# Patient Record
Sex: Male | Born: 1965 | Race: Black or African American | Hispanic: No | Marital: Single | Smoking: Current every day smoker
Health system: Southern US, Community
[De-identification: ages and names within clinical notes are randomized; demographics above are authoritative.]

## PROBLEM LIST (undated history)

## (undated) DIAGNOSIS — N189 Chronic kidney disease, unspecified: Secondary | ICD-10-CM

## (undated) DIAGNOSIS — F329 Major depressive disorder, single episode, unspecified: Secondary | ICD-10-CM

## (undated) DIAGNOSIS — IMO0001 Reserved for inherently not codable concepts without codable children: Secondary | ICD-10-CM

## (undated) DIAGNOSIS — D496 Neoplasm of unspecified behavior of brain: Secondary | ICD-10-CM

## (undated) DIAGNOSIS — G4730 Sleep apnea, unspecified: Secondary | ICD-10-CM

## (undated) DIAGNOSIS — J45909 Unspecified asthma, uncomplicated: Secondary | ICD-10-CM

## (undated) DIAGNOSIS — E785 Hyperlipidemia, unspecified: Secondary | ICD-10-CM

## (undated) DIAGNOSIS — K279 Peptic ulcer, site unspecified, unspecified as acute or chronic, without hemorrhage or perforation: Secondary | ICD-10-CM

## (undated) DIAGNOSIS — F32A Depression, unspecified: Secondary | ICD-10-CM

## (undated) DIAGNOSIS — S0990XA Unspecified injury of head, initial encounter: Secondary | ICD-10-CM

## (undated) DIAGNOSIS — C801 Malignant (primary) neoplasm, unspecified: Secondary | ICD-10-CM

## (undated) DIAGNOSIS — K219 Gastro-esophageal reflux disease without esophagitis: Secondary | ICD-10-CM

## (undated) DIAGNOSIS — K5900 Constipation, unspecified: Secondary | ICD-10-CM

## (undated) DIAGNOSIS — F1910 Other psychoactive substance abuse, uncomplicated: Secondary | ICD-10-CM

## (undated) DIAGNOSIS — M1990 Unspecified osteoarthritis, unspecified site: Secondary | ICD-10-CM

## (undated) DIAGNOSIS — I639 Cerebral infarction, unspecified: Secondary | ICD-10-CM

## (undated) DIAGNOSIS — R569 Unspecified convulsions: Secondary | ICD-10-CM

## (undated) DIAGNOSIS — D573 Sickle-cell trait: Secondary | ICD-10-CM

## (undated) DIAGNOSIS — F419 Anxiety disorder, unspecified: Secondary | ICD-10-CM

## (undated) DIAGNOSIS — R519 Headache, unspecified: Secondary | ICD-10-CM

## (undated) HISTORY — DX: Headache, unspecified: R51.9

## (undated) HISTORY — DX: Unspecified convulsions: R56.9

## (undated) HISTORY — DX: Cerebral infarction, unspecified: I63.9

## (undated) HISTORY — DX: Sleep apnea, unspecified: G47.30

## (undated) HISTORY — DX: Hyperlipidemia, unspecified: E78.5

## (undated) HISTORY — PX: DIAGNOSTIC LAPAROSCOPY: SUR761

## (undated) HISTORY — PX: ESOPHAGOGASTRODUODENOSCOPY ENDOSCOPY: SHX5814

## (undated) HISTORY — DX: Chronic kidney disease, unspecified: N18.9

## (undated) HISTORY — DX: Sickle-cell trait: D57.3

## (undated) HISTORY — DX: Gastro-esophageal reflux disease without esophagitis: K21.9

## (undated) HISTORY — DX: Other psychoactive substance abuse, uncomplicated: F19.10

## (undated) HISTORY — DX: Unspecified osteoarthritis, unspecified site: M19.90

## (undated) HISTORY — DX: Malignant (primary) neoplasm, unspecified: C80.1

---

## 2003-06-16 ENCOUNTER — Inpatient Hospital Stay (HOSPITAL_COMMUNITY): Admission: EM | Admit: 2003-06-16 | Discharge: 2003-06-21 | Payer: Self-pay | Admitting: Psychiatry

## 2004-12-03 ENCOUNTER — Ambulatory Visit: Payer: Self-pay | Admitting: *Deleted

## 2004-12-03 ENCOUNTER — Ambulatory Visit: Payer: Self-pay | Admitting: Nurse Practitioner

## 2004-12-17 ENCOUNTER — Ambulatory Visit: Payer: Self-pay | Admitting: Nurse Practitioner

## 2004-12-31 ENCOUNTER — Ambulatory Visit (HOSPITAL_COMMUNITY): Admission: RE | Admit: 2004-12-31 | Discharge: 2004-12-31 | Payer: Self-pay | Admitting: Internal Medicine

## 2004-12-31 ENCOUNTER — Ambulatory Visit: Payer: Self-pay | Admitting: Nurse Practitioner

## 2005-01-14 ENCOUNTER — Ambulatory Visit: Payer: Self-pay | Admitting: Nurse Practitioner

## 2007-11-25 ENCOUNTER — Emergency Department (HOSPITAL_COMMUNITY): Admission: EM | Admit: 2007-11-25 | Discharge: 2007-11-25 | Payer: Self-pay | Admitting: Emergency Medicine

## 2009-10-22 ENCOUNTER — Emergency Department (HOSPITAL_COMMUNITY): Admission: EM | Admit: 2009-10-22 | Discharge: 2009-10-22 | Payer: Self-pay | Admitting: Emergency Medicine
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
## 2011-03-19 NOTE — H&P (Signed)
NAME:  Jeremy Soto, Jeremy Soto                         ACCOUNT NO.:  [id number]   MEDICAL RECORD NO.:  [id number]                   PATIENT TYPE:  IPS   LOCATION:  0303                                 FACILITY:  [facility]   PHYSICIAN:  Geoffery Lyons, M.D.                   DATE OF BIRTH:  1966/07/10   DATE OF ADMISSION:  06/16/2003  DATE OF DISCHARGE:                         PSYCHIATRIC ADMISSION ASSESSMENT   IDENTIFYING INFORMATION:  This is a 45 year old African-American male  voluntarily admitted on June 16, 2003.   HISTORY OF PRESENT ILLNESS:  The patient presents with a history of  substance abuse, having homicidal thoughts towards someone that was  disrespecting his home.  He states he got very angry.  He states he does not  like any of this bullshit.  He works nights.  He states he drinks the  moment he gets home, smoking joints and doing cocaine.  He feels very angry,  having occasional positive auditory hallucinations that sometimes settle him  down.  He states that they talk about the Bible.  He states he has not been  sleeping.  He has lost over 20 pounds.  He denies any current psychosis and  denies any suicidal ideation.   PAST PSYCHIATRIC HISTORY:  First hospitalization to [facility].  No other psychiatric admissions.  No outpatient treatment.  No  history of alcohol detox in the past.  No prior suicide attempt.   SOCIAL HISTORY:  This is a 45 year old divorced African-American male.  He  has been divorced for eight months.  This was his first marriage.  He has  two girls, ages 50 and 43.  His 45 year old is currently with the biological  mother.  He lives alone.  He works at [employer].  No legal problems.   FAMILY HISTORY:  [name], he states, used to talk to himself.   ALCOHOL/DRUG HISTORY:  The patient smokes.  He drinks beer and liquor.  His  last drink was on Sunday.  No blackouts.  He did smoke marijuana and used  cocaine in the past.   PRIMARY  CARE PHYSICIAN:  None.   MEDICAL PROBLEMS:  None.   MEDICATIONS:  None.   ALLERGIES:  No known allergies.   PHYSICAL EXAMINATION:  On the chart.  The patient is a well-developed, well-  nourished male without any complaints.  He is in general good health.  Vital  signs are stable.   LABORATORY DATA:  CBC within normal limits.  CMET within normal limits.   MENTAL STATUS EXAM:  He is an alert, well-built male.  Cooperative with good  eye contact.  Speech is clear.  Mood is angry and agitated. Affect is  constricted.  Thought processes are positive auditory hallucinations,  although none currently.  Does not appear to be responding to internal  stimuli.  Positive homicidal ideation.  No suicidal ideation.  No delusions  or  flight of ideas.  Some questionable religious preoccupation.  Cognitive  function intact.  Memory is fair.  Judgment and insight are poor.  Poor  impulse control.   DIAGNOSES:   AXIS I:  1. Depression not otherwise specified.  2. Polysubstance abuse.  3. Rule out bipolar disorder.  4. Rule out substance-induced mood disorder.   AXIS II:  Deferred.   AXIS III:  None.   AXIS IV:  Psychosocial problems related to drug use.   AXIS V:  Current 25; past year 49.   PLAN:  Voluntary admission for polysubstance abuse.  Contract for safety.  Check every 15 minutes.  Will stabilize mood and thinking so patient and  others can be safe.  Will initiate Depakote as a mood stabilizer.  Seroquel  for anxiety control, augmenting Depakote for mood stabilization and  psychotic symptoms.  Will have phenobarbital to detox safely from alcohol.  The patient is to remain alcohol and drug-free, to follow up with mental  health and to be medication-compliant.   TENTATIVE LENGTH OF STAY:  Three to five days or more depending on patient's  response to medication.     Landry Corporal, N.P.                       Geoffery Lyons, M.D.    JO/MEDQ  D:  06/21/2003  T:  06/22/2003  Job:   [id number]

## 2012-06-27 ENCOUNTER — Encounter (HOSPITAL_COMMUNITY): Payer: Self-pay | Admitting: Physical Medicine and Rehabilitation

## 2012-06-27 ENCOUNTER — Emergency Department (HOSPITAL_COMMUNITY)
Admission: EM | Admit: 2012-06-27 | Discharge: 2012-06-27 | Disposition: A | Discharge status: Home / Self Care | Payer: BC Managed Care – PPO | Attending: Emergency Medicine | Admitting: Emergency Medicine

## 2012-06-27 DIAGNOSIS — S0100XA Unspecified open wound of scalp, initial encounter: Secondary | ICD-10-CM | POA: Insufficient documentation

## 2012-06-27 DIAGNOSIS — Z23 Encounter for immunization: Secondary | ICD-10-CM | POA: Insufficient documentation

## 2012-06-27 DIAGNOSIS — W298XXA Contact with other powered powered hand tools and household machinery, initial encounter: Secondary | ICD-10-CM | POA: Insufficient documentation

## 2012-06-27 DIAGNOSIS — S0101XA Laceration without foreign body of scalp, initial encounter: Secondary | ICD-10-CM

## 2012-06-27 MED ORDER — OXYCODONE-ACETAMINOPHEN 5-325 MG PO TABS
1.0000 | ORAL_TABLET | Freq: Once | ORAL | Status: AC
  Administered 2012-06-27: 1 via ORAL
  Filled 2012-06-27: qty 1

## 2012-06-27 MED ORDER — HYDROCODONE-ACETAMINOPHEN 5-325 MG PO TABS: 1.0000 | ORAL_TABLET | Freq: Four times a day (QID) | ORAL | Status: DC | PRN

## 2012-06-27 MED ORDER — TETANUS-DIPHTH-ACELL PERTUSSIS 5-2.5-18.5 LF-MCG/0.5 IM SUSP
0.5000 mL | Freq: Once | INTRAMUSCULAR | Status: AC
  Administered 2012-06-27: 0.5 mL via INTRAMUSCULAR
  Filled 2012-06-27: qty 0.5

## 2012-06-27 NOTE — ED Provider Notes (Signed)
I saw and evaluated the patient, reviewed the resident's note and I agree with the findings and plan.   Naketa Daddario, MD 06/27/12 1631 
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
## 2012-06-27 NOTE — ED Notes (Signed)
Resident at the bedside to place staples. Pt tolerated without difficulty.

## 2012-07-05 ENCOUNTER — Encounter (HOSPITAL_COMMUNITY): Payer: Self-pay | Admitting: *Deleted

## 2012-07-05 ENCOUNTER — Emergency Department (HOSPITAL_COMMUNITY)
Admission: EM | Admit: 2012-07-05 | Discharge: 2012-07-05 | Disposition: A | Discharge status: Home / Self Care | Payer: BC Managed Care – PPO | Attending: Emergency Medicine | Admitting: Emergency Medicine

## 2012-07-05 DIAGNOSIS — Z4802 Encounter for removal of sutures: Secondary | ICD-10-CM | POA: Insufficient documentation

## 2012-07-05 NOTE — ED Provider Notes (Signed)
History     CSN: [id number]  Arrival date & time 07/05/12  1851   First MD Initiated Contact with Patient 07/05/12 2057      Chief Complaint  Patient presents with  . Suture / Staple Removal    (Consider location/radiation/quality/duration/timing/severity/associated sxs/prior treatment) Patient is a 46 y.o. male presenting with suture removal. The history is provided by the patient.  Suture / Staple Removal  The sutures were placed 7 to 10 days ago. There has been no treatment since the wound repair. Fever duration: afebrile. There has been no drainage from the wound. There is no redness present. There is no swelling present. The pain has no pain.    History reviewed. No pertinent past medical history.  History reviewed. No pertinent past surgical history.  History reviewed. No pertinent family history.  History  Substance Use Topics  . Smoking status: Never Smoker   . Smokeless tobacco: Not on file  . Alcohol Use: Yes     occassional      Review of Systems  Constitutional: Negative for fever, diaphoresis and activity change.  HENT: Negative for congestion and neck pain.   Respiratory: Negative for cough.   Genitourinary: Negative for dysuria.  Musculoskeletal: Negative for myalgias.  Skin: Negative for color change and wound.  Neurological: Negative for headaches.  All other systems reviewed and are negative.    Allergies  Review of patient's allergies indicates no known allergies.  Home Medications   Current Outpatient Rx  Name Route Sig Dispense Refill  . VITAMIN C PO Oral Take 1 tablet by mouth daily.    . OMEGA-3 FATTY ACIDS 1000 MG PO CAPS Oral Take 1 g by mouth daily.    Marland Kitchen HYDROCODONE-ACETAMINOPHEN 5-325 MG PO TABS Oral Take 1 tablet by mouth every 6 (six) hours as needed. For pain    . VITAMIN E PO Oral Take 1 capsule by mouth daily.      BP 131/80  Pulse 73  Temp 98.5 F (36.9 C) (Oral)  Resp 16  SpO2 100%  Physical Exam  Constitutional: He  appears well-developed and well-nourished. No distress.  HENT:  Head: Normocephalic.  Eyes: Conjunctivae and EOM are normal. Pupils are equal, round, and reactive to light.  Neck: Normal range of motion. Neck supple.  Cardiovascular: Normal rate.        Intact distal pulses  Pulmonary/Chest: Effort normal.  Skin: Skin is dry. He is not diaphoretic.       Wound healing appropriately with good approximation. No surrounding warmth or erythema. No purulent drainage. Mild ttp along scar line.    ED Course  SUTURE REMOVAL Date/Time: 07/05/2012 9:08 PM Performed by: Jaci Carrel Authorized by: Jaci Carrel Consent: Verbal consent obtained. Written consent obtained. Risks and benefits: risks, benefits and alternatives were discussed Consent given by: patient Patient understanding: patient states understanding of the procedure being performed Patient consent: the patient's understanding of the procedure matches consent given Patient identity confirmed: verbally with patient Body area: head/neck Location details: scalp Wound Appearance: clean Staples Removed: 7 Post-removal: antibiotic ointment applied Facility: sutures placed in this facility Patient tolerance: Patient tolerated the procedure well with no immediate complications.   (including critical care time)  Labs Reviewed - No data to display No results found.   No diagnosis found.    MDM  Staple removal   Pt to ER for staple/suture removal and wound check as above. Procedure tolerated well. Vitals normal, no signs of infection. Scar minimization & return precautions  given at dc.         Jaci Carrel, [place] 07/05/12 2109
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
## 2012-07-06 NOTE — ED Provider Notes (Signed)
Medical screening examination/treatment/procedure(s) were performed by non-physician practitioner and as supervising physician I was immediately available for consultation/collaboration.  Jones Skene, M.D.     Jones Skene, MD 07/06/12 1236

## 2012-09-11 ENCOUNTER — Ambulatory Visit (INDEPENDENT_AMBULATORY_CARE_PROVIDER_SITE_OTHER): Payer: BC Managed Care – PPO | Admitting: Emergency Medicine

## 2012-09-11 VITALS — BP 152/78 | HR 79 | Temp 98.5°F | Resp 18 | Ht 74.0 in | Wt 223.0 lb

## 2012-09-11 DIAGNOSIS — S335XXA Sprain of ligaments of lumbar spine, initial encounter: Secondary | ICD-10-CM

## 2012-09-11 MED ORDER — CYCLOBENZAPRINE HCL 10 MG PO TABS: 10.0000 mg | ORAL_TABLET | Freq: Three times a day (TID) | ORAL | Status: DC | PRN

## 2012-09-11 MED ORDER — NAPROXEN SODIUM 550 MG PO TABS: 550.0000 mg | ORAL_TABLET | Freq: Two times a day (BID) | ORAL | Status: DC

## 2012-09-11 MED ORDER — TRAMADOL HCL 50 MG PO TABS: 50.0000 mg | ORAL_TABLET | Freq: Four times a day (QID) | ORAL | Status: DC | PRN
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
## 2012-09-12 ENCOUNTER — Telehealth: Payer: Self-pay

## 2012-09-12 NOTE — Telephone Encounter (Signed)
Pt lost his rtw notice and needs to have another rtw note written but would like to see if dr can write note to out till Thursday 09-14-12 he wasn't able to make it to the er and its hard finding a ride, he states he needs his job and is at the bus station now waiting to get on bus. Pt would like a call when note is ready for pick-up. [phone]

## 2012-09-14 NOTE — Progress Notes (Signed)
Reviewed and agree.

## 2013-04-03 ENCOUNTER — Encounter: Payer: Self-pay | Admitting: Gastroenterology

## 2013-04-12 ENCOUNTER — Ambulatory Visit: Payer: BC Managed Care – PPO | Admitting: Gastroenterology

## 2013-04-12 ENCOUNTER — Ambulatory Visit: Payer: BC Managed Care – PPO

## 2013-04-13 ENCOUNTER — Ambulatory Visit: Payer: BC Managed Care – PPO | Attending: Family Medicine | Admitting: Family Medicine

## 2013-04-13 VITALS — BP 119/76 | HR 62 | Temp 98.8°F | Resp 17 | Wt 212.0 lb

## 2013-04-13 DIAGNOSIS — K219 Gastro-esophageal reflux disease without esophagitis: Secondary | ICD-10-CM | POA: Diagnosis not present

## 2013-04-13 DIAGNOSIS — R109 Unspecified abdominal pain: Secondary | ICD-10-CM | POA: Insufficient documentation

## 2013-04-13 DIAGNOSIS — R1013 Epigastric pain: Secondary | ICD-10-CM | POA: Diagnosis not present

## 2013-04-13 DIAGNOSIS — K279 Peptic ulcer, site unspecified, unspecified as acute or chronic, without hemorrhage or perforation: Secondary | ICD-10-CM | POA: Diagnosis not present

## 2013-04-13 LAB — CBC WITH DIFFERENTIAL/PLATELET
Basophils Absolute: 0 10*3/uL (ref 0.0–0.1)
HCT: 37.1 % — ABNORMAL LOW (ref 39.0–52.0)
Lymphs Abs: 1.7 10*3/uL (ref 0.7–4.0)
MCH: 30.4 pg (ref 26.0–34.0)
MCHC: 34.8 g/dL (ref 30.0–36.0)
Neutrophils Relative %: 49 % (ref 43–77)
RBC: 4.24 MIL/uL (ref 4.22–5.81)

## 2013-04-13 LAB — COMPREHENSIVE METABOLIC PANEL
Alkaline Phosphatase: 71 U/L (ref 39–117)
BUN: 12 mg/dL (ref 6–23)
CO2: 28 mEq/L (ref 19–32)
Calcium: 9.4 mg/dL (ref 8.4–10.5)
Chloride: 106 mEq/L (ref 96–112)
Creat: 1.5 mg/dL — ABNORMAL HIGH (ref 0.50–1.35)
Potassium: 4 mEq/L (ref 3.5–5.3)
Sodium: 141 mEq/L (ref 135–145)
Total Bilirubin: 0.8 mg/dL (ref 0.3–1.2)

## 2013-04-13 NOTE — Progress Notes (Signed)
Patient complains of having stomach pain Was seen June 5th  At [facility] Was given a prescription for amoxicillin and biaxin which he has not yet gotten Filled.  Has a GI appt scheduled for June 19th

## 2013-04-13 NOTE — Patient Instructions (Signed)
Ulcer Disease You have an ulcer. This may be in your stomach (gastric ulcer) or in the first part of your small bowel, the duodenum (duodenal ulcer). An ulcer is a break in the lining of the stomach or duodenum. The ulcer causes erosion into the deeper tissue. CAUSES  The stomach has a lining to protect itself from the acid that digests food. The lining can be damaged in two main ways:  The Helico Pylori bacteria (H. Pyolori) can infect the lining of the stomach and cause ulcers.  Nonsteroidal, anti-inflammatory medications (NSAIDS) can cause gastric ulcerations.  Smoking tobacco can increase the acid in the stomach. This can lead to ulcers, and will impair healing of ulcers. Other factors, such as alcohol use and stress may contribute to ulcer formation. Rarely, a tumor or cancer can cause an ulcer.  SYMPTOMS  The problems (symptoms) of ulcer disease are usually a burning or gnawing of the mid-upper belly (abdomen). This is often worse on an empty stomach and may get better with food. This may be associated with feeling sick to your stomach (nausea), bloating, and vomiting. If the ulcer results in bleeding, it can cause:  Black, tarry stools.  Vomiting of bright red blood.  Vomiting coffee-ground-looking materials. With severe bleeding, there may be loss of consciousness and shock.  DIAGNOSIS  Learning what is wrong (diagnosis) is usually made based upon your history and an exam. Medications are so effective that further tests may not be necessary. If needed, other tests may include:  Blood tests, x-rays, or heart tests. These are done to be sure that no other conditions are causing your symptoms.  X-rays (Barium studies) such as an upper GI series.  Most commonly, an upper GI endoscopy can confirm your diagnosis. This is when a flexible tube is passed through the mouth (using sedative medication). The tube is used to look at the inside of esophagus, stomach, and small bowel. Abnormal  pieces of tissue may be removed to examine under the microscope (biopsy). TREATMENT  Bleeding from ulcers can usually be treated via endoscopy. Rarely, surgery is needed for ulcers when bleeding cannot be stopped. Surgery is needed if the ulcer goes through the wall of the stomach or duodenum.  After any bleeding is stopped, medications are the main treatment:  If your ulcer was caused by the bacteria H. Pylori, then you will need antibiotics to kill the infection. Usually, a program involving more than one antibiotic, and a medicine for stomach acid, is prescribed.  Medicine to decrease acid production is used for almost all ulcers. Your caregiver will make a recommendation. They will also tell you how long you must use the medication.  Stop using any medications or substances that may have contributed to your ulcer (alcohol, tobacco, caffeine, for example).  Medications are available that protect the lining of the bowel. HOME CARE INSTRUCTIONS   Continue regular work and usual activities unless advised otherwise by your caregiver.  Avoid tobacco, alcohol, and caffeine. Tobacco use will decrease and slow the rates of healing.  Avoid foods that seem to aggravate or cause discomfort.  There are many over-the-counter products available to control stomach acid and other symptoms. Discuss these with your caregiver before using them. DO NOT substitute over-the-counter medications for prescription medications without discussing with your caregiver.  Special diets are not usually needed.  Keep any follow-up appointments and blood tests, as directed. SEEK MEDICAL CARE IF:   Your pain or other ulcer symptoms do not improve within a  few days of starting treatment.  You develop diarrhea. This can be a complication of certain treatments.  You have ongoing indigestion or heartburn, even if your main ulcer symptoms are improved. SEEK IMMEDIATE MEDICAL CARE IF:   You develop bright red, rectal  bleeding; dark black, tarry stools; or vomit blood.  You become light-headed, weak, have fainting episodes, or become sweaty, cold and clammy.  You experience severe abdominal pain not controlled by medications. DO NOT take pain medications unless ordered by your caregiver. Document Released: 07/28/2005 Document Revised: 01/10/2012 Document Reviewed: 06/15/2007 [facility] Patient Information 2013 [place], [place]. Diet for Gastroesophageal Reflux Disease, Adult Reflux is when stomach acid flows up into the esophagus. The esophagus becomes irritated and sore (inflammation). When reflux happens often and is severe, it is called gastroesophageal reflux disease (GERD). What you eat can help ease any discomfort caused by GERD. FOODS OR DRINKS TO AVOID OR LIMIT  Coffee and black tea, with or without caffeine.  Bubbly (carbonated) drinks with caffeine or energy drinks.  Strong spices, such as pepper, cayenne pepper, curry, or chili powder.  Peppermint or spearmint.  Chocolate.  High-fat foods, such as meats, fried food, oils, butter, or nuts.  Fruits and vegetables that cause discomfort. This includes citrus fruits and tomatoes.  Alcohol. If a certain food or drink irritates your GERD, avoid eating or drinking it. THINGS THAT MAY HELP GERD INCLUDE:  Eat meals slowly.  Eat 5 to 6 small meals a day, not 3 large meals.  Do not eat food for a certain amount of time if it causes discomfort.  Wait 3 hours after eating before lying down.  Keep the head of your bed raised 6 to 9 inches (15 23 centimeters). Put a foam wedge or blocks under the legs of the bed.  Stay active. Weight loss, if needed, may help ease your discomfort.  Wear loose-fitting clothing.  Do not smoke or chew tobacco. Document Released: 04/18/2012 Document Reviewed: 04/18/2012 [facility] Patient Information 2014 [place], [place].

## 2013-04-13 NOTE — Progress Notes (Signed)
Patient ID: Jeremy Soto, male   DOB: 28-Jun-1966, 47 y.o.   MRN: [id number]  CC: establish  HPI: Pt reports 1 month of worsening abd pain, nausea and diarrhea, sour taste in mouth He saw urgent care who started him on omerazole.  He is scheduled to see GI next week.  Pt says he has lost weight and not eating well because of pain with eating.   Abd bloating has been present. No Cp, no SOB  No Known Allergies Past Medical History  Diagnosis Date  . GERD (gastroesophageal reflux disease)    Current Outpatient Prescriptions on File Prior to Visit  Medication Sig Dispense Refill  . Ascorbic Acid (VITAMIN C PO) Take 1 tablet by mouth daily.      . cyclobenzaprine (FLEXERIL) 10 MG tablet Take 1 tablet (10 mg total) by mouth 3 (three) times daily as needed for muscle spasms.  30 tablet  0  . fish oil-omega-3 fatty acids 1000 MG capsule Take 1 g by mouth daily.      Marland Kitchen HYDROcodone-acetaminophen (NORCO/VICODIN) 5-325 MG per tablet Take 1 tablet by mouth every 6 (six) hours as needed. For pain      . Loperamide-Simethicone (IMODIUM ADVANCED) 2-125 MG TABS Take by mouth.      . naproxen sodium (ANAPROX DS) 550 MG tablet Take 1 tablet (550 mg total) by mouth 2 (two) times daily with a meal.  40 tablet  0  . omeprazole (PRILOSEC) 20 MG capsule Take 20 mg by mouth 2 (two) times daily.      . ondansetron (ZOFRAN) 8 MG tablet Take by mouth every 8 (eight) hours as needed for nausea.      . traMADol (ULTRAM) 50 MG tablet Take 1-2 tablets (50-100 mg total) by mouth every 6 (six) hours as needed for pain.  50 tablet  0  . VITAMIN E PO Take 1 capsule by mouth daily.       No current facility-administered medications on file prior to visit.   Family History  Problem Relation Age of Onset  . Diabetes Maternal Grandmother   . Diabetes Paternal Grandfather   . Hypertension Paternal Grandfather   . Hypertension Father   . Hypertension Mother    History   Social History  . Marital Status: Single     Spouse Name: N/A    Number of Children: N/A  . Years of Education: N/A   Occupational History  . Not on file.   Social History Main Topics  . Smoking status: Current Every Day Smoker  . Smokeless tobacco: Never Used     Comment: 1/2 pack daily  . Alcohol Use: 0.0 oz/week     Comment: 1-2 drinks per day   . Drug Use: No  . Sexually Active: Not on file   Other Topics Concern  . Not on file   Social History Narrative  . No narrative on file    Review of Systems  Constitutional: Negative for fever, chills, diaphoresis, activity change, appetite change and fatigue.  HENT: Negative for ear pain, nosebleeds, congestion, facial swelling, rhinorrhea, neck pain, neck stiffness and ear discharge.   Eyes: Negative for pain, discharge, redness, itching and visual disturbance.  Respiratory: Negative for cough, choking, chest tightness, shortness of breath, wheezing and stridor.   Cardiovascular: Negative for chest pain, palpitations and leg swelling.  Gastrointestinal: Negative for abdominal distention.  Genitourinary: Negative for dysuria, urgency, frequency, hematuria, flank pain, decreased urine volume, difficulty urinating and dyspareunia.  Musculoskeletal: Negative for  back pain, joint swelling, arthralgias and gait problem.  Neurological: Negative for dizziness, tremors, seizures, syncope, facial asymmetry, speech difficulty, weakness, light-headedness, numbness and headaches.  Hematological: Negative for adenopathy. Does not bruise/bleed easily.  Psychiatric/Behavioral: Negative for hallucinations, behavioral problems, confusion, dysphoric mood, decreased concentration and agitation.    Objective:   Filed Vitals:   04/13/13 1201  BP: 119/76  Pulse: 62  Temp: 98.8 F (37.1 C)  Resp: 17    Physical Exam  Constitutional: Appears well-developed and well-nourished. No distress.  HENT: Normocephalic. External right and left ear normal. Oropharynx is clear and moist.  Eyes:  Conjunctivae and EOM are normal. PERRLA, no scleral icterus.  Neck: Normal ROM. Neck supple. No JVD. No tracheal deviation. No thyromegaly.  CVS: RRR, S1/S2 +, no murmurs, no gallops, no carotid bruit.  Pulmonary: Effort and breath sounds normal, no stridor, rhonchi, wheezes, rales.  Abdominal: Soft. BS +,  no distension, tenderness, rebound or guarding.  Musculoskeletal: Normal range of motion. No edema and no tenderness.  Lymphadenopathy: No lymphadenopathy noted, cervical, inguinal. Neuro: Alert. Normal reflexes, muscle tone coordination. No cranial nerve deficit. Skin: Skin is warm and dry. No rash noted. Not diaphoretic. No erythema. No pallor.  Psychiatric: Normal mood and affect. Behavior, judgment, thought content normal.   No results found for this basename: WBC, HGB, HCT, MCV, PLT   No results found for this basename: CREATININE, BUN, NA, K, CL, CO2    No results found for this basename: HGBA1C   Lipid Panel  No results found for this basename: chol, trig, hdl, cholhdl, vldl, ldlcalc      Assessment and plan:   Patient Active Problem List   Diagnosis Date Noted  . Peptic ulcer disease 04/13/2013  . GERD (gastroesophageal reflux disease) 04/13/2013  . Abdominal pain, epigastric 04/13/2013   Xray of abdomen ordered GI referral for June 19 already scheduled GERD diet recommended  The patient was counseled on the dangers of tobacco use, and was advised to quit.  Reviewed strategies to maximize success, including removing cigarettes and smoking materials from environment and stress management  STOP ALL NSAIDS recommended  Follow up in 3 weeks  The patient was given clear instructions to go to ER or return to medical center if symptoms don't improve, worsen or new problems develop.  The patient verbalized understanding.  The patient was told to call to get lab results if they haven't heard anything in the next week.    Rodney Langton, MD, CDE, FAAFP [facility] [place], [place]  .

## 2013-04-18 ENCOUNTER — Telehealth: Payer: Self-pay | Admitting: *Deleted

## 2013-04-18 NOTE — Telephone Encounter (Signed)
04/18/13 Spoke with patient regarding lad results mild anemia already seeing GI doctor Has slightly high creatinine . Patient stated he not aware of any kidney problems will Discuss with doctor on next f/u appointment. P.Bo Teicher,RN BSN [facility]

## 2013-04-19 ENCOUNTER — Encounter: Payer: Self-pay | Admitting: Gastroenterology

## 2013-04-19 ENCOUNTER — Ambulatory Visit (INDEPENDENT_AMBULATORY_CARE_PROVIDER_SITE_OTHER): Payer: BC Managed Care – PPO | Admitting: Gastroenterology

## 2013-04-19 VITALS — BP 106/76 | HR 80 | Ht 73.0 in | Wt 210.4 lb

## 2013-04-19 DIAGNOSIS — A048 Other specified bacterial intestinal infections: Secondary | ICD-10-CM

## 2013-04-19 DIAGNOSIS — R112 Nausea with vomiting, unspecified: Secondary | ICD-10-CM

## 2013-04-19 DIAGNOSIS — R1013 Epigastric pain: Secondary | ICD-10-CM

## 2013-04-19 MED ORDER — AMOXICILL-CLARITHRO-LANSOPRAZ PO MISC: ORAL | Status: DC

## 2013-04-19 MED ORDER — ONDANSETRON HCL 8 MG PO TABS: 8.0000 mg | ORAL_TABLET | Freq: Two times a day (BID) | ORAL | Status: DC | PRN

## 2013-04-19 NOTE — Progress Notes (Signed)
History of Present Illness:  This is a previously healthy 47 year old African American male who now describes several weeks of epigastric discomfort, nausea vomiting, and some loose stooling without melena or hematochezia.  He uses beer rather heavily but does not have hx. of previous episodes of hepatitis or pancreatitis.  His epigastric pain is made worse with eating and his nausea and emesis persist despite use of Zofran and PPI medications.  Lab data is reviewed and is all normal, but he does have a positive H. pylori serology.  He drinks beer heavily and daily but denies alcoholism.  He currently is on hydrocodone, Flexeril, and imodium, Anaprox, Prilosec, Zofran and Ultram.  I cannot see on reviewing his records where he has hadprevious endoscopy or ultrasound exams.  He denies abuse of other NSAIDs.  There is no history of fever, chills, skin rashes, joint pains, or specific hepatobiliary complaints.  He complains of dizziness and lightheadedness with periods of nausea and vomiting.  He has not had previous abdominal surgery.  I have reviewed this patient's present history, medical and surgical past history, allergies and medications.     ROS:   All systems were reviewed and are negative unless otherwise stated in the HPI.    Physical Exam: Blood pressure 106/76, pulse 80 and regular and weight 210 with a BMI of 27.76.  He is a healthy-appearing male in no distress who does not appear ill. General well developed well nourished patient in no acute distress, appearing their stated age Eyes PERRLA, no icterus, fundoscopic exam per opthamologist Skin no lesions noted Neck supple, no adenopathy, no thyroid enlargement, no tenderness Chest clear to percussion and auscultation Heart no significant murmurs, gallops or rubs noted Abdomen no hepatosplenomegaly masses or tenderness, BS normal.  Mild tenderness in epigastric area to deep palpation.  There is no rebound tenderness and bowel sounds are  normal. Rectal inspection normal no fissures, or fistulae noted.  No masses or tenderness on digital exam. Stool guaiac negative. Extremities no acute joint lesions, edema, phlebitis or evidence of cellulitis. Neurologic patient oriented x 3, cranial nerves intact, no focal neurologic deficits noted. Psychological mental status normal and normal affect.  Assessment and plan: Probable acute H. pylori infection and peptic ulcer disease.  I placed him on a Prevpac for 10 days, we'll continue when necessary Zofran, have scheduled him for endoscopy as soon as possible.  He is been placed on a gastroparesis diet as tolerated.  His previous primary care physicians had ordered numerous stool exams which were not completed.  I've asked him to discontinue alcohol if at all possible while he is sick.  Lab review shows a normal amylase, lipase, and liver profile.  Patient relates that he was allegedly told he had abnormal liver function tests, but review of his labs do not show any abnormal liver enzymes.  His workup is otherwise negative we will proceed with upper abdominal ultrasound exam of his gallbladder and liver.  Encounter Diagnoses  Name Primary?  . Nausea alone Yes  . H. pylori infection

## 2013-04-19 NOTE — Patient Instructions (Addendum)
  You have been given a separate informational sheet regarding your tobacco use, the importance of quitting and local resources to help you quit.   You have been scheduled for an endoscopy with propofol. Please follow written instructions given to you at your visit today. If you use inhalers (even only as needed), please bring them with you on the day of your procedure. Your physician has requested that you go to www.startemmi.com and enter the access code given to you at your visit today. This web site gives a general overview about your procedure. However, you should still follow specific instructions given to you by our office regarding your preparation for the procedure.  We have sent the following medications to your pharmacy for you to pick up at your convenience: Prevpac, please take as directed on package Zofran 8 mg, please take every 12 hours as needed for nausea  Information on Gastroparesis was given today  _________________________________________________________________                                               We are excited to introduce MyChart, a new best-in-class service that provides you online access to important information in your electronic medical record. We want to make it easier for you to view your health information - all in one secure location - when and where you need it. We expect MyChart will enhance the quality of care and service we provide.  When you register for MyChart, you can:    View your test results.    Request appointments and receive appointment reminders via email.    Request medication renewals.    View your medical history, allergies, medications and immunizations.    Communicate with your physician's office through a password-protected site.    Conveniently print information such as your medication lists.  To find out if MyChart is right for you, please talk to a member of our clinical staff today. We will gladly answer your  questions about this free health and wellness tool.  If you are age 47 or older and want a member of your family to have access to your record, you must provide written consent by completing a proxy form available at our office. Please speak to our clinical staff about guidelines regarding accounts for patients younger than age 44.  As you activate your MyChart account and need any technical assistance, please call the MyChart technical support line at [phone]-[phone]) or email your question to [email or web address]. If you email your question(s), please include your name, a return phone number and the best time to reach you.  If you have non-urgent health-related questions, you can send a message to our office through MyChart at [email or web address]. If you have a medical emergency, call 911.  Thank you for using MyChart as your new health and wellness resource!   MyChart licensed from [employer],  [id number]. Patents Pending.

## 2013-04-20 ENCOUNTER — Encounter: Payer: Self-pay | Admitting: Gastroenterology

## 2013-04-23 ENCOUNTER — Telehealth: Payer: Self-pay | Admitting: *Deleted

## 2013-04-23 ENCOUNTER — Encounter: Payer: Self-pay | Admitting: Gastroenterology

## 2013-04-23 ENCOUNTER — Ambulatory Visit (AMBULATORY_SURGERY_CENTER): Payer: BC Managed Care – PPO | Admitting: Gastroenterology

## 2013-04-23 VITALS — BP 119/92 | HR 63 | Temp 97.1°F | Resp 23 | Ht 73.0 in | Wt 210.0 lb

## 2013-04-23 DIAGNOSIS — D1330 Benign neoplasm of unspecified part of small intestine: Secondary | ICD-10-CM

## 2013-04-23 DIAGNOSIS — K219 Gastro-esophageal reflux disease without esophagitis: Secondary | ICD-10-CM

## 2013-04-23 DIAGNOSIS — R197 Diarrhea, unspecified: Secondary | ICD-10-CM

## 2013-04-23 DIAGNOSIS — K2980 Duodenitis without bleeding: Secondary | ICD-10-CM

## 2013-04-23 DIAGNOSIS — R112 Nausea with vomiting, unspecified: Secondary | ICD-10-CM

## 2013-04-23 DIAGNOSIS — R1013 Epigastric pain: Secondary | ICD-10-CM

## 2013-04-23 MED ORDER — SODIUM CHLORIDE 0.9 % IV SOLN: 500.0000 mL | INTRAVENOUS | Status: DC

## 2013-04-23 NOTE — Patient Instructions (Addendum)

## 2013-04-23 NOTE — Progress Notes (Signed)
Patient did not have preoperative order for IV antibiotic SSI prophylaxis. (G8918)  Patient did not experience any of the following events: a burn prior to discharge; a fall within the facility; wrong site/side/patient/procedure/implant event; or a hospital transfer or hospital admission upon discharge from the facility. (G8907)  

## 2013-04-23 NOTE — Progress Notes (Signed)
Procedure ends, to recovery, report given and VSS. 

## 2013-04-23 NOTE — Op Note (Signed)
[facility] [address] [place], [address]   ENDOSCOPY PROCEDURE REPORT  PATIENT: Jeremy Soto, Jeremy Soto  MR#: [id number] BIRTHDATE: 26-Sep-1966 , 46  yrs. old GENDER: Male ENDOSCOPIST:Demetrica Zipp Hale Bogus, MD, [facility] REFERRED BY: PROCEDURE DATE:  04/23/2013 PROCEDURE:   EGD w/ biopsy for H.pylori and EGD w/ biopsy ASA CLASS:    Class II INDICATIONS: MEDICATION: propofol (Diprivan) 250mg  IV TOPICAL ANESTHETIC:   Cetacaine Spray  DESCRIPTION OF PROCEDURE:   After the risks and benefits of the procedure were explained, informed consent was obtained.  The LB [id number]  endoscope was introduced through the mouth  and advanced to the second portion of the duodenum .  The instrument was slowly withdrawn as the mucosa was fully examined.    Esophagus: The esophagus is entirely normal throughout its length without any inflammation, ulcerations or strictures.  DUODENUM: Mild duodenal inflammation was found in the duodenal bulb...no discrete ulceration or bleeding lesions noted. additional biopsies done  STOMACH: The mucosa of the stomach appeared normal.  Multiple biopsies were performed.,no ulcer noted .Biopsies done for H.pylori/    Retroflexed views revealed no abnormalities.    The scope was then withdrawn from the patient and the procedure completed.  COMPLICATIONS: There were no complications.   ENDOSCOPIC IMPRESSION: 1.   Duodenal inflammation was found in the duodenal bulb .Marland Kitchen?? healing acute H.pylori infection....rule out cholelithiasis versus functional disorder. 2.   The mucosa of the stomach appeared normal; multiple biopsies done for HP.  RECOMMENDATIONS: 1.  Await biopsy results 2.  Continue current meds 3.  Upper GI Series to be scheduled    _______________________________ eSigned:  Mardella Layman, MD, [facility] 04/23/2013 11:33 AM      PATIENT NAME:  Jeremy Soto, Jeremy Soto MR#: [id number]

## 2013-04-23 NOTE — Telephone Encounter (Signed)
Per Dr Jarold Motto, schedule pt for abd u/s. Scheduled pt for 04/27/13 at 0800am, arrive at 07:45am at [facility] and NPO after midnight. Rosalita Chessman in [facility] will inform the pt.

## 2013-04-24 ENCOUNTER — Telehealth: Payer: Self-pay

## 2013-04-24 ENCOUNTER — Encounter: Payer: Self-pay | Admitting: Gastroenterology

## 2013-04-24 NOTE — Telephone Encounter (Signed)
#  [phone] recording said voice mail box was not set up yet.  I could not leave a message. Maw

## 2013-04-27 ENCOUNTER — Ambulatory Visit (HOSPITAL_COMMUNITY): Payer: BC Managed Care – PPO

## 2013-04-27 ENCOUNTER — Encounter: Payer: Self-pay | Admitting: Gastroenterology

## 2013-05-02 ENCOUNTER — Encounter: Payer: Self-pay | Admitting: Internal Medicine

## 2013-05-02 ENCOUNTER — Ambulatory Visit: Payer: BC Managed Care – PPO | Attending: Family Medicine | Admitting: Internal Medicine

## 2013-05-02 VITALS — BP 106/68 | HR 66 | Temp 98.3°F | Resp 16 | Ht 74.0 in | Wt 211.4 lb

## 2013-05-02 DIAGNOSIS — R109 Unspecified abdominal pain: Secondary | ICD-10-CM

## 2013-05-02 DIAGNOSIS — R1013 Epigastric pain: Secondary | ICD-10-CM

## 2013-05-02 DIAGNOSIS — K219 Gastro-esophageal reflux disease without esophagitis: Secondary | ICD-10-CM | POA: Insufficient documentation

## 2013-05-02 DIAGNOSIS — K279 Peptic ulcer, site unspecified, unspecified as acute or chronic, without hemorrhage or perforation: Secondary | ICD-10-CM | POA: Insufficient documentation

## 2013-05-02 MED ORDER — OMEPRAZOLE 20 MG PO CPDR: 20.0000 mg | DELAYED_RELEASE_CAPSULE | Freq: Two times a day (BID) | ORAL | Status: DC

## 2013-05-02 MED ORDER — PANTOPRAZOLE SODIUM 40 MG PO TBEC: 40.0000 mg | DELAYED_RELEASE_TABLET | Freq: Every day | ORAL | Status: DC

## 2013-05-02 MED ORDER — SACCHAROMYCES BOULARDII 250 MG PO CAPS: 250.0000 mg | ORAL_CAPSULE | Freq: Two times a day (BID) | ORAL | Status: DC

## 2013-05-02 NOTE — Progress Notes (Signed)
CC: Followup  HPI: Patient is 47 year old male who presents to clinic for followup. He reports his symptoms of abdominal pain associated with nausea and occasional vomiting is not much better. He has been taking naproxen as prescribed and explains it is not doing much for him. He denies fevers and chills, no blood and vomiting, no changes in appetite or weight. He denies recent sicknesses or hospitalizations. He denies chest pain or shortness of breath.  No Known Allergies Past Medical History  Diagnosis Date  . GERD (gastroesophageal reflux disease)    Current Outpatient Prescriptions on File Prior to Visit  Medication Sig Dispense Refill  . amoxicillin-clarithromycin-lansoprazole (PREVPAC) combo pack Take by mouth as directed. Follow package directions.  1 kit  0  . Ascorbic Acid (VITAMIN C PO) Take 1 tablet by mouth daily.      . cyclobenzaprine (FLEXERIL) 10 MG tablet Take 1 tablet (10 mg total) by mouth 3 (three) times daily as needed for muscle spasms.  30 tablet  0  . fish oil-omega-3 fatty acids 1000 MG capsule Take 1 g by mouth daily.      Marland Kitchen HYDROcodone-acetaminophen (NORCO/VICODIN) 5-325 MG per tablet Take 1 tablet by mouth every 6 (six) hours as needed. For pain      . Loperamide-Simethicone (IMODIUM ADVANCED) 2-125 MG TABS Take by mouth.      . ondansetron (ZOFRAN) 8 MG tablet Take 1 tablet (8 mg total) by mouth every 12 (twelve) hours as needed for nausea.  30 tablet  1  . traMADol (ULTRAM) 50 MG tablet Take 1-2 tablets (50-100 mg total) by mouth every 6 (six) hours as needed for pain.  50 tablet  0  . VITAMIN E PO Take 1 capsule by mouth daily.       No current facility-administered medications on file prior to visit.   Family History  Problem Relation Age of Onset  . Diabetes Maternal Grandmother   . Diabetes Paternal Grandfather   . Hypertension Paternal Grandfather   . Hypertension Father   . Hypertension Mother    History   Social History  . Marital Status:  Single    Spouse Name: N/A    Number of Children: 2  . Years of Education: N/A   Occupational History  .     Social History Main Topics  . Smoking status: Current Every Day Smoker -- 1.50 packs/day for 10 years    Types: Cigarettes  . Smokeless tobacco: Never Used  . Alcohol Use: 0.0 oz/week     Comment: 1-2 drinks per day   . Drug Use: No  . Sexually Active: Not on file   Other Topics Concern  . Not on file   Social History Narrative  . No narrative on file    Review of Systems  Constitutional: Negative for fever, chills, diaphoresis, activity change, appetite change and fatigue.  HENT: Negative for ear pain, nosebleeds, congestion, facial swelling, rhinorrhea, neck pain, neck stiffness and ear discharge.   Eyes: Negative for pain, discharge, redness, itching and visual disturbance.  Respiratory: Negative for cough, choking, chest tightness, shortness of breath, wheezing and stridor.   Cardiovascular: Negative for chest pain, palpitations and leg swelling.  Gastrointestinal: Negative for abdominal distention.  Genitourinary: Negative for dysuria, urgency, frequency, hematuria, flank pain, decreased urine volume, difficulty urinating and dyspareunia.  Musculoskeletal: Negative for back pain, joint swelling, arthralgias and gait problem.  Neurological: Negative for dizziness, tremors, seizures, syncope, facial asymmetry, speech difficulty, weakness, light-headedness, numbness and headaches.  Hematological:  Negative for adenopathy. Does not bruise/bleed easily.  Psychiatric/Behavioral: Negative for hallucinations, behavioral problems, confusion, dysphoric mood, decreased concentration and agitation.    Objective:   Filed Vitals:   05/02/13 1223  BP: 106/68  Pulse: 66  Temp: 98.3 F (36.8 C)  Resp: 16    Physical Exam  Constitutional: Appears well-developed and well-nourished. No distress.  HENT: Normocephalic. External right and left ear normal. Oropharynx is clear  and moist.  Eyes: Conjunctivae and EOM are normal. PERRLA, no scleral icterus.  Neck: Normal ROM. Neck supple. No JVD. No tracheal deviation. No thyromegaly.  CVS: RRR, S1/S2 +, no murmurs, no gallops, no carotid bruit.  Pulmonary: Effort and breath sounds normal, no stridor, rhonchi, wheezes, rales.  Abdominal: Soft. BS +,  no distension, tenderness, rebound or guarding.  Musculoskeletal: Normal range of motion. No edema and no tenderness.  Lymphadenopathy: No lymphadenopathy noted, cervical, inguinal. Neuro: Alert. Normal reflexes, muscle tone coordination. No cranial nerve deficit. Skin: Skin is warm and dry. No rash noted. Not diaphoretic. No erythema. No pallor.  Psychiatric: Normal mood and affect. Behavior, judgment, thought content normal.   Lab Results  Component Value Date   WBC 4.0 04/13/2013   HGB 12.9* 04/13/2013   HCT 37.1* 04/13/2013   MCV 87.5 04/13/2013   PLT 224 04/13/2013   Lab Results  Component Value Date   CREATININE 1.50* 04/13/2013   BUN 12 04/13/2013   NA 141 04/13/2013   K 4.0 04/13/2013   CL 106 04/13/2013   CO2 28 04/13/2013    No results found for this basename: HGBA1C   Lipid Panel  No results found for this basename: chol, trig, hdl, cholhdl, vldl, ldlcalc       Assessment and plan:   Patient Active Problem List   Diagnosis Date Noted  . Peptic ulcer disease - discuss discontinuation of naproxen as it may make it difficult her disease a lot worse. Will place patient on protonic to take 40 mg tablet once daily along with probiotic Saccharomyces Bullard B. Patient also advised to follow recommended diet of void fluid with lots of acid content in it.  04/13/2013  . GERD (gastroesophageal reflux disease) - continue protonic says mentioned above, please note the pathology results were negative for H. pylori or gluten disease  04/13/2013

## 2013-05-02 NOTE — Patient Instructions (Signed)
Peptic Ulcer A peptic ulcer is a sore in the lining of in your esophagus (esophageal ulcer), stomach (gastric ulcer), or in the first part of your small intestine (duodenal ulcer). The ulcer causes erosion into the deeper tissue. CAUSES  Normally, the lining of the stomach and the small intestine protects itself from the acid that digests food. The protective lining can be damaged by:  An infection caused by a bacterium called Helicobacter pylori (H. pylori).  Regular use of nonsteroidal anti-inflammatory drugs (NSAIDs), such as ibuprofen or aspirin.  Smoking tobacco. Other risk factors include being older than 50, drinking alcohol excessively, and having a family history of ulcer disease.  SYMPTOMS   Burning pain or gnawing in the area between the chest and the belly button.  Heartburn.  Nausea and vomiting.  Bloating. The pain can be worse on an empty stomach and at night. If the ulcer results in bleeding, it can cause:  Black, tarry stools.  Vomiting of bright red blood.  Vomiting of coffee ground looking materials. DIAGNOSIS  A diagnosis is usually made based upon your history and an exam. Other tests and procedures may be performed to find the cause of the ulcer. Finding a cause will help determine the best treatment. Tests and procedures may include:  Blood tests, stool tests, or breath tests to check for the bacterium H. pylori.  An upper gastrointestinal (GI) series of the esophagus, stomach, and small intestine.  An endoscopy to examine the esophagus, stomach, and small intestine.  A biopsy. TREATMENT  Treatment may include:  Eliminating the cause of the ulcer, such as smoking, NSAIDs, or alcohol.  Medicines to reduce the amount of acid in your digestive tract.  Antibiotic medicines if the ulcer is caused by the H. pylori bacterium.  An upper endoscopy to treat a bleeding ulcer.  Surgery if the bleeding is severe or if the ulcer created a hole somewhere in the  digestive system. HOME CARE INSTRUCTIONS   Avoid tobacco, alcohol, and caffeine. Smoking can increase the acid in the stomach, and continued smoking will impair the healing of ulcers.  Avoid foods and drinks that seem to cause discomfort or aggravate your ulcer.  Only take medicines as directed by your caregiver. Do not substitute over-the-counter medicines for prescription medicines without talking to your caregiver.  Keep any follow-up appointments and tests as directed. SEEK MEDICAL CARE IF:   Your do not improve within 7 days of starting treatment.  You have ongoing indigestion or heartburn. SEEK IMMEDIATE MEDICAL CARE IF:   You have sudden, sharp, or persistent abdominal pain.  You have bloody or dark black, tarry stools.  You vomit blood or vomit that looks like coffee grounds.  You become light headed, weak, or feel faint.  You become sweaty or clammy. MAKE SURE YOU:   Understand these instructions.  Will watch your condition.  Will get help right away if you are not doing well or get worse. Document Released: 10/15/2000 Document Revised: 07/12/2012 Document Reviewed: 05/17/2012 [employer] Patient Information 2014 [employer].  

## 2013-05-02 NOTE — Progress Notes (Signed)
Please excuse patient Mr Jeremy Soto for following dates: 07/01 - 05/05/2013 due to medical reasons. Please call with questions.  Debbora Presto, MD  [facility] Pager [phone] Office [phone]

## 2013-05-02 NOTE — Progress Notes (Signed)
Pt here for f/u post abdominal pain x 3 weeks ago. S/p endoscopy @ [facility] 6/23 diag peptic ulcer. Pt to f/u upper gi series and await biopsy results. C/o intermit abdominal burning pain more in am. Denies vomiting but has nausea. States has not been able to eat nothing but chips due to pain. Pt was prescribed Prilosec but unable to afford at this time.

## 2013-07-31 ENCOUNTER — Telehealth: Payer: Self-pay | Admitting: *Deleted

## 2013-07-31 NOTE — Telephone Encounter (Signed)
Message copied by Florene Glen on Tue Jul 31, 2013  8:52 AM ------      Message from: Florene Glen      Created: Mon Apr 30, 2013  2:47 PM       Breath test in 3 months after ab ------

## 2013-08-01 NOTE — Telephone Encounter (Signed)
Mailed pt a letter requesting he call to schedule the breath Test; also mailed our old prep instructions.

## 2013-09-04 ENCOUNTER — Ambulatory Visit: Payer: BC Managed Care – PPO | Admitting: Family Medicine

## 2014-04-30 ENCOUNTER — Encounter (HOSPITAL_COMMUNITY): Payer: Self-pay | Admitting: Emergency Medicine

## 2014-04-30 ENCOUNTER — Emergency Department (HOSPITAL_COMMUNITY)
Admission: EM | Admit: 2014-04-30 | Discharge: 2014-05-01 | Disposition: A | Discharge status: Home / Self Care | Payer: Self-pay | Attending: Emergency Medicine | Admitting: Emergency Medicine

## 2014-04-30 DIAGNOSIS — S1093XA Contusion of unspecified part of neck, initial encounter: Secondary | ICD-10-CM

## 2014-04-30 DIAGNOSIS — Z87828 Personal history of other (healed) physical injury and trauma: Secondary | ICD-10-CM | POA: Insufficient documentation

## 2014-04-30 DIAGNOSIS — S2231XA Fracture of one rib, right side, initial encounter for closed fracture: Secondary | ICD-10-CM

## 2014-04-30 DIAGNOSIS — Z8719 Personal history of other diseases of the digestive system: Secondary | ICD-10-CM | POA: Insufficient documentation

## 2014-04-30 DIAGNOSIS — S2239XA Fracture of one rib, unspecified side, initial encounter for closed fracture: Secondary | ICD-10-CM | POA: Insufficient documentation

## 2014-04-30 DIAGNOSIS — Z792 Long term (current) use of antibiotics: Secondary | ICD-10-CM | POA: Insufficient documentation

## 2014-04-30 DIAGNOSIS — Z23 Encounter for immunization: Secondary | ICD-10-CM | POA: Insufficient documentation

## 2014-04-30 DIAGNOSIS — F17200 Nicotine dependence, unspecified, uncomplicated: Secondary | ICD-10-CM | POA: Insufficient documentation

## 2014-04-30 DIAGNOSIS — IMO0002 Reserved for concepts with insufficient information to code with codable children: Secondary | ICD-10-CM | POA: Insufficient documentation

## 2014-04-30 DIAGNOSIS — S0003XA Contusion of scalp, initial encounter: Secondary | ICD-10-CM | POA: Insufficient documentation

## 2014-04-30 DIAGNOSIS — S0083XA Contusion of other part of head, initial encounter: Secondary | ICD-10-CM | POA: Insufficient documentation

## 2014-04-30 DIAGNOSIS — S022XXA Fracture of nasal bones, initial encounter for closed fracture: Secondary | ICD-10-CM | POA: Insufficient documentation

## 2014-04-30 MED ORDER — MORPHINE SULFATE 4 MG/ML IJ SOLN
4.0000 mg | Freq: Once | INTRAMUSCULAR | Status: AC
  Administered 2014-04-30: 4 mg via INTRAVENOUS
  Filled 2014-04-30: qty 1

## 2014-04-30 MED ORDER — ONDANSETRON HCL 4 MG/2ML IJ SOLN
4.0000 mg | Freq: Once | INTRAMUSCULAR | Status: AC
  Administered 2014-04-30: 4 mg via INTRAVENOUS
  Filled 2014-04-30: qty 2
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
## 2014-04-30 NOTE — ED Notes (Signed)
The tech has cleaned the abrasion on the nose, and cleaned the wounds to posterior of head and applied a dressing. The tech has reported to the RN in charge.

## 2014-05-01 ENCOUNTER — Emergency Department (HOSPITAL_COMMUNITY): Payer: Self-pay

## 2014-05-01 ENCOUNTER — Emergency Department (HOSPITAL_COMMUNITY): Payer: BC Managed Care – PPO

## 2014-05-01 LAB — CBC WITH DIFFERENTIAL/PLATELET
Basophils Absolute: 0 10*3/uL (ref 0.0–0.1)
Basophils Relative: 0 % (ref 0–1)
EOS PCT: 0 % (ref 0–5)
Eosinophils Absolute: 0 10*3/uL (ref 0.0–0.7)
HCT: 31.8 % — ABNORMAL LOW (ref 39.0–52.0)
Hemoglobin: 11.1 g/dL — ABNORMAL LOW (ref 13.0–17.0)
LYMPHS ABS: 1.7 10*3/uL (ref 0.7–4.0)
LYMPHS PCT: 25 % (ref 12–46)
MCH: 30.9 pg (ref 26.0–34.0)
MCHC: 34.9 g/dL (ref 30.0–36.0)
MCV: 88.6 fL (ref 78.0–100.0)
MONOS PCT: 6 % (ref 3–12)
Monocytes Absolute: 0.4 10*3/uL (ref 0.1–1.0)
Neutro Abs: 4.7 10*3/uL (ref 1.7–7.7)
Neutrophils Relative %: 69 % (ref 43–77)
PLATELETS: 202 10*3/uL (ref 150–400)
RBC: 3.59 MIL/uL — AB (ref 4.22–5.81)
RDW: 12.4 % (ref 11.5–15.5)
WBC: 6.9 10*3/uL (ref 4.0–10.5)

## 2014-05-01 LAB — URINALYSIS, ROUTINE W REFLEX MICROSCOPIC
BILIRUBIN URINE: NEGATIVE
GLUCOSE, UA: NEGATIVE mg/dL
Hgb urine dipstick: NEGATIVE
Ketones, ur: NEGATIVE mg/dL
Leukocytes, UA: NEGATIVE
Nitrite: NEGATIVE
Protein, ur: NEGATIVE mg/dL
Specific Gravity, Urine: 1.014 (ref 1.005–1.030)
UROBILINOGEN UA: 0.2 mg/dL (ref 0.0–1.0)
pH: 5 (ref 5.0–8.0)

## 2014-05-01 LAB — BASIC METABOLIC PANEL
BUN: 13 mg/dL (ref 6–23)
CO2: 20 meq/L (ref 19–32)
Calcium: 9.3 mg/dL (ref 8.4–10.5)
Chloride: 101 mEq/L (ref 96–112)
Creatinine, Ser: 1.4 mg/dL — ABNORMAL HIGH (ref 0.50–1.35)
GFR calc Af Amer: 68 mL/min — ABNORMAL LOW (ref >90)
GFR calc non Af Amer: 58 mL/min — ABNORMAL LOW (ref >90)
GLUCOSE: 103 mg/dL — AB (ref 70–99)
POTASSIUM: 3.3 meq/L — AB (ref 3.7–5.3)
SODIUM: 140 meq/L (ref 137–147)

## 2014-05-01 MED ORDER — IBUPROFEN 600 MG PO TABS: 600.0000 mg | ORAL_TABLET | Freq: Four times a day (QID) | ORAL | Status: DC | PRN

## 2014-05-01 MED ORDER — HYDROMORPHONE HCL PF 1 MG/ML IJ SOLN
1.0000 mg | Freq: Once | INTRAMUSCULAR | Status: AC
  Administered 2014-05-01: 1 mg via INTRAVENOUS
  Filled 2014-05-01: qty 1

## 2014-05-01 MED ORDER — IOHEXOL 300 MG/ML  SOLN
100.0000 mL | Freq: Once | INTRAMUSCULAR | Status: AC | PRN
  Administered 2014-05-01: 100 mL via INTRAVENOUS

## 2014-05-01 MED ORDER — OXYCODONE-ACETAMINOPHEN 5-325 MG PO TABS: 1.0000 | ORAL_TABLET | Freq: Four times a day (QID) | ORAL | Status: DC | PRN

## 2014-05-01 MED ORDER — TETANUS-DIPHTH-ACELL PERTUSSIS 5-2.5-18.5 LF-MCG/0.5 IM SUSP
0.5000 mL | Freq: Once | INTRAMUSCULAR | Status: AC
  Administered 2014-05-01: 0.5 mL via INTRAMUSCULAR
  Filled 2014-05-01: qty 0.5

## 2014-05-01 MED ORDER — OXYCODONE-ACETAMINOPHEN 5-325 MG PO TABS
1.0000 | ORAL_TABLET | Freq: Once | ORAL | Status: AC
  Administered 2014-05-01: 1 via ORAL
  Filled 2014-05-01: qty 1

## 2014-05-01 MED ORDER — CEPHALEXIN 500 MG PO CAPS
500.0000 mg | ORAL_CAPSULE | Freq: Four times a day (QID) | ORAL | Status: DC
Start: 2014-05-01 — End: 2014-05-30

## 2014-05-01 MED ORDER — KETOROLAC TROMETHAMINE 30 MG/ML IJ SOLN
30.0000 mg | Freq: Once | INTRAMUSCULAR | Status: AC
  Administered 2014-05-01: 30 mg via INTRAVENOUS
  Filled 2014-05-01: qty 1

## 2014-05-01 NOTE — ED Notes (Signed)
Patient transported to CT 

## 2014-05-01 NOTE — Discharge Instructions (Signed)
Rib Fracture °A rib fracture is a break or crack in one of the bones of the ribs. The ribs are a group of long, curved bones that wrap around your chest and attach to your spine. They protect your lungs and other organs in the chest cavity. A broken or cracked rib is often painful, but most do not cause other problems. Most rib fractures heal on their own over time. However, rib fractures can be more serious if multiple ribs are broken or if broken ribs move out of place and push against other structures. °CAUSES  °· A direct blow to the chest. For example, this could happen during contact sports, a car accident, or a fall against a hard object. °· Repetitive movements with high force, such as pitching a baseball or having severe coughing spells. °SYMPTOMS  °· Pain when you breathe in or cough. °· Pain when someone presses on the injured area. °DIAGNOSIS  °Your caregiver will perform a physical exam. Various imaging tests may be ordered to confirm the diagnosis and to look for related injuries. These tests may include a chest X-ray, computed tomography (CT), magnetic resonance imaging (MRI), or a bone scan. °TREATMENT  °Rib fractures usually heal on their own in 1-3 months. The longer healing period is often associated with a continued cough or other aggravating activities. During the healing period, pain control is very important. Medication is usually given to control pain. Hospitalization or surgery may be needed for more severe injuries, such as those in which multiple ribs are broken or the ribs have moved out of place.  °HOME CARE INSTRUCTIONS  °· Avoid strenuous activity and any activities or movements that cause pain. Be careful during activities and avoid bumping the injured rib. °· Gradually increase activity as directed by your caregiver. °· Only take over-the-counter or prescription medications as directed by your caregiver. Do not take other medications without asking your caregiver first. °· Apply ice  to the injured area for the first 1-2 days after you have been treated or as directed by your caregiver. Applying ice helps to reduce inflammation and pain. °¨ Put ice in a plastic bag. °¨ Place a towel between your skin and the bag.   °¨ Leave the ice on for 15-20 minutes at a time, every 2 hours while you are awake. °· Perform deep breathing as directed by your caregiver. This will help prevent pneumonia, which is a common complication of a broken rib. Your caregiver may instruct you to: °¨ Take deep breaths several times a day. °¨ Try to cough several times a day, holding a pillow against the injured area. °¨ Use a device called an incentive spirometer to practice deep breathing several times a day. °· Drink enough fluids to keep your urine clear or pale yellow. This will help you avoid constipation.   °· Do not wear a rib belt or binder. These restrict breathing, which can lead to pneumonia.   °SEEK IMMEDIATE MEDICAL CARE IF:  °· You have a fever.   °· You have difficulty breathing or shortness of breath.   °· You develop a continual cough, or you cough up thick or bloody sputum. °· You feel sick to your stomach (nausea), throw up (vomit), or have abdominal pain.   °· You have worsening pain not controlled with medications.   °MAKE SURE YOU: °· Understand these instructions. °· Will watch your condition. °· Will get help right away if you are not doing well or get worse. °Document Released: 10/18/2005 Document Revised: 06/20/2013 Document Reviewed:   12/20/2012 [employer] Patient Information 2015 [place], [place]. This information is not intended to replace advice given to you by your health care provider. Make sure you discuss any questions you have with your health care provider. Nasal Fracture A nasal fracture is a break or crack in the bones of the nose. A minor break usually heals in a month. You often will receive black eyes from a nasal fracture. This is not a cause for concern. The black eyes will go away  over 1 to 2 weeks.  DIAGNOSIS  Your caregiver may want to examine you if you are concerned about a fracture of the nose. X-rays of the nose may not show a nasal fracture even when one is present. Sometimes your caregiver must wait 1 to 5 days after the injury to re-check the nose for alignment and to take additional X-rays. Sometimes the caregiver must wait until the swelling has gone down. TREATMENT Minor fractures that have caused no deformity often do not require treatment. More serious fractures where bones are displaced may require surgery. This will take place after the swelling is gone. Surgery will stabilize and align the fracture. HOME CARE INSTRUCTIONS   Put ice on the injured area.  Put ice in a plastic bag.  Place a towel between your skin and the bag.  Leave the ice on for 15-20 minutes, 03-04 times a day.  Take medications as directed by your caregiver.  Only take over-the-counter or prescription medicines for pain, discomfort, or fever as directed by your caregiver.  If your nose starts bleeding, squeeze the soft parts of the nose against the center wall while you are sitting in an upright position for 10 minutes.  Contact sports should be avoided for at least 3 to 4 weeks or as directed by your caregiver. SEEK MEDICAL CARE IF:  Your pain increases or becomes severe.  You continue to have nosebleeds.  The shape of your nose does not return to normal within 5 days.  You have pus draining from the nose. SEEK IMMEDIATE MEDICAL CARE IF:   You have bleeding from your nose that does not stop after 20 minutes of pinching the nostrils closed and keeping ice on the nose.  You have clear fluid draining from your nose.  You notice a grape-like swelling on the dividing wall between the nostrils (septum). This is a collection of blood (hematoma) that must be drained to help prevent infection.  You have difficulty moving your eyes.  You have recurrent vomiting. Document  Released: 10/15/2000 Document Revised: 01/10/2012 Document Reviewed: 02/01/2011 [facility] Patient Information 2015 [place], [place]. This information is not intended to replace advice given to you by your health care provider. Make sure you discuss any questions you have with your health care provider.

## 2014-05-01 NOTE — ED Notes (Signed)
Pt ambulated independently with no distress. Pt tolerated PO intake.

## 2014-05-16 ENCOUNTER — Ambulatory Visit (INDEPENDENT_AMBULATORY_CARE_PROVIDER_SITE_OTHER): Payer: BC Managed Care – PPO | Admitting: Otolaryngology

## 2014-05-23 ENCOUNTER — Ambulatory Visit (INDEPENDENT_AMBULATORY_CARE_PROVIDER_SITE_OTHER): Payer: Self-pay | Admitting: Otolaryngology

## 2014-05-23 DIAGNOSIS — S022XXA Fracture of nasal bones, initial encounter for closed fracture: Secondary | ICD-10-CM

## 2014-05-29 ENCOUNTER — Other Ambulatory Visit: Payer: Self-pay | Admitting: Otolaryngology

## 2014-05-30 ENCOUNTER — Encounter (HOSPITAL_BASED_OUTPATIENT_CLINIC_OR_DEPARTMENT_OTHER): Payer: Self-pay | Admitting: *Deleted

## 2014-05-30 NOTE — Progress Notes (Signed)
No labs needed

## 2014-06-04 ENCOUNTER — Encounter (HOSPITAL_BASED_OUTPATIENT_CLINIC_OR_DEPARTMENT_OTHER)
Admission: RE | Disposition: A | Discharge status: Home / Self Care | Payer: Self-pay | Source: Ambulatory Visit | Attending: Otolaryngology

## 2014-06-04 ENCOUNTER — Ambulatory Visit (HOSPITAL_BASED_OUTPATIENT_CLINIC_OR_DEPARTMENT_OTHER): Payer: Self-pay | Admitting: Anesthesiology

## 2014-06-04 ENCOUNTER — Encounter (HOSPITAL_BASED_OUTPATIENT_CLINIC_OR_DEPARTMENT_OTHER): Payer: Self-pay | Admitting: Anesthesiology

## 2014-06-04 ENCOUNTER — Ambulatory Visit (HOSPITAL_BASED_OUTPATIENT_CLINIC_OR_DEPARTMENT_OTHER)
Admission: RE | Admit: 2014-06-04 | Discharge: 2014-06-04 | Disposition: A | Discharge status: Home / Self Care | Payer: Self-pay | Source: Ambulatory Visit | Attending: Otolaryngology | Admitting: Otolaryngology

## 2014-06-04 ENCOUNTER — Encounter (HOSPITAL_BASED_OUTPATIENT_CLINIC_OR_DEPARTMENT_OTHER): Payer: Self-pay | Admitting: *Deleted

## 2014-06-04 DIAGNOSIS — S022XXB Fracture of nasal bones, initial encounter for open fracture: Secondary | ICD-10-CM

## 2014-06-04 DIAGNOSIS — F17200 Nicotine dependence, unspecified, uncomplicated: Secondary | ICD-10-CM | POA: Insufficient documentation

## 2014-06-04 DIAGNOSIS — S022XXA Fracture of nasal bones, initial encounter for closed fracture: Secondary | ICD-10-CM | POA: Insufficient documentation

## 2014-06-04 DIAGNOSIS — K219 Gastro-esophageal reflux disease without esophagitis: Secondary | ICD-10-CM | POA: Insufficient documentation

## 2014-06-04 HISTORY — PX: CLOSED REDUCTION NASAL FRACTURE: SHX5365

## 2014-06-04 LAB — POCT HEMOGLOBIN-HEMACUE: HEMOGLOBIN: 13.6 g/dL (ref 13.0–17.0)

## 2014-06-04 SURGERY — CLOSED REDUCTION, FRACTURE, NASAL BONE
Anesthesia: General | Site: Nose

## 2014-06-04 MED ORDER — LACTATED RINGERS IV SOLN
INTRAVENOUS | Status: DC
  Administered 2014-06-04 (×2): via INTRAVENOUS

## 2014-06-04 MED ORDER — FENTANYL CITRATE 0.05 MG/ML IJ SOLN: 50.0000 ug | INTRAMUSCULAR | Status: DC | PRN

## 2014-06-04 MED ORDER — FENTANYL CITRATE 0.05 MG/ML IJ SOLN
INTRAMUSCULAR | Status: AC
  Filled 2014-06-04: qty 6

## 2014-06-04 MED ORDER — OXYCODONE HCL 5 MG PO TABS
5.0000 mg | ORAL_TABLET | Freq: Once | ORAL | Status: AC | PRN
  Administered 2014-06-04: 5 mg via ORAL

## 2014-06-04 MED ORDER — DEXAMETHASONE SODIUM PHOSPHATE 4 MG/ML IJ SOLN
INTRAMUSCULAR | Status: DC | PRN
  Administered 2014-06-04: 10 mg via INTRAVENOUS

## 2014-06-04 MED ORDER — MIDAZOLAM HCL 2 MG/2ML IJ SOLN
INTRAMUSCULAR | Status: AC
  Filled 2014-06-04: qty 2

## 2014-06-04 MED ORDER — MIDAZOLAM HCL 5 MG/5ML IJ SOLN
INTRAMUSCULAR | Status: DC | PRN
  Administered 2014-06-04: 2 mg via INTRAVENOUS

## 2014-06-04 MED ORDER — OXYCODONE HCL 5 MG/5ML PO SOLN: 5.0000 mg | Freq: Once | ORAL | Status: AC | PRN

## 2014-06-04 MED ORDER — HYDROMORPHONE HCL PF 1 MG/ML IJ SOLN
INTRAMUSCULAR | Status: AC
  Filled 2014-06-04: qty 1

## 2014-06-04 MED ORDER — FENTANYL CITRATE 0.05 MG/ML IJ SOLN
INTRAMUSCULAR | Status: DC | PRN
  Administered 2014-06-04: 100 ug via INTRAVENOUS

## 2014-06-04 MED ORDER — OXYCODONE HCL 5 MG PO TABS
ORAL_TABLET | ORAL | Status: AC
  Filled 2014-06-04: qty 1

## 2014-06-04 MED ORDER — LIDOCAINE HCL (CARDIAC) 20 MG/ML IV SOLN
INTRAVENOUS | Status: DC | PRN
  Administered 2014-06-04: 50 mg via INTRAVENOUS

## 2014-06-04 MED ORDER — OXYMETAZOLINE HCL 0.05 % NA SOLN
NASAL | Status: DC | PRN
  Administered 2014-06-04: 1 via NASAL

## 2014-06-04 MED ORDER — BACITRACIN ZINC 500 UNIT/GM EX OINT
TOPICAL_OINTMENT | CUTANEOUS | Status: AC
  Filled 2014-06-04: qty 28.35

## 2014-06-04 MED ORDER — MIDAZOLAM HCL 2 MG/2ML IJ SOLN: 1.0000 mg | INTRAMUSCULAR | Status: DC | PRN

## 2014-06-04 MED ORDER — HYDROMORPHONE HCL PF 1 MG/ML IJ SOLN
0.2500 mg | INTRAMUSCULAR | Status: DC | PRN
  Administered 2014-06-04 (×2): 0.5 mg via INTRAVENOUS

## 2014-06-04 MED ORDER — LIDOCAINE-EPINEPHRINE 1 %-1:100000 IJ SOLN
INTRAMUSCULAR | Status: AC
  Filled 2014-06-04: qty 1

## 2014-06-04 MED ORDER — PROPOFOL 10 MG/ML IV BOLUS
INTRAVENOUS | Status: DC | PRN
  Administered 2014-06-04: 200 mg via INTRAVENOUS

## 2014-06-04 MED ORDER — HYDROCODONE-ACETAMINOPHEN 5-325 MG PO TABS: 1.0000 | ORAL_TABLET | Freq: Four times a day (QID) | ORAL | Status: DC | PRN

## 2014-06-04 MED ORDER — ONDANSETRON HCL 4 MG/2ML IJ SOLN
INTRAMUSCULAR | Status: DC | PRN
  Administered 2014-06-04: 4 mg via INTRAVENOUS

## 2014-06-04 SURGICAL SUPPLY — 36 items
BENZOIN TINCTURE PRP APPL 2/3 (GAUZE/BANDAGES/DRESSINGS) IMPLANT
CANISTER SUCT 1200ML W/VALVE (MISCELLANEOUS) ×3 IMPLANT
CONT SPEC 4OZ CLIKSEAL STRL BL (MISCELLANEOUS) IMPLANT
COVER MAYO STAND STRL (DRAPES) ×3 IMPLANT
DECANTER SPIKE VIAL GLASS SM (MISCELLANEOUS) IMPLANT
DEPRESSOR TONGUE BLADE STERILE (MISCELLANEOUS) IMPLANT
DRESSING ADAPTIC 1/2  N-ADH (PACKING) IMPLANT
DRESSING NASAL KENNEDY 3.5X.9 (MISCELLANEOUS) IMPLANT
DRSG NASAL KENNEDY 3.5X.9 (MISCELLANEOUS)
DRSG TELFA 3X8 NADH (GAUZE/BANDAGES/DRESSINGS) IMPLANT
GLOVE BIO SURGEON STRL SZ7.5 (GLOVE) IMPLANT
GLOVE BIOGEL PI IND STRL 7.0 (GLOVE) ×1 IMPLANT
GLOVE BIOGEL PI INDICATOR 7.0 (GLOVE) ×2
GLOVE ECLIPSE 6.5 STRL STRAW (GLOVE) ×3 IMPLANT
KIT SPLINT NASAL DENVER LRG BE (GAUZE/BANDAGES/DRESSINGS) IMPLANT
KIT SPLINT NASAL DENVER MIN BE (GAUZE/BANDAGES/DRESSINGS) IMPLANT
KIT SPLINT NASAL DENVER PET BE (GAUZE/BANDAGES/DRESSINGS) IMPLANT
KIT SPLINT NASAL DENVER SM BEI (GAUZE/BANDAGES/DRESSINGS) ×3 IMPLANT
MARKER SKIN DUAL TIP RULER LAB (MISCELLANEOUS) IMPLANT
NEEDLE 27GAX1X1/2 (NEEDLE) ×3 IMPLANT
NS IRRIG 1000ML POUR BTL (IV SOLUTION) IMPLANT
PACK BASIN DAY SURGERY FS (CUSTOM PROCEDURE TRAY) ×3 IMPLANT
SHEET MEDIUM DRAPE 40X70 STRL (DRAPES) ×3 IMPLANT
SPONGE GAUZE 2X2 8PLY STER LF (GAUZE/BANDAGES/DRESSINGS) ×1
SPONGE GAUZE 2X2 8PLY STRL LF (GAUZE/BANDAGES/DRESSINGS) ×2 IMPLANT
SPONGE GAUZE 4X4 12PLY STER LF (GAUZE/BANDAGES/DRESSINGS) IMPLANT
SPONGE NEURO XRAY DETECT 1X3 (DISPOSABLE) ×3 IMPLANT
STRIP SUTURE WOUND CLOSURE 1/2 (SUTURE) IMPLANT
SUT ETHILON 3 0 PS 1 (SUTURE) IMPLANT
SYR CONTROL 10ML LL (SYRINGE) ×3 IMPLANT
TAPE PAPER 1/2X10 TAN MEDIPORE (MISCELLANEOUS) ×3 IMPLANT
TOWEL OR 17X24 6PK STRL BLUE (TOWEL DISPOSABLE) ×3 IMPLANT
TUBE CONNECTING 20'X1/4 (TUBING) ×1
TUBE CONNECTING 20X1/4 (TUBING) ×2 IMPLANT
TUBE SALEM SUMP 12R W/ARV (TUBING) IMPLANT
YANKAUER SUCT BULB TIP NO VENT (SUCTIONS) IMPLANT

## 2014-06-04 NOTE — Anesthesia Preprocedure Evaluation (Addendum)
Anesthesia Evaluation  Patient identified by MRN, date of birth, ID band Patient awake    Reviewed: Allergy & Precautions, H&P , NPO status , Patient's Chart, lab work & pertinent test results  Airway Mallampati: II TM Distance: >3 FB Neck ROM: Full    Dental no notable dental hx. (+) Teeth Intact, Dental Advisory Given   Pulmonary Current Smoker,  breath sounds clear to auscultation  Pulmonary exam normal       Cardiovascular negative cardio ROS  Rhythm:Regular Rate:Normal     Neuro/Psych negative neurological ROS  negative psych ROS   GI/Hepatic Neg liver ROS, GERD-  Controlled,  Endo/Other  negative endocrine ROS  Renal/GU negative Renal ROS  negative genitourinary   Musculoskeletal   Abdominal   Peds  Hematology negative hematology ROS (+)   Anesthesia Other Findings   Reproductive/Obstetrics negative OB ROS                          Anesthesia Physical Anesthesia Plan  ASA: II  Anesthesia Plan: General   Post-op Pain Management:    Induction: Intravenous  Airway Management Planned: LMA  Additional Equipment:   Intra-op Plan:   Post-operative Plan: Extubation in OR  Informed Consent: I have reviewed the patients History and Physical, chart, labs and discussed the procedure including the risks, benefits and alternatives for the proposed anesthesia with the patient or authorized representative who has indicated his/her understanding and acceptance.   Dental advisory given  Plan Discussed with: CRNA  Anesthesia Plan Comments:        Anesthesia Quick Evaluation

## 2014-06-04 NOTE — Discharge Instructions (Addendum)
See the printed discharge instructions.    Post Anesthesia Home Care Instructions  Activity: Get plenty of rest for the remainder of the day. A responsible adult should stay with you for 24 hours following the procedure.  For the next 24 hours, DO NOT: -Drive a car -Paediatric nurse -Drink alcoholic beverages -Take any medication unless instructed by your physician -Make any legal decisions or sign important papers.  Meals: Start with liquid foods such as gelatin or soup. Progress to regular foods as tolerated. Avoid greasy, spicy, heavy foods. If nausea and/or vomiting occur, drink only clear liquids until the nausea and/or vomiting subsides. Call your physician if vomiting continues.  Special Instructions/Symptoms: Your throat may feel dry or sore from the anesthesia or the breathing tube placed in your throat during surgery. If this causes discomfort, gargle with warm salt water. The discomfort should disappear within 24 hours.

## 2014-06-04 NOTE — Brief Op Note (Signed)
06/04/2014  12:58 PM  PATIENT:  Jeremy Soto  48 y.o. male  PRE-OPERATIVE DIAGNOSIS:  NASAL FRACTURE   POST-OPERATIVE DIAGNOSIS:  nasal fracture  PROCEDURE:  Procedure(s): CLOSED REDUCTION NASAL FRACTURE WITH STABILIZATION  SURGEON:  Surgeon(s) and Role:    * Sui W Dhanvin Szeto, MD - Primary  PHYSICIAN ASSISTANT:   ASSISTANTS: none   ANESTHESIA:   general  EBL:  Total I/O In: 1000 [I.V.:1000] Out: -   BLOOD ADMINISTERED:none  DRAINS: none   LOCAL MEDICATIONS USED:  NONE  SPECIMEN:  No Specimen  DISPOSITION OF SPECIMEN:  N/A  COUNTS:  YES  TOURNIQUET:  * No tourniquets in log *  DICTATION: .Other Dictation: Dictation Number    PLAN OF CARE: Discharge to home after PACU  PATIENT DISPOSITION:  PACU - hemodynamically stable.   Delay start of Pharmacological VTE agent (>24hrs) due to surgical blood loss or risk of bleeding: not applicable

## 2014-06-04 NOTE — Transfer of Care (Signed)
Immediate Anesthesia Transfer of Care Note  Patient: Jeremy Soto  Procedure(s) Performed: Procedure(s): CLOSED REDUCTION NASAL FRACTURE (N/A)  Patient Location: PACU  Anesthesia Type:General  Level of Consciousness: awake and alert   Airway & Oxygen Therapy: Patient Spontanous Breathing and Patient connected to face mask oxygen  Post-op Assessment: Report given to PACU RN and Post -op Vital signs reviewed and stable  Post vital signs: Reviewed and stable  Complications: No apparent anesthesia complications

## 2014-06-04 NOTE — Anesthesia Postprocedure Evaluation (Signed)
  Anesthesia Post-op Note  Patient: Jeremy Soto  Procedure(s) Performed: Procedure(s): CLOSED REDUCTION NASAL FRACTURE (N/A)  Patient Location: PACU  Anesthesia Type:General  Level of Consciousness: awake and alert   Airway and Oxygen Therapy: Patient Spontanous Breathing  Post-op Pain: mild  Post-op Assessment: Post-op Vital signs reviewed, Patient's Cardiovascular Status Stable and Respiratory Function Stable  Post-op Vital Signs: Reviewed  Filed Vitals:   06/04/14 1345  BP: 154/94  Pulse: 62  Temp:   Resp: 14    Complications: No apparent anesthesia complications

## 2014-06-04 NOTE — Anesthesia Procedure Notes (Signed)
Procedure Name: LMA Insertion Date/Time: 06/04/2014 12:45 PM Performed by: Lieutenant Diego Pre-anesthesia Checklist: Patient identified, Emergency Drugs available, Suction available and Patient being monitored Patient Re-evaluated:Patient Re-evaluated prior to inductionOxygen Delivery Method: Circle System Utilized Preoxygenation: Pre-oxygenation with 100% oxygen Intubation Type: IV induction Ventilation: Mask ventilation without difficulty LMA: LMA flexible inserted LMA Size: 5.0 Number of attempts: 1 Airway Equipment and Method: bite block Placement Confirmation: positive ETCO2 and breath sounds checked- equal and bilateral Tube secured with: Tape Dental Injury: Teeth and Oropharynx as per pre-operative assessment

## 2014-06-04 NOTE — H&P (Signed)
H&P Update  Pt's original H&P dated 05/23/14 reviewed and placed in chart (to be scanned).  I personally examined the patient today.  No change in health. Proceed with closed reduction of nasal/septal fracture with stabilization.

## 2014-06-05 ENCOUNTER — Encounter (HOSPITAL_BASED_OUTPATIENT_CLINIC_OR_DEPARTMENT_OTHER): Payer: Self-pay | Admitting: Otolaryngology
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
## 2015-01-16 ENCOUNTER — Ambulatory Visit: Payer: Self-pay | Attending: Internal Medicine

## 2015-03-10 ENCOUNTER — Encounter (HOSPITAL_COMMUNITY): Payer: Self-pay | Admitting: Emergency Medicine

## 2015-03-10 ENCOUNTER — Emergency Department (HOSPITAL_COMMUNITY)
Admission: EM | Admit: 2015-03-10 | Discharge: 2015-03-11 | Disposition: A | Discharge status: Home / Self Care | Payer: Self-pay | Attending: Emergency Medicine | Admitting: Emergency Medicine

## 2015-03-10 DIAGNOSIS — Z8719 Personal history of other diseases of the digestive system: Secondary | ICD-10-CM | POA: Insufficient documentation

## 2015-03-10 DIAGNOSIS — F1010 Alcohol abuse, uncomplicated: Secondary | ICD-10-CM | POA: Insufficient documentation

## 2015-03-10 DIAGNOSIS — Z79899 Other long term (current) drug therapy: Secondary | ICD-10-CM | POA: Insufficient documentation

## 2015-03-10 DIAGNOSIS — Z720 Tobacco use: Secondary | ICD-10-CM | POA: Insufficient documentation

## 2015-03-10 DIAGNOSIS — F1410 Cocaine abuse, uncomplicated: Secondary | ICD-10-CM | POA: Insufficient documentation

## 2015-03-10 DIAGNOSIS — F1910 Other psychoactive substance abuse, uncomplicated: Secondary | ICD-10-CM

## 2015-03-10 LAB — RAPID URINE DRUG SCREEN, HOSP PERFORMED
AMPHETAMINES: NOT DETECTED
Barbiturates: NOT DETECTED
Benzodiazepines: NOT DETECTED
Cocaine: POSITIVE — AB
Opiates: NOT DETECTED
Tetrahydrocannabinol: NOT DETECTED

## 2015-03-10 LAB — CBC
HEMATOCRIT: 34.9 % — AB (ref 39.0–52.0)
Hemoglobin: 11.9 g/dL — ABNORMAL LOW (ref 13.0–17.0)
MCH: 31 pg (ref 26.0–34.0)
MCHC: 34.1 g/dL (ref 30.0–36.0)
MCV: 90.9 fL (ref 78.0–100.0)
Platelets: 178 10*3/uL (ref 150–400)
RBC: 3.84 MIL/uL — AB (ref 4.22–5.81)
RDW: 12.9 % (ref 11.5–15.5)
WBC: 4.5 10*3/uL (ref 4.0–10.5)

## 2015-03-10 LAB — COMPREHENSIVE METABOLIC PANEL
ALK PHOS: 64 U/L (ref 38–126)
ALT: 12 U/L — ABNORMAL LOW (ref 17–63)
ANION GAP: 4 — AB (ref 5–15)
AST: 16 U/L (ref 15–41)
Albumin: 4 g/dL (ref 3.5–5.0)
BILIRUBIN TOTAL: 0.6 mg/dL (ref 0.3–1.2)
BUN: 18 mg/dL (ref 6–20)
CHLORIDE: 111 mmol/L (ref 101–111)
CO2: 26 mmol/L (ref 22–32)
Calcium: 9.4 mg/dL (ref 8.9–10.3)
Creatinine, Ser: 1.54 mg/dL — ABNORMAL HIGH (ref 0.61–1.24)
GFR calc Af Amer: 60 mL/min — ABNORMAL LOW (ref >60)
GFR, EST NON AFRICAN AMERICAN: 52 mL/min — AB (ref >60)
Glucose, Bld: 101 mg/dL — ABNORMAL HIGH (ref 70–99)
Potassium: 4.1 mmol/L (ref 3.5–5.1)
Sodium: 141 mmol/L (ref 135–145)
Total Protein: 7.4 g/dL (ref 6.5–8.1)

## 2015-03-10 LAB — SALICYLATE LEVEL: Salicylate Lvl: <4 mg/dL (ref 2.8–30.0)

## 2015-03-10 LAB — ETHANOL: Alcohol, Ethyl (B): <5 mg/dL (ref <5)

## 2015-03-10 LAB — ACETAMINOPHEN LEVEL

## 2015-03-10 NOTE — ED Notes (Signed)
Patient sent here by [facility] for detox from ETOH and cocaine. Pt states last ETOH was five 40-oz beers yesterday and last cocaine use was on Friday. Pt states he sometimes has SI and HI. Pt states that he may harm himself right now if he had the chance. Pt c/o occasional auditory hallucinations which tell him to hurt other people. Patient states he is supposed to be sent [facility] after med-clearance.

## 2015-03-10 NOTE — Discharge Instructions (Signed)
Substance Abuse Treatment Programs ° °Intensive Outpatient Programs °[facility]     °[address]      °[place], [place]                   °[phone]      ° °[facility] °[address] °[place], [place] °[phone] ° °[facility]     °(Inpatient and outpatient)     °[address]           °[phone]   ° °[facility] °[phone] (Suboxone and Methadone) ° °[address]      °[place], [place] [address]      °[phone]      ° °[address] °[place], [place] °[phone] ° °[facility] (Outpatient/Inpatient, Chemical)    °(insurance only) [phone]      °       °[facility] (Groups & Residential) °[place], [place] °[phone] ° °   °[facility]     °[address]     °[place], [place]      °[phone]      ° °[facility] (for caregivers and family) °[address] °[place], [place] °[phone] ° ° ° ° ° °Residential Treatment Programs °[facility]      °[address], [place], [place] [address]  °[phone]      ° °[facility] °[address] [place], [place] [address] °[phone] ° °[facility]        °[phone]      ° °[facility] °[phone] ° °[facility]             °[address]                                         °[place], [place]                                                °[phone] or [phone]                              ° °[facility] [place] °[address] °[place], [address] °[phone] ° °[facility]    °[address]      °, [place]     °[phone]      ° °[facility] °[address] °, [place] °[phone] ° °[facility]   °[address]     °[place], [place] [address]     °[phone]      °Admissions: 8am-3pm M-F ° °[facility] °[address] °[place],  [place] °[phone] ° °[facility]: Residential Program (90 Days)   °[place], [place]      °[phone] or [phone]    ° °[facility] °[place], [place] °(Walk in Hours over the weekend or by referral) ° °[facility] °[address], [place],  [address] °([phone] ° °Crisis Mobile: [facility]:  1-877-626-1772 (for crisis response 24 hours a day) °[facility] Hotline:      1-800-256-2452 °Outpatient Psychiatry and Counseling ° °[facility]: Mobile Crisis   Management 24 hours:  1-579-867-5796  [facility] of the [employer] sliding scale fee and walk in schedule: M-F 8am-12pm/1pm-3pm [address]  [place], [place] [address] [place], [place] [address] [phone]  [facility] ([facility])- new patient walk-in appointments available Monday - Friday 8am -3pm.          [address] [place], [place] [address] [phone] or crisis line- [facility]/ Intensive Outpatient Therapy Program [place], [place] [address] [place]      [phone] [place] [place], [place] [address]                 [place]   [facility] [phone] [place], [place] [address]   [facility]          [address] Johnette Abraham  [place], [place] [address]       [phone]  [facility] [address] [place], [place] [address] [phone]  [facility]    [address] [place], [place] [address]     [place], [place] Joycelyn Man     [place] [address]     [phone]       [facility] [place] [address]  [facility]     [address], [place], [place], [place] [address] [place]     [address]     [place], [place] [address]     [phone]       [facility] [address] [place], [place] [address] [phone] Resources for Temporary Residential Assistance/Crisis [facility]) M-F 8am-3pm   [address] [place], [place] [address]   [phone] Services include: laundry, barbering, support groups, case management, phone  & computer access, showers, AA/NA mtgs, mental health/substance abuse nurse, job skills class, disability information, VA assistance, spiritual classes, etc.   [facility]   [address], [place]     [place]              [employer] (women and children)       [place]. [place], [place] [address] [phone] [email or web address] for application and process Application Required  [facility]   [address]    [place] [address]     [phone]                    [place],  [address] [phone](OILNZVJK application appt.) Application Required  [facility] (women only)    [address]     [place],  [address]     [phone]  Intake starts 6pm daily Need valid ID, SSC, & Police report [facility] [employer] [address] [place], [place] [phone] Application Required  [employer] (men only)     [place].      [place], [place]       [place] (Pregnant women only) [address] , [place]  [facility]      [place] [address].      [place], [place] [address]     [facility] [address] [place], [place] 90 day commitment/SA/Application process  [facility](men only)     [address]     [place], [place]       Check-in at [facility] of [facility] [address] [place],  [address] [phone] Men/Women/Women and Children must be there by 7 pm  [place], [place]                Polysubstance Abuse When people abuse more than one drug or type of drug it is called polysubstance or polydrug abuse. For example, many smokers also drink alcohol. This is one form of polydrug abuse. Polydrug abuse also refers to the use of a drug to counteract an unpleasant effect produced by another drug. It may also be used to help with withdrawal from another drug. People who take stimulants may become agitated. Sometimes this agitation is countered with a tranquilizer. This helps protect against the unpleasant side effects. Polydrug abuse also refers to the use of different drugs at the same time.  Anytime drug use is interfering with normal living activities, it has become abuse. This includes problems with family and friends. Psychological dependence has developed when your mind tells you that the drug is needed. This is usually followed by physical dependence which has developed when continuing increases of drug are required to get the same feeling or "high". This is known as addiction or chemical dependency. A person's risk is much higher if there is a history of chemical dependency in the family. SIGNS OF CHEMICAL DEPENDENCY  You have been told by friends or family that drugs have become a problem.  You fight when using drugs.  You are having blackouts (not remembering what you do while using).  You feel sick from using drugs but continue  using.  You lie about use or amounts of drugs (chemicals) used.  You need chemicals to get you going.  You are suffering in work performance or in school because of drug use.  You get sick from use of drugs but continue to use anyway.  You need drugs to relate to people or feel comfortable in social situations.  You use drugs to forget problems. "Yes" answered to any of the above signs of chemical dependency indicates there are problems. The longer the use of drugs continues, the greater the problems will become. If there is a family history of drug or alcohol use, it is best not to experiment with these drugs. Continual use leads to tolerance. After tolerance develops more of the drug is needed to get the same feeling. This is followed by addiction. With addiction, drugs become the most important part of life. It becomes  more important to take drugs than participate in the other usual activities of life. This includes relating to friends and family. Addiction is followed by dependency. Dependency is a condition where drugs are now needed not just to get high, but to feel normal. Addiction cannot be cured but it can be stopped. This often requires outside help and the care of professionals. Treatment centers are listed in the yellow pages under: Cocaine, Narcotics, and Alcoholics Anonymous. Most hospitals and clinics can refer you to a specialized care center. Talk to your caregiver if you need help. Document Released: 06/09/2005 Document Revised: 01/10/2012 Document Reviewed: 10/18/2005 [facility] Patient Information 2015 [place], [place]. This information is not intended to replace advice given to you by your health care provider. Make sure you discuss any questions you have with your health care provider.
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
## 2015-04-01 ENCOUNTER — Emergency Department (HOSPITAL_BASED_OUTPATIENT_CLINIC_OR_DEPARTMENT_OTHER): Payer: Self-pay

## 2015-04-01 ENCOUNTER — Encounter (HOSPITAL_BASED_OUTPATIENT_CLINIC_OR_DEPARTMENT_OTHER): Payer: Self-pay | Admitting: Emergency Medicine

## 2015-04-01 ENCOUNTER — Emergency Department (HOSPITAL_BASED_OUTPATIENT_CLINIC_OR_DEPARTMENT_OTHER)
Admission: EM | Admit: 2015-04-01 | Discharge: 2015-04-01 | Disposition: A | Discharge status: Home / Self Care | Payer: Self-pay | Attending: Emergency Medicine | Admitting: Emergency Medicine

## 2015-04-01 DIAGNOSIS — S99922A Unspecified injury of left foot, initial encounter: Secondary | ICD-10-CM | POA: Insufficient documentation

## 2015-04-01 DIAGNOSIS — X58XXXA Exposure to other specified factors, initial encounter: Secondary | ICD-10-CM | POA: Insufficient documentation

## 2015-04-01 DIAGNOSIS — M79672 Pain in left foot: Secondary | ICD-10-CM

## 2015-04-01 DIAGNOSIS — Y9302 Activity, running: Secondary | ICD-10-CM | POA: Insufficient documentation

## 2015-04-01 DIAGNOSIS — Y92838 Other recreation area as the place of occurrence of the external cause: Secondary | ICD-10-CM | POA: Insufficient documentation

## 2015-04-01 DIAGNOSIS — Z720 Tobacco use: Secondary | ICD-10-CM | POA: Insufficient documentation

## 2015-04-01 DIAGNOSIS — Z8719 Personal history of other diseases of the digestive system: Secondary | ICD-10-CM | POA: Insufficient documentation

## 2015-04-01 DIAGNOSIS — Y998 Other external cause status: Secondary | ICD-10-CM | POA: Insufficient documentation

## 2015-04-01 MED ORDER — IBUPROFEN 600 MG PO TABS: 600.0000 mg | ORAL_TABLET | Freq: Three times a day (TID) | ORAL | Status: DC | PRN

## 2015-04-01 NOTE — ED Provider Notes (Signed)
CSN: [id number]     Arrival date & time 04/01/15  1058 History   First MD Initiated Contact with Patient 04/01/15 1119     Chief Complaint  Patient presents with  . Leg Injury     HPI Patient presents emergency department complaining of left foot pain after playing kickball yesterday.  He states he was running in her pop in his left foot.  He reports pain to the plantar surface of his left foot.  No numbness or tingling.  No other significant complaints.  His pain is mild in severity.  Nothing worsens or improves his pain.   Past Medical History  Diagnosis Date  . GERD (gastroesophageal reflux disease)    Past Surgical History  Procedure Laterality Date  . Head injury      hit in head with a chainsaw had staples in head  . Diagnostic laparoscopy      ulcer  . Closed reduction nasal fracture N/A 06/04/2014    Procedure: CLOSED REDUCTION NASAL FRACTURE;  Surgeon: Ascencion Dike, MD;  Location: [place];  Service: ENT;  Laterality: N/A;   Family History  Problem Relation Age of Onset  . Diabetes Maternal Grandmother   . Diabetes Paternal Grandfather   . Hypertension Paternal Grandfather   . Hypertension Father   . Hypertension Mother    History  Substance Use Topics  . Smoking status: Current Every Day Smoker -- 0.50 packs/day for 10 years    Types: Cigarettes  . Smokeless tobacco: Never Used  . Alcohol Use: 0.0 oz/week     Comment: 1-2 drinks per day     Review of Systems  All other systems reviewed and are negative.     Allergies  Review of patient's allergies indicates no known allergies.  Home Medications   Prior to Admission medications   Medication Sig Start Date End Date Taking? Authorizing Provider  ibuprofen (ADVIL,MOTRIN) 600 MG tablet Take 1 tablet (600 mg total) by mouth every 8 (eight) hours as needed. 04/01/15   Jola Schmidt, MD  Multiple Vitamins-Minerals (MULTIVITAMIN ADULT PO) Take 2 tablets by mouth daily.    Historical Provider, MD    BP 127/80 mmHg  Pulse 68  Temp(Src) 98.2 F (36.8 C) (Oral)  Resp 18  Ht 6\' 2"  (1.88 m)  Wt 225 lb (102.059 kg)  BMI 28.88 kg/m2  SpO2 98% Physical Exam  Constitutional: He is oriented to person, place, and time. He appears well-developed and well-nourished.  HENT:  Head: Normocephalic.  Eyes: EOM are normal.  Neck: Normal range of motion.  Pulmonary/Chest: Effort normal.  Abdominal: He exhibits no distension.  Musculoskeletal: Normal range of motion.  Mild generalized tenderness to the plantar surface of his left foot.  Normal pulses in his left foot.  Achilles tendon appears intact as compression of his calf muscle results in plantar flexion of his left foot.  Compartments are soft.  Full range of motion of left ankle left knee.  Neurological: He is alert and oriented to person, place, and time.  Psychiatric: He has a normal mood and affect.  Nursing note and vitals reviewed.   ED Course  Procedures (including critical care time) Labs Review Labs Reviewed - No data to display  Imaging Review Dg Foot Complete Left  04/01/2015   CLINICAL DATA:  Left foot popped when running playing baseball. Pain in arch of foot, swelling.  EXAM: LEFT FOOT - COMPLETE 3+ VIEW  COMPARISON:  None.  FINDINGS: No acute bony abnormality.  Specifically, no fracture, subluxation, or dislocation. Soft tissues are intact. Early joint space narrowing and spurring in the first MTP joint. Small plantar calcaneal spur.  IMPRESSION: No acute bony abnormality.   Electronically Signed   By: Rolm Baptise M.D.   On: 04/01/2015 11:37     EKG Interpretation None      MDM   Final diagnoses:  Left foot pain    X-ray negative.  Suspect pulmonary ligament.  Sports medicine follow-up.  Plain films negative.  Compartments soft.  Charge, good condition.    Jola Schmidt, MD 04/01/15 1153

## 2015-04-01 NOTE — ED Notes (Addendum)
Pt in c/o L foot/leg pain after playing kickball. States he was running to a base and heard a "pop". Ambulatory to triage.

## 2015-07-04 ENCOUNTER — Emergency Department
Admission: EM | Admit: 2015-07-04 | Discharge: 2015-07-04 | Disposition: A | Discharge status: Home / Self Care | Payer: Self-pay | Attending: Emergency Medicine | Admitting: Emergency Medicine

## 2015-07-04 ENCOUNTER — Emergency Department: Payer: Self-pay

## 2015-07-04 DIAGNOSIS — Y9289 Other specified places as the place of occurrence of the external cause: Secondary | ICD-10-CM | POA: Insufficient documentation

## 2015-07-04 DIAGNOSIS — Z720 Tobacco use: Secondary | ICD-10-CM | POA: Insufficient documentation

## 2015-07-04 DIAGNOSIS — Z79899 Other long term (current) drug therapy: Secondary | ICD-10-CM | POA: Insufficient documentation

## 2015-07-04 DIAGNOSIS — X58XXXA Exposure to other specified factors, initial encounter: Secondary | ICD-10-CM | POA: Insufficient documentation

## 2015-07-04 DIAGNOSIS — Y998 Other external cause status: Secondary | ICD-10-CM | POA: Insufficient documentation

## 2015-07-04 DIAGNOSIS — Y936A Activity, physical games generally associated with school recess, summer camp and children: Secondary | ICD-10-CM | POA: Insufficient documentation

## 2015-07-04 DIAGNOSIS — M7732 Calcaneal spur, left foot: Secondary | ICD-10-CM | POA: Insufficient documentation

## 2015-07-04 DIAGNOSIS — M722 Plantar fascial fibromatosis: Secondary | ICD-10-CM | POA: Insufficient documentation

## 2015-07-04 MED ORDER — MELOXICAM 15 MG PO TABS: 15.0000 mg | ORAL_TABLET | Freq: Every day | ORAL | Status: DC

## 2015-07-04 NOTE — ED Notes (Signed)
Left heel pain after playing kickball about 3 mos ago.. conts to have pain in heel  No swelling noted and ambulates  well to treatment room

## 2015-07-04 NOTE — ED Provider Notes (Signed)
[facility] Emergency Department Provider Note ____________________________________________  Time seen: Approximately 12:42 PM  I have reviewed the triage vital signs and the nursing notes.   HISTORY  Chief Complaint Foot Pain   HPI Jeremy Soto is a 49 y.o. male who presents to the emergency department for evaluation of the left heel pain. He states 3 months ago he was running around bases while playing kickball and when he turned to go to second base he felt a pop in his heel. He went to [facility] at that time and had an x-ray. He states that the x-ray was negative. He continues to have pain in the heel. He is taking ibuprofen with some relief.   Past Medical History  Diagnosis Date  . GERD (gastroesophageal reflux disease)     Patient Active Problem List   Diagnosis Date Noted  . Peptic ulcer disease 04/13/2013  . GERD (gastroesophageal reflux disease) 04/13/2013  . Abdominal pain, epigastric 04/13/2013    Past Surgical History  Procedure Laterality Date  . Head injury      hit in head with a chainsaw had staples in head  . Diagnostic laparoscopy      ulcer  . Closed reduction nasal fracture N/A 06/04/2014    Procedure: CLOSED REDUCTION NASAL FRACTURE;  Surgeon: Ascencion Dike, MD;  Location: [place];  Service: ENT;  Laterality: N/A;    Current Outpatient Rx  Name  Route  Sig  Dispense  Refill  . ibuprofen (ADVIL,MOTRIN) 600 MG tablet   Oral   Take 1 tablet (600 mg total) by mouth every 8 (eight) hours as needed.   15 tablet   0   . meloxicam (MOBIC) 15 MG tablet   Oral   Take 1 tablet (15 mg total) by mouth daily.   30 tablet   0   . Multiple Vitamins-Minerals (MULTIVITAMIN ADULT PO)   Oral   Take 2 tablets by mouth daily.           Allergies Review of patient's allergies indicates no known allergies.  Family History  Problem Relation Age of Onset  . Diabetes Maternal Grandmother   . Diabetes  Paternal Grandfather   . Hypertension Paternal Grandfather   . Hypertension Father   . Hypertension Mother     Social History Social History  Substance Use Topics  . Smoking status: Current Every Day Smoker -- 0.50 packs/day for 10 years    Types: Cigarettes  . Smokeless tobacco: Never Used  . Alcohol Use: 0.0 oz/week     Comment: 1-2 drinks per day     Review of Systems Constitutional: No recent illness. Eyes: No visual changes. ENT: No sore throat. Cardiovascular: Denies chest pain or palpitations. Respiratory: Denies shortness of breath. Gastrointestinal: No abdominal pain.  Genitourinary: Negative for dysuria. Musculoskeletal: Pain in left heel. Skin: Negative for rash. Neurological: Negative for headaches, focal weakness or numbness. 10-point ROS otherwise negative.  ____________________________________________   PHYSICAL EXAM:  VITAL SIGNS: ED Triage Vitals  Enc Vitals Group     BP 07/04/15 1105 144/77 mmHg     Pulse Rate 07/04/15 1105 78     Resp 07/04/15 1105 18     Temp 07/04/15 1105 98.4 F (36.9 C)     Temp Source 07/04/15 1105 Oral     SpO2 07/04/15 1105 97 %     Weight 07/04/15 1105 235 lb (106.595 kg)     Height 07/04/15 1105 6\' 3"  (1.905 m)  Head Cir --      Peak Flow --      Pain Score 07/04/15 1106 9     Pain Loc --      Pain Edu? --      Excl. in [place]? --     Constitutional: Alert and oriented. Well appearing and in no acute distress. Eyes: Conjunctivae are normal. EOMI. Head: Atraumatic. Nose: No congestion/rhinnorhea. Neck: No stridor.  Respiratory: Normal respiratory effort.   Musculoskeletal: tenderness to the plantar aspect of left heel. Neurologic:  Normal speech and language. No gross focal neurologic deficits are appreciated. Speech is normal. No gait instability. Skin:  Skin is warm, dry and intact. Atraumatic. Psychiatric: Mood and affect are normal. Speech and behavior are  normal.  ____________________________________________   LABS (all labs ordered are listed, but only abnormal results are displayed)  Labs Reviewed - No data to display ____________________________________________  RADIOLOGY  Heel spur, otherwise negative. ____________________________________________   PROCEDURES  Procedure(s) performed: None   ____________________________________________   INITIAL IMPRESSION / ASSESSMENT AND PLAN / ED COURSE  Pertinent labs & imaging results that were available during my care of the patient were reviewed by me and considered in my medical decision making (see chart for details).  Follow up with the podiatrist. Return to the ER for symptoms that change or worsen if unable to schedule an appointment. ____________________________________________   FINAL CLINICAL IMPRESSION(S) / ED DIAGNOSES  Final diagnoses:  Heel spur, left  Plantar fasciitis, left       Victorino Dike, FNP 07/04/15 1249  Delman Kitten, MD 07/04/15 1527

## 2015-07-04 NOTE — Discharge Instructions (Signed)
Heel Spur °A heel spur is a hook of bone that can form on the calcaneus (the heel bone and the largest bone of the foot). Heel spurs are often associated with plantar fasciitis and usually come in people who have had the problem for an extended period of time. The cause of the relationship is unknown. The pain associated with them is thought to be caused by an inflammation (soreness and redness) of the plantar fascia rather than the spur itself. The plantar fascia is a thick fibrous like tissue that runs from the calcaneus (heel bone) to the ball of the foot. This strong, tight tissue helps maintain the arch of your foot. It helps distribute the weight across your foot as you walk or run. Stresses placed on the plantar fascia can be tremendous. When it is inflamed normal activities become painful. Pain is worse in the morning after sleeping. After sleeping the plantar fascia is tight. The first movements stretch the fascia and this causes pain. As the tendon loosens, the pain usually gets better. It often returns with too much standing or walking.  °About 70% of patients with plantar fasciitis have a heel spur. About half of people without foot pain also have heel spurs. °DIAGNOSIS  °The diagnosis of a heel spur is made by X-ray. The X-ray shows a hook of bone protruding from the bottom of the calcaneus at the point where the plantar fascia is attached to the heel bone.  °TREATMENT °· It is necessary to find out what is causing the stretching of the plantar fascia. If the cause is over-pronation (flat feet), orthotics and proper foot ware may help. °· Stretching exercises, losing weight, wearing shoes that have a cushioned heel that absorbs shock, and elevating the heel with the use of a heel cradle, heel cup, or orthotics may all help. Heel cradles and heel cups provide extra comfort and cushion to the heel, and reduce the amount of shock to the sore area. °AVOIDING THE PAIN OF PLANTAR FASCIITIS AND HEEL  SPURS °· Consult a sports medicine professional before beginning a new exercise program. °· Walking programs offer a good workout. There is a lower chance of overuse injuries common to the runners. There is less impact and less jarring of the joints. °· Begin all new exercise programs slowly. If problems or pains develop, decrease the amount of time or distance until you are at a comfortable level. °· Wear good shoes and replace them regularly. °· Stretch your foot and the heel cords at the back of the ankle (Achilles tendons) both before and after exercise. °· Run or exercise on even surfaces that are not hard. For example, asphalt is better than pavement. °· Do not run barefoot on hard surfaces. °· If using a treadmill, vary the incline. °· Do not continue to workout if you have foot or joint problems. Seek professional help if they do not improve. °HOME CARE INSTRUCTIONS  °· Avoid activities that cause you pain until you recover. °· Use ice or cold packs to the problem or painful areas after working out. °· Only take over-the-counter or prescription medicines for pain, discomfort, or fever as directed by your caregiver. °· Soft shoe inserts or athletic shoes with air or gel sole cushions may be helpful. °· If problems continue or become more severe, consult a sports medicine caregiver. Cortisone is a potent anti-inflammatory medication that may be injected into the painful area. You can discuss this treatment with your caregiver. °MAKE SURE YOU:  °·   Understand these instructions. °· Will watch your condition. °· Will get help right away if you are not doing well or get worse. °Document Released: 11/24/2005 Document Revised: 01/10/2012 Document Reviewed: 12/19/2013 °ExitCare® Patient Information ©2015 [employer]. This information is not intended to replace advice given to you by your health care provider. Make sure you discuss any questions you have with your health care provider. ° °Plantar Fasciitis °Plantar  fasciitis is a common condition that causes foot pain. It is soreness (inflammation) of the band of tough fibrous tissue on the bottom of the foot that runs from the heel bone (calcaneus) to the ball of the foot. The cause of this soreness may be from excessive standing, poor fitting shoes, running on hard surfaces, being overweight, having an abnormal walk, or overuse (this is common in runners) of the painful foot or feet. It is also common in aerobic exercise dancers and ballet dancers. °SYMPTOMS  °Most people with plantar fasciitis complain of: °· Severe pain in the morning on the bottom of their foot especially when taking the first steps out of bed. This pain recedes after a few minutes of walking. °· Severe pain is experienced also during walking following a long period of inactivity. °· Pain is worse when walking barefoot or up stairs °DIAGNOSIS  °· Your caregiver will diagnose this condition by examining and feeling your foot. °· Special tests such as X-rays of your foot, are usually not needed. °PREVENTION  °· Consult a sports medicine professional before beginning a new exercise program. °· Walking programs offer a good workout. With walking there is a lower chance of overuse injuries common to runners. There is less impact and less jarring of the joints. °· Begin all new exercise programs slowly. If problems or pain develop, decrease the amount of time or distance until you are at a comfortable level. °· Wear good shoes and replace them regularly. °· Stretch your foot and the heel cords at the back of the ankle (Achilles tendon) both before and after exercise. °· Run or exercise on even surfaces that are not hard. For example, asphalt is better than pavement. °· Do not run barefoot on hard surfaces. °· If using a treadmill, vary the incline. °· Do not continue to workout if you have foot or joint problems. Seek professional help if they do not improve. °HOME CARE INSTRUCTIONS  °· Avoid activities that  cause you pain until you recover. °· Use ice or cold packs on the problem or painful areas after working out. °· Only take over-the-counter or prescription medicines for pain, discomfort, or fever as directed by your caregiver. °· Soft shoe inserts or athletic shoes with air or gel sole cushions may be helpful. °· If problems continue or become more severe, consult a sports medicine caregiver or your own health care provider. Cortisone is a potent anti-inflammatory medication that may be injected into the painful area. You can discuss this treatment with your caregiver. °MAKE SURE YOU:  °· Understand these instructions. °· Will watch your condition. °· Will get help right away if you are not doing well or get worse. °Document Released: 07/13/2001 Document Revised: 01/10/2012 Document Reviewed: 09/11/2008 °ExitCare® Patient Information ©2015 [employer]. This information is not intended to replace advice given to you by your health care provider. Make sure you discuss any questions you have with your health care provider. ° °

## 2015-07-04 NOTE — ED Notes (Signed)
Pt c/o left heel pain since injuring it 3 months ago playing kick ball.

## 2015-07-15 ENCOUNTER — Ambulatory Visit: Payer: Self-pay

## 2015-07-24 ENCOUNTER — Ambulatory Visit: Payer: Self-pay

## 2015-07-30 ENCOUNTER — Ambulatory Visit: Payer: Self-pay | Admitting: Ophthalmology

## 2015-07-31 ENCOUNTER — Other Ambulatory Visit: Payer: Self-pay

## 2015-08-06 ENCOUNTER — Ambulatory Visit: Payer: Self-pay | Admitting: Specialist

## 2015-09-18 ENCOUNTER — Other Ambulatory Visit: Payer: Self-pay

## 2016-01-18 ENCOUNTER — Inpatient Hospital Stay (HOSPITAL_COMMUNITY): Payer: Medicaid Other

## 2016-01-18 ENCOUNTER — Inpatient Hospital Stay (HOSPITAL_COMMUNITY)
Admission: EM | Admit: 2016-01-18 | Discharge: 2016-01-20 | DRG: 054 | Disposition: A | Discharge status: Home / Self Care | Payer: Medicaid Other | Attending: Internal Medicine | Admitting: Internal Medicine

## 2016-01-18 ENCOUNTER — Encounter (HOSPITAL_COMMUNITY): Payer: Self-pay | Admitting: Emergency Medicine

## 2016-01-18 ENCOUNTER — Emergency Department (HOSPITAL_COMMUNITY): Payer: Medicaid Other

## 2016-01-18 DIAGNOSIS — F17210 Nicotine dependence, cigarettes, uncomplicated: Secondary | ICD-10-CM | POA: Diagnosis present

## 2016-01-18 DIAGNOSIS — G936 Cerebral edema: Secondary | ICD-10-CM | POA: Diagnosis present

## 2016-01-18 DIAGNOSIS — I6350 Cerebral infarction due to unspecified occlusion or stenosis of unspecified cerebral artery: Secondary | ICD-10-CM

## 2016-01-18 DIAGNOSIS — I639 Cerebral infarction, unspecified: Secondary | ICD-10-CM | POA: Diagnosis present

## 2016-01-18 DIAGNOSIS — E785 Hyperlipidemia, unspecified: Secondary | ICD-10-CM | POA: Diagnosis present

## 2016-01-18 DIAGNOSIS — D329 Benign neoplasm of meninges, unspecified: Secondary | ICD-10-CM | POA: Diagnosis present

## 2016-01-18 DIAGNOSIS — I6789 Other cerebrovascular disease: Secondary | ICD-10-CM | POA: Diagnosis not present

## 2016-01-18 DIAGNOSIS — R569 Unspecified convulsions: Secondary | ICD-10-CM | POA: Diagnosis present

## 2016-01-18 DIAGNOSIS — Z833 Family history of diabetes mellitus: Secondary | ICD-10-CM | POA: Diagnosis not present

## 2016-01-18 DIAGNOSIS — Z720 Tobacco use: Secondary | ICD-10-CM | POA: Diagnosis present

## 2016-01-18 DIAGNOSIS — F1410 Cocaine abuse, uncomplicated: Secondary | ICD-10-CM | POA: Diagnosis present

## 2016-01-18 DIAGNOSIS — Z79899 Other long term (current) drug therapy: Secondary | ICD-10-CM | POA: Diagnosis not present

## 2016-01-18 DIAGNOSIS — R471 Dysarthria and anarthria: Secondary | ICD-10-CM | POA: Diagnosis present

## 2016-01-18 DIAGNOSIS — R32 Unspecified urinary incontinence: Secondary | ICD-10-CM | POA: Diagnosis present

## 2016-01-18 DIAGNOSIS — R51 Headache: Secondary | ICD-10-CM

## 2016-01-18 DIAGNOSIS — E1122 Type 2 diabetes mellitus with diabetic chronic kidney disease: Secondary | ICD-10-CM | POA: Diagnosis present

## 2016-01-18 DIAGNOSIS — R519 Headache, unspecified: Secondary | ICD-10-CM

## 2016-01-18 DIAGNOSIS — Z7982 Long term (current) use of aspirin: Secondary | ICD-10-CM

## 2016-01-18 DIAGNOSIS — G40409 Other generalized epilepsy and epileptic syndromes, not intractable, without status epilepticus: Secondary | ICD-10-CM | POA: Diagnosis not present

## 2016-01-18 DIAGNOSIS — N182 Chronic kidney disease, stage 2 (mild): Secondary | ICD-10-CM | POA: Diagnosis present

## 2016-01-18 LAB — HEPATIC FUNCTION PANEL
ALBUMIN: 4.4 g/dL (ref 3.5–5.0)
ALT: 13 U/L — ABNORMAL LOW (ref 17–63)
AST: 19 U/L (ref 15–41)
Alkaline Phosphatase: 70 U/L (ref 38–126)
Bilirubin, Direct: <0.1 mg/dL — ABNORMAL LOW (ref 0.1–0.5)
TOTAL PROTEIN: 7.6 g/dL (ref 6.5–8.1)
Total Bilirubin: 0.5 mg/dL (ref 0.3–1.2)

## 2016-01-18 LAB — BASIC METABOLIC PANEL
Anion gap: 10 (ref 5–15)
BUN: 16 mg/dL (ref 6–20)
CO2: 26 mmol/L (ref 22–32)
CREATININE: 1.44 mg/dL — AB (ref 0.61–1.24)
Calcium: 9.6 mg/dL (ref 8.9–10.3)
Chloride: 103 mmol/L (ref 101–111)
GFR calc Af Amer: >60 mL/min (ref >60)
GFR, EST NON AFRICAN AMERICAN: 56 mL/min — AB (ref >60)
GLUCOSE: 102 mg/dL — AB (ref 65–99)
Potassium: 4 mmol/L (ref 3.5–5.1)
Sodium: 139 mmol/L (ref 135–145)

## 2016-01-18 LAB — CBC
HEMATOCRIT: 38.5 % — AB (ref 39.0–52.0)
Hemoglobin: 13.2 g/dL (ref 13.0–17.0)
MCH: 30.5 pg (ref 26.0–34.0)
MCHC: 34.3 g/dL (ref 30.0–36.0)
MCV: 88.9 fL (ref 78.0–100.0)
PLATELETS: 262 10*3/uL (ref 150–400)
RBC: 4.33 MIL/uL (ref 4.22–5.81)
RDW: 12.5 % (ref 11.5–15.5)
WBC: 7 10*3/uL (ref 4.0–10.5)

## 2016-01-18 LAB — APTT: APTT: 36 s (ref 24–37)

## 2016-01-18 LAB — RAPID URINE DRUG SCREEN, HOSP PERFORMED
Amphetamines: NOT DETECTED
BENZODIAZEPINES: NOT DETECTED
Barbiturates: NOT DETECTED
COCAINE: POSITIVE — AB
Opiates: NOT DETECTED
Tetrahydrocannabinol: NOT DETECTED

## 2016-01-18 LAB — URINALYSIS, ROUTINE W REFLEX MICROSCOPIC
BILIRUBIN URINE: NEGATIVE
Glucose, UA: NEGATIVE mg/dL
HGB URINE DIPSTICK: NEGATIVE
Ketones, ur: NEGATIVE mg/dL
Leukocytes, UA: NEGATIVE
NITRITE: NEGATIVE
PROTEIN: NEGATIVE mg/dL
Specific Gravity, Urine: 1.016 (ref 1.005–1.030)
pH: 5.5 (ref 5.0–8.0)

## 2016-01-18 LAB — ETHANOL

## 2016-01-18 LAB — CBG MONITORING, ED: Glucose-Capillary: 111 mg/dL — ABNORMAL HIGH (ref 65–99)

## 2016-01-18 LAB — PROTIME-INR
INR: 0.95 (ref 0.00–1.49)
PROTHROMBIN TIME: 12.9 s (ref 11.6–15.2)

## 2016-01-18 MED ORDER — STROKE: EARLY STAGES OF RECOVERY BOOK
Freq: Once | Status: DC
  Administered 2016-01-19: 03:00:00
  Filled 2016-01-18: qty 1

## 2016-01-18 MED ORDER — ASPIRIN 300 MG RE SUPP: 300.0000 mg | Freq: Every day | RECTAL | Status: DC

## 2016-01-18 MED ORDER — ACETAMINOPHEN 325 MG PO TABS: 650.0000 mg | ORAL_TABLET | ORAL | Status: DC | PRN

## 2016-01-18 MED ORDER — ENOXAPARIN SODIUM 40 MG/0.4ML ~~LOC~~ SOLN
40.0000 mg | SUBCUTANEOUS | Status: DC
Start: 2016-01-19 — End: 2016-01-20
  Administered 2016-01-19 – 2016-01-20 (×2): 40 mg via SUBCUTANEOUS
  Filled 2016-01-18 (×2): qty 0.4

## 2016-01-18 MED ORDER — ACETAMINOPHEN 650 MG RE SUPP: 650.0000 mg | RECTAL | Status: DC | PRN

## 2016-01-18 MED ORDER — NICOTINE 14 MG/24HR TD PT24
14.0000 mg | MEDICATED_PATCH | Freq: Every day | TRANSDERMAL | Status: DC
  Administered 2016-01-18 – 2016-01-20 (×3): 14 mg via TRANSDERMAL
  Filled 2016-01-18 (×3): qty 1

## 2016-01-18 MED ORDER — ASPIRIN 325 MG PO TABS
325.0000 mg | ORAL_TABLET | Freq: Every day | ORAL | Status: DC
  Administered 2016-01-19: 325 mg via ORAL
  Filled 2016-01-18: qty 1

## 2016-01-18 MED ORDER — SODIUM CHLORIDE 0.9 % IV SOLN
1000.0000 mg | Freq: Two times a day (BID) | INTRAVENOUS | Status: DC
  Administered 2016-01-18 – 2016-01-19 (×3): 1000 mg via INTRAVENOUS
  Filled 2016-01-18 (×6): qty 10

## 2016-01-18 MED ORDER — ONDANSETRON HCL 4 MG/2ML IJ SOLN: 4.0000 mg | Freq: Four times a day (QID) | INTRAMUSCULAR | Status: DC | PRN

## 2016-01-18 MED ORDER — ASPIRIN 81 MG PO CHEW
324.0000 mg | CHEWABLE_TABLET | Freq: Once | ORAL | Status: AC
  Administered 2016-01-18: 324 mg via ORAL
  Filled 2016-01-18: qty 4

## 2016-01-18 MED ORDER — SENNOSIDES-DOCUSATE SODIUM 8.6-50 MG PO TABS: 1.0000 | ORAL_TABLET | Freq: Every evening | ORAL | Status: DC | PRN

## 2016-01-18 NOTE — ED Provider Notes (Signed)
CSN: [id number]     Arrival date & time 01/18/16  1422 History   First MD Initiated Contact with Patient 01/18/16 1504     Chief Complaint  Patient presents with  . Seizures     (Consider location/radiation/quality/duration/timing/severity/associated sxs/prior Treatment) HPI Comments: Patient presents to the ED with a chief complaint of seizure.  Patient had a seizure which lasted approximately 5 minutes consisting of generalized full body shaking.  Patient was unaware at the time and was post-ictal afterward.  EMS was called by family members however patient refused transportation to the hospital. Family members report that the patient has had some maintained slurred speech as well as seeming "fuzzy" since the seizure last night. He reports having a headache that is located on the right side of his head. Complains of pressure behind his eyes. He denies any recent fevers, chills, numbness, weakness, or tingling. There are no modifying factors. Patient has some persistent confusion including difficulty with date.  The history is provided by the patient. No language interpreter was used.    Past Medical History  Diagnosis Date  . GERD (gastroesophageal reflux disease)    Past Surgical History  Procedure Laterality Date  . Head injury      hit in head with a chainsaw had staples in head  . Diagnostic laparoscopy      ulcer  . Closed reduction nasal fracture N/A 06/04/2014    Procedure: CLOSED REDUCTION NASAL FRACTURE;  Surgeon: Ascencion Dike, MD;  Location: [place];  Service: ENT;  Laterality: N/A;   Family History  Problem Relation Age of Onset  . Diabetes Maternal Grandmother   . Diabetes Paternal Grandfather   . Hypertension Paternal Grandfather   . Hypertension Father   . Hypertension Mother    Social History  Substance Use Topics  . Smoking status: Current Every Day Smoker -- 0.50 packs/day for 10 years    Types: Cigarettes  . Smokeless tobacco: Never Used  .  Alcohol Use: 0.0 oz/week     Comment: 1-2 drinks per day     Review of Systems  Neurological: Positive for seizures.  All other systems reviewed and are negative.     Allergies  Review of patient's allergies indicates no known allergies.  Home Medications   Prior to Admission medications   Medication Sig Start Date End Date Taking? Authorizing Provider  meloxicam (MOBIC) 15 MG tablet Take 1 tablet (15 mg total) by mouth daily. 07/04/15   Cari B Triplett, FNP   BP 159/94 mmHg  Pulse 87  Temp(Src) 98.1 F (36.7 C) (Oral)  Resp 18  SpO2 100% Physical Exam  Constitutional: He is oriented to person, place, and time. He appears well-developed and well-nourished.  HENT:  Head: Normocephalic and atraumatic.  Right Ear: External ear normal.  Left Ear: External ear normal.  Eyes: Conjunctivae and EOM are normal. Pupils are equal, round, and reactive to light.  Neck: Normal range of motion. Neck supple.  No pain with neck flexion, no meningismus  Cardiovascular: Normal rate, regular rhythm and normal heart sounds.  Exam reveals no gallop and no friction rub.   No murmur heard. Pulmonary/Chest: Effort normal and breath sounds normal. No respiratory distress. He has no wheezes. He has no rales. He exhibits no tenderness.  Abdominal: Soft. He exhibits no distension and no mass. There is no tenderness. There is no rebound and no guarding.  Musculoskeletal: Normal range of motion. He exhibits no edema or tenderness.  Normal gait.  Neurological: He is alert and oriented to person, place, and time. He has normal reflexes.  CN 3-12 intact, normal finger to nose, no pronator drift, normal heel to shin, normal visual fields, normal finger counting, sensation and strength intact bilaterally.  Skin: Skin is warm and dry.  Psychiatric: He has a normal mood and affect. His behavior is normal. Judgment and thought content normal.  Nursing note and vitals reviewed.   ED Course  Procedures  (including critical care time) Results for orders placed or performed during the hospital encounter of [id number]  Basic metabolic panel - if new onset seizures  Result Value Ref Range   Sodium 139 135 - 145 mmol/L   Potassium 4.0 3.5 - 5.1 mmol/L   Chloride 103 101 - 111 mmol/L   CO2 26 22 - 32 mmol/L   Glucose, Bld 102 (H) 65 - 99 mg/dL   BUN 16 6 - 20 mg/dL   Creatinine, Ser 1.44 (H) 0.61 - 1.24 mg/dL   Calcium 9.6 8.9 - 10.3 mg/dL   GFR calc non Af Amer 56 (L) >60 mL/min   GFR calc Af Amer >60 >60 mL/min   Anion gap 10 5 - 15  CBC - if new onset seizures  Result Value Ref Range   WBC 7.0 4.0 - 10.5 K/uL   RBC 4.33 4.22 - 5.81 MIL/uL   Hemoglobin 13.2 13.0 - 17.0 g/dL   HCT 38.5 (L) 39.0 - 52.0 %   MCV 88.9 78.0 - 100.0 fL   MCH 30.5 26.0 - 34.0 pg   MCHC 34.3 30.0 - 36.0 g/dL   RDW 12.5 11.5 - 15.5 %   Platelets 262 150 - 400 K/uL  Urine rapid drug screen (hosp performed)  Result Value Ref Range   Opiates NONE DETECTED NONE DETECTED   Cocaine POSITIVE (A) NONE DETECTED   Benzodiazepines NONE DETECTED NONE DETECTED   Amphetamines NONE DETECTED NONE DETECTED   Tetrahydrocannabinol NONE DETECTED NONE DETECTED   Barbiturates NONE DETECTED NONE DETECTED  Urinalysis, Routine w reflex microscopic (not at [facility])  Result Value Ref Range   Color, Urine YELLOW YELLOW   APPearance CLEAR CLEAR   Specific Gravity, Urine 1.016 1.005 - 1.030   pH 5.5 5.0 - 8.0   Glucose, UA NEGATIVE NEGATIVE mg/dL   Hgb urine dipstick NEGATIVE NEGATIVE   Bilirubin Urine NEGATIVE NEGATIVE   Ketones, ur NEGATIVE NEGATIVE mg/dL   Protein, ur NEGATIVE NEGATIVE mg/dL   Nitrite NEGATIVE NEGATIVE   Leukocytes, UA NEGATIVE NEGATIVE  CBG monitoring, ED  Result Value Ref Range   Glucose-Capillary 111 (H) 65 - 99 mg/dL   Ct Head Wo Contrast  01/18/2016  CLINICAL DATA:  Recent seizure activity EXAM: CT HEAD WITHOUT CONTRAST TECHNIQUE: Contiguous axial images were obtained from the base of the skull through  the vertex without intravenous contrast. COMPARISON:  05/01/2014 FINDINGS: Somewhat ovoid area of decreased attenuation is noted in the right frontal lobe consistent with an acute infarct. This measures approximately 4.0 by 1.7 cm. No other areas of apparent ischemia are noted. IMPRESSION: Acute ischemia in the right frontal lobe Critical Value/emergent results were called by telephone at the time of interpretation on 01/18/2016 at 4:48 pm to Dr. Joelene Millin, who verbally acknowledged these results. Electronically Signed   By: Inez Catalina M.D.   On: 01/18/2016 16:52    I have personally reviewed and evaluated these images and lab results as part of my medical decision-making.   EKG Interpretation None  MDM   Final diagnoses:  Cerebrovascular accident (CVA), unspecified mechanism ([place])  Seizure ([place])  Nonintractable headache, unspecified chronicity pattern, unspecified headache type    Patient with new onset seizure last night. Has had some persistent slurred speech, as well as some confusion. Difficulty with date and time. He has no numbness or weakness. Cranial nerves are intact. Still complains of right-sided headache. Will check CT head, and treat headache afterward.  No fevers, no neck stiffness. Doubt meningitis.  CT scan remarkable for acute ischemic stroke. Symptom onset was last night at 11:30 following his seizure. Patient discussed with Dr. Roderic Palau, who recommends consultation with neurology. Discussed patient with Dr. Silverio Decamp, who recommends [facility] and admission for stroke workup. Also recommend starting 1 g of Keppra twice a day and aspirin.    Montine Circle, PA-C 01/18/16 1910  Milton Ferguson, MD 01/21/16 2009

## 2016-01-18 NOTE — ED Notes (Signed)
Attempted to call report x 1  

## 2016-01-18 NOTE — Progress Notes (Signed)
Patient admitted from [facility] ED alert and oriented X4.  Oriented to room and unit. Tele attached and verified X2 persons. Admission notified.

## 2016-01-18 NOTE — ED Notes (Addendum)
Per pt family, he had full body seizure like activity lasting app 5 min, no hx of seizures. Pt refused to be seen last night. Today pt c/o headache behind left eye and also stiffness in the neck. Pt CAO x4 at present answering all questions appropriately.

## 2016-01-18 NOTE — ED Notes (Signed)
Pt had a seizure last night, witnessed, full body shaking. No hx of seizures. Full headache. EMS evaluated pt last night, stated he was post ictal. Pt refused to come in last night. Pt has tried OTC pain medication for headache without relief. Hx of untreated HTN also. Pt states he feels like he's going to pass out in triage, can't remember what year it is, occasionally slurring words.

## 2016-01-18 NOTE — H&P (Addendum)
History and Physical  Jeremy Soto [id number] DOB: [birth year] DOA: 01/18/2016   PCP: No PCP Per Patient  Referring Physician: ED/ Montine Circle, PA-C  Chief Complaint: seizure, slurred speech  HPI:  50 year old male with no chronic medical problems presented with slurred speech of 1 day duration. The patient had a witnessed seizure on the evening of 01/17/2016. This was witnessed by the patient's current friend who described tonic-clonic activity as well as the patient's right arm being clenched to his chest and drooling from his mouth. The patient also was incontinent of urine during episode. The episode lasted approximately 5 minutes after which, the patient was confused for approximately 15 minutes. EMS was activated at that time, but the patient refused to come to the hospital. After the episode, the patient was noted to have some dysarthria as well as a right-sided headache. When he woke up on the morning of 01/18/2016 his right headache and dysarthria persisted. As a result, the patient activated EMS.  Patient denies fevers, chills, headache, chest pain, dyspnea, nausea, vomiting, diarrhea, abdominal pain, dysuria, hematuria In the emergency department, the patient was afebrile and hemodynamically stable with oxygen saturation 100% on room air. BMP was unremarkable except for serum creatinine of 1.44. CBC was essentially unremarkable. CT of the brain showed acute ischemia of the right frontal lobe. Neurology was contacted from the emergency department and recommended transfer to [facility] for further workup. Neurology also has ordered MRI of the brain with and without gadolinium. Assessment/Plan: Acute frontal stroke -Noted on CT brain -MRI brain -MRA brain -Carotid duplex -Echocardiogram -Hemoglobin A1c -Lipid panel -PT/OT/ST -Continue aspirin 325 mg daily -ED spoke with Dr. 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 GERD (gastroesophageal reflux disease)    Past Surgical History  Procedure Laterality Date  . Head injury      hit in head with a chainsaw had staples in head  . Diagnostic laparoscopy      ulcer  . Closed reduction nasal fracture N/A 06/04/2014    Procedure: CLOSED REDUCTION NASAL FRACTURE;  Surgeon: Ascencion Dike, MD;  Location: [place];  Service: ENT;  Laterality: N/A;   Social History:  reports that he has been smoking Cigarettes.  He has a 5 pack-year smoking history. He has never used smokeless tobacco. He reports that he drinks alcohol. He reports that he does not use illicit drugs.   Family History  Problem Relation Age of Onset  . Diabetes Maternal Grandmother   . Diabetes Paternal Grandfather   . Hypertension Paternal Grandfather   . Hypertension Father   . Hypertension Mother      No Known Allergies    Prior to Admission medications   Medication Sig Start Date End Date Taking? Authorizing Provider  meloxicam (MOBIC) 15 MG tablet Take 1 tablet (15 mg total) by mouth daily. 07/04/15   Victorino Dike, FNP    Review of Systems:  Constitutional:  No weight loss, night sweats, Fevers, chills, fatigue.  Head&Eyes: No headache.  No vision loss.  No eye  pain or scotoma ENT:  No Difficulty swallowing,Tooth/dental problems,Sore throat,  No ear ache, post nasal drip,  Cardio-vascular:  No chest pain, Orthopnea, PND, swelling in lower extremities,  dizziness, palpitations  GI:  No  abdominal pain, nausea, vomiting, diarrhea, loss of appetite, hematochezia,  melena, heartburn, indigestion, Resp:  No shortness of breath with exertion or at rest. No cough. No coughing up of blood .No wheezing.No chest wall deformity  Skin:  no rash or lesions.  GU:  no dysuria, change in color of urine, no urgency or frequency. No flank pain.  Musculoskeletal:  No joint pain or swelling. No decreased range of motion. No back pain.  Psych:  No change in mood or affect. No depression or anxiety. Neurologic:  no dysesthesia, no focal weakness, no vision loss  Physical Exam: Filed Vitals:   01/18/16 1630 01/18/16 1646 01/18/16 1742 01/18/16 1751  BP: 125/80 158/92  116/74  Pulse:  68  98  Temp:   98.4 F (36.9 C)   TempSrc:      Resp: 20 18  15   SpO2:  100%  99%   General:  A&O x 3, NAD, nontoxic, pleasant/cooperative Head/Eye: No conjunctival hemorrhage, no icterus, [place]/AT, No nystagmus ENT:  No icterus,  No thrush, good dentition, no pharyngeal exudate Neck:  No masses, no lymphadenpathy, no bruits CV:  RRR, no rub, no gallop, no S3 Lung:  CTAB, good air movement, no wheeze, no rhonchi Abdomen: soft/NT, +BS, nondistended, no peritoneal signs Ext: No cyanosis, No rashes, No petechiae, No lymphangitis, No edema Neuro: CNII-XII intact, strength 4/5 in bilateral upper and lower extremities, no dysmetria  Labs on Admission:  Basic Metabolic Panel:  Recent Labs Lab 01/18/16 1452  NA 139  K 4.0  CL 103  CO2 26  GLUCOSE 102*  BUN 16  CREATININE 1.44*  CALCIUM 9.6   Liver Function Tests: No results for input(s): AST, ALT, ALKPHOS, BILITOT, PROT, ALBUMIN in the last 168 hours. No results for input(s): LIPASE, AMYLASE in the last 168 hours. No results for input(s): AMMONIA in the last 168 hours. CBC:  Recent Labs Lab 01/18/16 1452  WBC 7.0  HGB 13.2  HCT 38.5*  MCV 88.9  PLT 262   Cardiac Enzymes: No results for input(s): CKTOTAL, CKMB, CKMBINDEX, TROPONINI in the last 168 hours. BNP: Invalid input(s): POCBNP CBG:  Recent Labs Lab  01/18/16 1454  GLUCAP 111*    Radiological Exams on Admission: Ct Head Wo Contrast  01/18/2016  CLINICAL DATA:  Recent seizure activity EXAM: CT HEAD WITHOUT CONTRAST TECHNIQUE: Contiguous axial images were obtained from the base of the skull through the vertex without intravenous contrast. COMPARISON:  05/01/2014 FINDINGS: Somewhat ovoid area of decreased attenuation is noted in the right frontal lobe consistent with an acute infarct. This measures approximately 4.0 by 1.7 cm. No other areas of apparent ischemia are noted. IMPRESSION: Acute ischemia in the right frontal lobe Critical Value/emergent results were called by telephone at the time of interpretation on 01/18/2016 at 4:48 pm to Dr. Joelene Millin, who verbally acknowledged these results. Electronically Signed   By: Inez Catalina M.D.   On: 01/18/2016 16:52    EKG: Independently reviewed. pending    Time spent:60 minutes Code Status:   FULL Family Communication:   Mother and girlfriend updated at bedside   Jeremy Overholser, DO  [facility] Pager [phone]  If 7PM-7AM, please contact night-coverage [email or web address] Password [facility] 01/18/2016, 5:54 PM

## 2016-01-19 ENCOUNTER — Inpatient Hospital Stay (HOSPITAL_COMMUNITY)
Admit: 2016-01-19 | Discharge: 2016-01-19 | Disposition: A | Discharge status: Home / Self Care | Payer: Medicaid Other | Attending: Internal Medicine | Admitting: Internal Medicine

## 2016-01-19 ENCOUNTER — Inpatient Hospital Stay (HOSPITAL_COMMUNITY): Payer: Medicaid Other

## 2016-01-19 DIAGNOSIS — I6789 Other cerebrovascular disease: Secondary | ICD-10-CM

## 2016-01-19 DIAGNOSIS — D329 Benign neoplasm of meninges, unspecified: Secondary | ICD-10-CM | POA: Diagnosis present

## 2016-01-19 DIAGNOSIS — N182 Chronic kidney disease, stage 2 (mild): Secondary | ICD-10-CM

## 2016-01-19 DIAGNOSIS — G40409 Other generalized epilepsy and epileptic syndromes, not intractable, without status epilepticus: Secondary | ICD-10-CM

## 2016-01-19 LAB — LIPID PANEL
CHOLESTEROL: 274 mg/dL — AB (ref 0–200)
HDL: 37 mg/dL — AB (ref >40)
LDL CALC: 194 mg/dL — AB (ref 0–99)
TRIGLYCERIDES: 213 mg/dL — AB (ref <150)
Total CHOL/HDL Ratio: 7.4 RATIO
VLDL: 43 mg/dL — AB (ref 0–40)

## 2016-01-19 LAB — ECHOCARDIOGRAM COMPLETE
Height: 75 in
Weight: 3809.6 oz

## 2016-01-19 LAB — HIV ANTIBODY (ROUTINE TESTING W REFLEX): HIV Screen 4th Generation wRfx: NONREACTIVE

## 2016-01-19 LAB — CK: CK TOTAL: 419 U/L — AB (ref 49–397)

## 2016-01-19 MED ORDER — GADOBENATE DIMEGLUMINE 529 MG/ML IV SOLN
20.0000 mL | Freq: Once | INTRAVENOUS | Status: AC | PRN
  Administered 2016-01-19: 20 mL via INTRAVENOUS

## 2016-01-19 MED ORDER — SIMVASTATIN 20 MG PO TABS
20.0000 mg | ORAL_TABLET | Freq: Every day | ORAL | Status: DC
  Administered 2016-01-19: 20 mg via ORAL
  Filled 2016-01-19: qty 1

## 2016-01-19 MED ORDER — DEXAMETHASONE 2 MG PO TABS
2.0000 mg | ORAL_TABLET | Freq: Two times a day (BID) | ORAL | Status: DC
  Administered 2016-01-19 – 2016-01-20 (×3): 2 mg via ORAL
  Filled 2016-01-19 (×3): qty 1

## 2016-01-19 NOTE — Consult Note (Signed)
Reason for Consult: Right frontal meningioma Referring Physician: Dr. Verlee Monte  Jeremy Soto is an 50 y.o. male.  HPI: Jeremy Soto is a 50 year old right-hand-dominant individual who presented with a new onset of seizure yesterday evening. This was witnessed by his girlfriend lasted approximately 5 minutes and included tonic-clonic movements of the extremities. 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 There is surrounding edema in the white matter. The patient now feels well in the studies had some significant frontal headaches over the past couple of months and was wondering if these could be related.  His past medical history is otherwise significant for some diabetes he's been treating with controlled with diet for a number of years in addition to some hypertension. His general health has otherwise been good and he reports no other medical issues. he is a smoker.  Past Medical History  Diagnosis Date  . GERD (gastroesophageal reflux disease)     Past Surgical History  Procedure Laterality Date  . Head injury      hit in head with a chainsaw had staples in head  . Diagnostic laparoscopy      ulcer  . Closed reduction nasal fracture N/A 06/04/2014    Procedure: CLOSED REDUCTION NASAL FRACTURE;  Surgeon: Ascencion Dike, MD;  Location: [facility];  Service: ENT;  Laterality: N/A;    Family History  Problem Relation Age of Onset  . Diabetes Maternal Grandmother   . Diabetes Paternal Grandfather   . Hypertension Paternal Grandfather   . Hypertension Father   . Hypertension Mother     Social History:  reports that he has been smoking Cigarettes.  He has a 5 pack-year smoking history. He has never used smokeless  tobacco. He reports that he drinks alcohol. He reports that he does not use illicit drugs.  Allergies: No Known Allergies  Medications: I have reviewed the patient's current medications.  Results for orders placed or performed during the hospital encounter of 01/18/16 (from the past 48 hour(s))  Basic metabolic panel - if new onset seizures     Status: Abnormal   Collection Time: 01/18/16  2:52 PM  Result Value Ref Range   Sodium 139 135 - 145 mmol/L   Potassium 4.0 3.5 - 5.1 mmol/L   Chloride 103 101 - 111 mmol/L   CO2 26 22 - 32 mmol/L   Glucose, Bld 102 (H) 65 - 99 mg/dL   BUN 16 6 - 20 mg/dL   Creatinine, Ser 1.44 (H) 0.61 - 1.24 mg/dL   Calcium 9.6 8.9 - 10.3 mg/dL   GFR calc non Af Amer 56 (L) >60 mL/min   GFR calc Af Amer >60 >60 mL/min    Comment: (NOTE) The eGFR has been calculated using the CKD EPI equation. This calculation has not been validated in all clinical situations. eGFR's persistently <60 mL/min signify possible Chronic Kidney Disease.    Anion gap 10 5 - 15  CBC - if new onset seizures     Status: Abnormal   Collection Time: 01/18/16  2:52 PM  Result Value Ref Range   WBC 7.0 4.0 - 10.5 K/uL   RBC 4.33 4.22 - 5.81 MIL/uL   Hemoglobin 13.2 13.0 - 17.0 g/dL  HCT 38.5 (L) 39.0 - 52.0 %   MCV 88.9 78.0 - 100.0 fL   MCH 30.5 26.0 - 34.0 pg   MCHC 34.3 30.0 - 36.0 g/dL   RDW 12.5 11.5 - 15.5 %   Platelets 262 150 - 400 K/uL  CBG monitoring, ED     Status: Abnormal   Collection Time: 01/18/16  2:54 PM  Result Value Ref Range   Glucose-Capillary 111 (H) 65 - 99 mg/dL  Urine rapid drug screen (hosp performed)     Status: Abnormal   Collection Time: 01/18/16  5:48 PM  Result Value Ref Range   Opiates NONE DETECTED NONE DETECTED   Cocaine POSITIVE (A) NONE DETECTED   Benzodiazepines NONE DETECTED NONE DETECTED   Amphetamines NONE DETECTED NONE DETECTED   Tetrahydrocannabinol NONE DETECTED NONE DETECTED   Barbiturates NONE DETECTED NONE DETECTED     Comment:        DRUG SCREEN FOR MEDICAL PURPOSES ONLY.  IF CONFIRMATION IS NEEDED FOR ANY PURPOSE, NOTIFY LAB WITHIN 5 DAYS.        LOWEST DETECTABLE LIMITS FOR URINE DRUG SCREEN Drug Class       Cutoff (ng/mL) Amphetamine      1000 Barbiturate      200 Benzodiazepine   034 Tricyclics       917 Opiates          300 Cocaine          300 THC              50   Ethanol     Status: None   Collection Time: 01/18/16  5:48 PM  Result Value Ref Range   Alcohol, Ethyl (B) <5 <5 mg/dL    Comment:        LOWEST DETECTABLE LIMIT FOR SERUM ALCOHOL IS 5 mg/dL FOR MEDICAL PURPOSES ONLY   Protime-INR     Status: None   Collection Time: 01/18/16  5:48 PM  Result Value Ref Range   Prothrombin Time 12.9 11.6 - 15.2 seconds   INR 0.95 0.00 - 1.49  APTT     Status: None   Collection Time: 01/18/16  5:48 PM  Result Value Ref Range   aPTT 36 24 - 37 seconds  Urinalysis, Routine w reflex microscopic (not at [facility])     Status: None   Collection Time: 01/18/16  5:48 PM  Result Value Ref Range   Color, Urine YELLOW YELLOW   APPearance CLEAR CLEAR   Specific Gravity, Urine 1.016 1.005 - 1.030   pH 5.5 5.0 - 8.0   Glucose, UA NEGATIVE NEGATIVE mg/dL   Hgb urine dipstick NEGATIVE NEGATIVE   Bilirubin Urine NEGATIVE NEGATIVE   Ketones, ur NEGATIVE NEGATIVE mg/dL   Protein, ur NEGATIVE NEGATIVE mg/dL   Nitrite NEGATIVE NEGATIVE   Leukocytes, UA NEGATIVE NEGATIVE    Comment: MICROSCOPIC NOT DONE ON URINES WITH NEGATIVE PROTEIN, BLOOD, LEUKOCYTES, NITRITE, OR GLUCOSE <1000 mg/dL.  Hepatic function panel     Status: Abnormal   Collection Time: 01/18/16  5:48 PM  Result Value Ref Range   Total Protein 7.6 6.5 - 8.1 g/dL   Albumin 4.4 3.5 - 5.0 g/dL   AST 19 15 - 41 U/L   ALT 13 (L) 17 - 63 U/L   Alkaline Phosphatase 70 38 - 126 U/L   Total Bilirubin 0.5 0.3 - 1.2 mg/dL   Bilirubin, Direct <0.1 (L) 0.1 - 0.5 mg/dL   Indirect Bilirubin NOT CALCULATED 0.3 - 0.9 mg/dL  CK     Status: Abnormal    Collection Time: 01/18/16 11:25 PM  Result Value Ref Range   Total CK 419 (H) 49 - 397 U/L  Lipid panel     Status: Abnormal   Collection Time: 01/19/16  2:20 AM  Result Value Ref Range   Cholesterol 274 (H) 0 - 200 mg/dL   Triglycerides 213 (H) <150 mg/dL   HDL 37 (L) >40 mg/dL   Total CHOL/HDL Ratio 7.4 RATIO   VLDL 43 (H) 0 - 40 mg/dL   LDL Cholesterol 194 (H) 0 - 99 mg/dL    Comment:        Total Cholesterol/HDL:CHD Risk Coronary Heart Disease Risk Table                     Men   Women  1/2 Average Risk   3.4   3.3  Average Risk       5.0   4.4  2 X Average Risk   9.6   7.1  3 X Average Risk  23.4   11.0        Use the calculated Patient Ratio above and the CHD Risk Table to determine the patient's CHD Risk.        ATP III CLASSIFICATION (LDL):  <100     mg/dL   Optimal  100-129  mg/dL   Near or Above                    Optimal  130-159  mg/dL   Borderline  160-189  mg/dL   High  >190     mg/dL   Very High     Ct Head Wo Contrast  01/18/2016  CLINICAL DATA:  Recent seizure activity EXAM: CT HEAD WITHOUT CONTRAST TECHNIQUE: Contiguous axial images were obtained from the base of the skull through the vertex without intravenous contrast. COMPARISON:  05/01/2014 FINDINGS: Somewhat ovoid area of decreased attenuation is noted in the right frontal lobe consistent with an acute infarct. This measures approximately 4.0 by 1.7 cm. No other areas of apparent ischemia are noted. IMPRESSION: Acute ischemia in the right frontal lobe Critical Value/emergent results were called by telephone at the time of interpretation on 01/18/2016 at 4:48 pm to Dr. Joelene Millin, who verbally acknowledged these results. Electronically Signed   By: Inez Catalina M.D.   On: 01/18/2016 16:52   Mr Jeri Cos MG Contrast  01/19/2016  CLINICAL DATA:  Initial evaluation for acute slurred speech for 1 day. EXAM: MRI HEAD WITHOUT AND WITH CONTRAST MRA HEAD WITHOUT CONTRAST TECHNIQUE: Multiplanar, multiecho pulse sequences  of the brain and surrounding structures were obtained without and with intravenous contrast. Angiographic images of the head were obtained using MRA technique without contrast. CONTRAST:  58m MULTIHANCE GADOBENATE DIMEGLUMINE 529 MG/ML IV SOLN COMPARISON:  Prior CT from earlier the same day. FINDINGS: MRI HEAD FINDINGS Cerebral volume within normal limits for patient age. Mild scattered chronic small vessel ischemic disease present within the periventricular and deep white matter both cerebral hemispheres. No evidence for acute infarct. Major intracranial vascular flow voids maintained. No acute or chronic intracranial hemorrhage. No areas of chronic infarction. Fairly well-circumscribed mass centered at the medial aspect of the anterior right frontal lobe is present. Lesion is isointense to gray matter on pre contrast T1 weighted sequence with somewhat hypointense T2 weighted sequence, with fairly homogeneous postcontrast enhancement. The mass measures 3.0 x 2.1 x 2.7 cm (AP by transverse by craniocaudad). Lesion  abuts the falx at its midline, and there is an associated enhancing dural tail. No associated hemorrhage or significant necrosis. Although somewhat difficult discern, this is favored to be extra-axial in location, with a faint CSF cleft visible on coronal T2 weighted sequences (series 11, image 25). Given this, finding favored to reflect a meningioma. There is associated vasogenic edema within the adjacent anterior right frontal lobe which accounted for previously seen signal abnormality on prior CT. Edema does not cross the midline. No significant mass effect on the right lateral ventricle posteriorly. No other mass lesion or abnormal enhancement within the brain. Craniocervical junction normal. No hydrocephalus. No extra-axial fluid collection. Pituitary gland within normal limits. No acute abnormality about the orbits. Mild opacity within the left ethmoidal air cells. Paranasal sinuses are otherwise  clear. Mild scattered opacity within the mastoid air cells bilaterally. Inner ear structures grossly normal. Bone marrow signal intensity within normal limits. No scalp soft tissue abnormality. MRA HEAD FINDINGS ANTERIOR CIRCULATION: Visualized distal cervical segments of the internal carotid arteries are widely patent with antegrade flow. Petrous, cavernous, and supraclinoid ICAs are widely patent. A1 segments, anterior communicating artery, and anterior cerebral arteries well opacified. Right anterior cerebral artery closely approximates the posterior aspect of the lesion. M1 segments patent without stenosis or occlusion. MCA bifurcations normal. Distal MCA branches well opacified. POSTERIOR CIRCULATION: Vertebral arteries patent to the vertebrobasilar junction. Posterior inferior cerebral arteries patent. Basilar artery well opacified to its distal aspect. Superior cerebral arteries and posterior cerebral arteries well opacified bilaterally. No aneurysm or vascular malformation. IMPRESSION: MRI HEAD IMPRESSION: 1. 3.0 x 2.1 x 2.7 cm solidly enhancing mass within the parasagittal right frontal lobe. Although somewhat difficult to discern, this is favored to be extra-axial in location, and most consistent with a meningioma. Associated vasogenic edema within the adjacent right frontal lobe without significant mass effect. 2. Mild chronic microvascular ischemic changes. MRA HEAD IMPRESSION: Normal intracranial MRA. Electronically Signed   By: Jeannine Boga M.D.   On: 01/19/2016 02:51   Mr Jodene Nam Head/brain Wo Cm  01/19/2016  CLINICAL DATA:  Initial evaluation for acute slurred speech for 1 day. EXAM: MRI HEAD WITHOUT AND WITH CONTRAST MRA HEAD WITHOUT CONTRAST TECHNIQUE: Multiplanar, multiecho pulse sequences of the brain and surrounding structures were obtained without and with intravenous contrast. Angiographic images of the head were obtained using MRA technique without contrast. CONTRAST:  12m MULTIHANCE  GADOBENATE DIMEGLUMINE 529 MG/ML IV SOLN COMPARISON:  Prior CT from earlier the same day. FINDINGS: MRI HEAD FINDINGS Cerebral volume within normal limits for patient age. Mild scattered chronic small vessel ischemic disease present within the periventricular and deep white matter both cerebral hemispheres. No evidence for acute infarct. Major intracranial vascular flow voids maintained. No acute or chronic intracranial hemorrhage. No areas of chronic infarction. Fairly well-circumscribed mass centered at the medial aspect of the anterior right frontal lobe is present. Lesion is isointense to gray matter on pre contrast T1 weighted sequence with somewhat hypointense T2 weighted sequence, with fairly homogeneous postcontrast enhancement. The mass measures 3.0 x 2.1 x 2.7 cm (AP by transverse by craniocaudad). Lesion abuts the falx at its midline, and there is an associated enhancing dural tail. No associated hemorrhage or significant necrosis. Although somewhat difficult discern, this is favored to be extra-axial in location, with a faint CSF cleft visible on coronal T2 weighted sequences (series 11, image 25). Given this, finding favored to reflect a meningioma. There is associated vasogenic edema within the adjacent anterior right frontal lobe  which accounted for previously seen signal abnormality on prior CT. Edema does not cross the midline. No significant mass effect on the right lateral ventricle posteriorly. No other mass lesion or abnormal enhancement within the brain. Craniocervical junction normal. No hydrocephalus. No extra-axial fluid collection. Pituitary gland within normal limits. No acute abnormality about the orbits. Mild opacity within the left ethmoidal air cells. Paranasal sinuses are otherwise clear. Mild scattered opacity within the mastoid air cells bilaterally. Inner ear structures grossly normal. Bone marrow signal intensity within normal limits. No scalp soft tissue abnormality. MRA HEAD  FINDINGS ANTERIOR CIRCULATION: Visualized distal cervical segments of the internal carotid arteries are widely patent with antegrade flow. Petrous, cavernous, and supraclinoid ICAs are widely patent. A1 segments, anterior communicating artery, and anterior cerebral arteries well opacified. Right anterior cerebral artery closely approximates the posterior aspect of the lesion. M1 segments patent without stenosis or occlusion. MCA bifurcations normal. Distal MCA branches well opacified. POSTERIOR CIRCULATION: Vertebral arteries patent to the vertebrobasilar junction. Posterior inferior cerebral arteries patent. Basilar artery well opacified to its distal aspect. Superior cerebral arteries and posterior cerebral arteries well opacified bilaterally. No aneurysm or vascular malformation. IMPRESSION: MRI HEAD IMPRESSION: 1. 3.0 x 2.1 x 2.7 cm solidly enhancing mass within the parasagittal right frontal lobe. Although somewhat difficult to discern, this is favored to be extra-axial in location, and most consistent with a meningioma. Associated vasogenic edema within the adjacent right frontal lobe without significant mass effect. 2. Mild chronic microvascular ischemic changes. MRA HEAD IMPRESSION: Normal intracranial MRA. Electronically Signed   By: Jeannine Boga M.D.   On: 01/19/2016 02:51    Review of Systems  Eyes: Negative.   Cardiovascular: Negative.   Gastrointestinal: Negative.   Genitourinary: Negative.   Musculoskeletal: Negative.   Neurological: Positive for headaches.       New onset seizure  Endo/Heme/Allergies:       Several year history of diabetes  Psychiatric/Behavioral: Negative.    Blood pressure 181/66, pulse 73, temperature 98.3 F (36.8 C), temperature source Oral, resp. rate 18, height 6' 3" (1.905 m), weight 108.001 kg (238 lb 1.6 oz), SpO2 98 %. Physical Exam  Constitutional: He is oriented to person, place, and time. He appears well-developed and well-nourished.  HENT:   Head: Normocephalic and atraumatic.  Eyes: Conjunctivae and EOM are normal. Pupils are equal, round, and reactive to light.  Neck: Normal range of motion. Neck supple.  Cardiovascular: Normal rate and regular rhythm.   Respiratory: Effort normal and breath sounds normal.  GI: Soft. Bowel sounds are normal.  Musculoskeletal: Normal range of motion.  Neurological: He is alert and oriented to person, place, and time. He has normal reflexes.  Skin: Skin is warm and dry.  Psychiatric: He has a normal mood and affect. His behavior is normal. Judgment and thought content normal.    Assessment/Plan: Right frontal falcine meningioma measuring 2 x 3 x 3 cm. Surrounding white matter edema.  I discussed the findings with the patient and demonstrated his films to him demonstrating the size and location of the lesion. I discussed the plan for surgical intervention and so far as this lesion appears to be growing and wasn't there and a scan 2 years ago I noted that he is concerned about his work status and had a he is going to sustain himself and a little bit of time to absorb all this information. In that regard I believe it would be reasonable to allow the patient to eat a regular diet  be mobilized any connection to be discharged home and it suggests that the surgery will need to be performed sometime in the near future and we'll try to offer some dates for him in that regard. I do believe he should be maintained on Keppra as an anticonvulsant and I'll suggest some small doses of Decadron such as 2 mg twice a day at the time of discharge.  Champ Keetch J 01/19/2016, 12:42 PM

## 2016-01-19 NOTE — Progress Notes (Signed)
EEG completed, results pending. 

## 2016-01-19 NOTE — Progress Notes (Signed)
Echocardiogram 2D Echocardiogram has been performed.  Jeremy Soto 01/19/2016, 3:29 PM

## 2016-01-19 NOTE — Care Management Note (Addendum)
Case Management Note  Patient Details  Name: Jeremy Soto MRN: [id number] Date of Birth: Jul 21, 1966  Subjective/Objective:                    Action/Plan: Patient was admitted with seizure, found to have a meningioma. Resides in a "halfway house". Patient is currently listed as self-pay. Will follow for discharge needs pending PT/OT evals and physician orders.  Expected Discharge Date:  01/21/16               Expected Discharge Plan:     In-House Referral:     Discharge planning Services     Post Acute Care Choice:    Choice offered to:     DME Arranged:    DME Agency:     HH Arranged:    HH Agency:     Status of Service:  In process, will continue to follow  Medicare Important Message Given:    Date Medicare IM Given:    Medicare IM give by:    Date Additional Medicare IM Given:    Additional Medicare Important Message give by:     If discussed at [place] of Stay Meetings, dates discussed:    Additional CommentsRolm Baptise, RN 01/19/2016, 11:07 AM [phone]

## 2016-01-19 NOTE — Evaluation (Signed)
Occupational Therapy Evaluation Patient Details Name: Jeremy Soto MRN: [id number] DOB: February 06, 1966 Today's Date: 01/19/2016    History of Present Illness Jeremy Soto is a 50 year old right-hand-dominant individual who presented with a new onset of seizure yesterday evening. This was witnessed by his girlfriend lasted approximately 5 minutes and included tonic-clonic movements of the extremities. 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    Clinical Impression   Pt admitted with seizure. Pt currently with functional limitations due to the deficits listed below (see OT Problem List).  Pt will benefit from skilled OT to increase their safety and independence with ADL and functional mobility for ADL to facilitate discharge to venue listed below.         Equipment Recommendations  None recommended by OT       Precautions / Restrictions Precautions Precautions: Other (comment) Precaution Comments: seizure      Mobility Bed Mobility Overal bed mobility: Needs Assistance Bed Mobility: Supine to Sit     Supine to sit: Min guard        Transfers Overall transfer level: Needs assistance Equipment used: Rolling walker (2 wheeled) Transfers: Sit to/from Stand Sit to Stand: Min guard                   ADL Overall ADL's : Needs assistance/impaired Eating/Feeding: Set up;Sitting   Grooming: Set up;Sitting                   Toilet Transfer: RW;Minimal Print production planner Details (indicate cue type and reason): using urinal           General ADL Comments: eval shorteded as pt going for doppler               Pertinent Vitals/Pain Pain Assessment: No/denies pain     Hand Dominance   right   Extremity/Trunk Assessment Upper Extremity Assessment Upper Extremity Assessment: LUE deficits/detail;Generalized weakness LUE Sensation: decreased light touch              Cognition Arousal/Alertness: Awake/alert Behavior During Therapy: WFL for tasks assessed/performed Overall Cognitive Status: Within Functional Limits for tasks assessed                     General Comments   eval limited as pt had to leave for test            Home Living Family/patient expects to be discharged to:: Private residence                                        Prior Functioning/Environment Level of Independence: Independent             OT Diagnosis: Generalized weakness   OT Problem List: Decreased strength;Decreased activity tolerance;Decreased safety awareness;Impaired sensation   OT Treatment/Interventions: Self-care/ADL training;Patient/family education    OT Goals(Current goals can be found in the care plan section) Acute Rehab OT Goals Patient Stated Goal: back to work OT Goal Formulation: With patient Time For Goal Achievement: 01/19/16 Potential to Achieve Goals: Good ADL Goals Pt Will Perform Grooming: with modified independence;standing Pt Will Transfer to Toilet: with  modified independence;regular height toilet Pt Will Perform Toileting - Clothing Manipulation and hygiene: with modified independence;sit to/from stand Pt Will Perform Tub/Shower Transfer: Tub transfer;ambulating Pt/caregiver will Perform Home Exercise Program: Left upper extremity;Increased strength;With written HEP provided  OT Frequency: Min 2X/week              End of Session Equipment Utilized During Treatment: Rolling walker Nurse Communication: Mobility status  Activity Tolerance: Patient tolerated treatment well Patient left: in bed;with call bell/phone within reach;with family/visitor present;with nursing/sitter in room   Time: 1403-1430 OT Time  Calculation (min): 27 min Charges:  OT General Charges $OT Visit: 1 Procedure OT Evaluation $OT Eval Low Complexity: 1 Procedure OT Treatments $Self Care/Home Management : 8-22 mins G-Codes:    Payton Mccallum D 2016/02/08, 3:11 PM
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
## 2016-01-19 NOTE — Progress Notes (Signed)
VASCULAR LAB PRELIMINARY  PRELIMINARY  PRELIMINARY  PRELIMINARY  Carotid duplex completed.    Preliminary report:  1-39% ICA plaquing.  Vertebral artery flow is antegrade.   Jeremy Soto, RVT 01/19/2016, 3:08 PM

## 2016-01-19 NOTE — Progress Notes (Signed)
SLP Cancellation Note  Patient Details Name: Jeremy Soto MRN: [id number] DOB: 1966/01/08   Cancelled treatment:       Reason Eval/Treat Not Completed: Patient at procedure or test/unavailable.  SLP will follow up as able.  Gunnar Fusi, M.A., CCC-SLP [phone]  [place] 01/19/2016, 2:53 PM

## 2016-01-19 NOTE — Progress Notes (Signed)
PT Cancellation Note  Patient Details Name: Jeremy Soto MRN: [id number] DOB: Aug 31, 1966   Cancelled Treatment:    Reason Eval/Treat Not Completed: Patient at procedure or test/unavailable. Patient off floor.   Jeremy Soto 01/19/2016, 10:54 AM  Pager [phone]

## 2016-01-19 NOTE — Progress Notes (Signed)
[facility] PROGRESS NOTE  Jeremy Soto [id number] DOB: Feb 09, 1966 DOA: 01/18/2016 PCP: No PCP Per Patient  HPI: 50 year old male with no significant PMH presented with slurred speech of 1 day duration as well as headache. Reports having a right-sided headache for the past month which worsened on 01/17/16, also when the pt had a witnessed tonic-clonic seizure. He was incontinent of urine and appeared post-ictal afterward, but refused to come to the ED. Noted slurred speech and persistent right-sided headache after episode, which continued to the morning of 01/18/16, causing concern and ultimate EMS activation. CT scan showed acute ischemia of right frontal lobe. MRI showed mass within the parasagittal right frontal lobe most consistent with a meningioma, associated vasogenic edema in adjacent right frontal lobe without significant mass effect. Neurology consulted, given one dose of dexamethasone 4 mg IV, started on keppra. Consulted neurosurgery for prompt resection of meningioma.  Pt requesting we contact his friend, Dr. Levester Fresh for discussion of meningioma resection: [phone].  Subjective: Pt still complaining of a mild, 3/10 headache on the R side.   Assessment/Plan:  Meningioma - MRI brain showed a 3.0 x 2.1 x 2.7 cm solidly enhancing mass within the parasagittal right frontal lobe, although somewhat difficult to discern, this is most consistent with meningioma, there is associated vasogenic edema within the adjacent right frontal lobe. Mild chronic microvascular ischemic changes.  -Seen by neurosurgery, recommended resection. -Patient wants to get a second opinion, per Dr. Ellene Route can be discharged on Keppra and low-dose dexamethasone.  Brain lesion seen on CT, initially thought this to be stroke - CT head showing acute ischemia of right frontal lobe, likely due to meningioma as seen on MRI discussed above - MRA brain did not show stroke but brain mass likely  meningioma.  Seizure  - new onset with tonic-clonic activity witnessed, likely due to the above - neurology consulted and pt started on keppra - EEG pending - urine drug screen positive for cocaine - blood EtOH negative  CKD stage II - creatinine 1.44, baseline is ~1.4-1.5 - urinalysis unremarkable - continue to monitor with BMP  Hyperlipidemia  - cholesterol of 274, triglycerides 213 - start on statin therapy  Cocaine abuse - urine drug screen positive for cocaine - HIV antibody and hepatitis panel pending  Tobacco abuse - NicoDerm patch.   Code Status: Full code Family Communication: Daughter Disposition Plan: Remain inpatient   Consultants:  Neurology  Neurosurgery  Procedures:  None  Antibiotics:  None   Objective: Filed Vitals:   01/19/16 0500 01/19/16 0657  BP: 132/72 140/70  Pulse:    Temp: 98 F (36.7 C) 98.2 F (36.8 C)  Resp: 18 18   No intake or output data in the 24 hours ending 01/19/16 1009 Filed Weights   01/18/16 2300  Weight: 108.001 kg (238 lb 1.6 oz)    Exam:   General:  Alert and oriented, in no acute distress  Cardiovascular: RRR  Respiratory: CTAB  Abdomen: Soft, non-tender  Musculoskeletal: No edema  Neuro: CN II-XII intact, strength 4/5 left upper/lower extremities, 5/5 right upper/lower extremities, decreased sensation to light touch and pinprick of left face, left upper/lower extremity  Data Reviewed: Basic Metabolic Panel:  Recent Labs Lab 01/18/16 1452  NA 139  K 4.0  CL 103  CO2 26  GLUCOSE 102*  BUN 16  CREATININE 1.44*  CALCIUM 9.6   Liver Function Tests:  Recent Labs Lab 01/18/16 1748  AST 19  ALT 13*  ALKPHOS 70  BILITOT 0.5  PROT 7.6  ALBUMIN 4.4   No results for input(s): LIPASE, AMYLASE in the last 168 hours. No results for input(s): AMMONIA in the last 168 hours. CBC:  Recent Labs Lab 01/18/16 1452  WBC 7.0  HGB 13.2  HCT 38.5*  MCV 88.9  PLT 262   Cardiac  Enzymes:  Recent Labs Lab 01/18/16 2325  CKTOTAL 419*   BNP (last 3 results) No results for input(s): BNP in the last 8760 hours.  ProBNP (last 3 results) No results for input(s): PROBNP in the last 8760 hours.  CBG:  Recent Labs Lab 01/18/16 1454  GLUCAP 111*    No results found for this or any previous visit (from the past 240 hour(s)).   Studies: Ct Head Wo Contrast  01/18/2016  CLINICAL DATA:  Recent seizure activity EXAM: CT HEAD WITHOUT CONTRAST TECHNIQUE: Contiguous axial images were obtained from the base of the skull through the vertex without intravenous contrast. COMPARISON:  05/01/2014 FINDINGS: Somewhat ovoid area of decreased attenuation is noted in the right frontal lobe consistent with an acute infarct. This measures approximately 4.0 by 1.7 cm. No other areas of apparent ischemia are noted. IMPRESSION: Acute ischemia in the right frontal lobe Critical Value/emergent results were called by telephone at the time of interpretation on 01/18/2016 at 4:48 pm to Dr. Joelene Millin, who verbally acknowledged these results. Electronically Signed   By: Inez Catalina M.D.   On: 01/18/2016 16:52   Mr Jeri Cos [id number] Contrast  01/19/2016  CLINICAL DATA:  Initial evaluation for acute slurred speech for 1 day. EXAM: MRI HEAD WITHOUT AND WITH CONTRAST MRA HEAD WITHOUT CONTRAST TECHNIQUE: Multiplanar, multiecho pulse sequences of the brain and surrounding structures were obtained without and with intravenous contrast. Angiographic images of the head were obtained using MRA technique without contrast. CONTRAST:  67mL MULTIHANCE GADOBENATE DIMEGLUMINE 529 MG/ML IV SOLN COMPARISON:  Prior CT from earlier the same day. FINDINGS: MRI HEAD FINDINGS Cerebral volume within normal limits for patient age. Mild scattered chronic small vessel ischemic disease present within the periventricular and deep white matter both cerebral hemispheres. No evidence for acute infarct. Major intracranial vascular flow voids  maintained. No acute or chronic intracranial hemorrhage. No areas of chronic infarction. Fairly well-circumscribed mass centered at the medial aspect of the anterior right frontal lobe is present. Lesion is isointense to gray matter on pre contrast T1 weighted sequence with somewhat hypointense T2 weighted sequence, with fairly homogeneous postcontrast enhancement. The mass measures 3.0 x 2.1 x 2.7 cm (AP by transverse by craniocaudad). Lesion abuts the falx at its midline, and there is an associated enhancing dural tail. No associated hemorrhage or significant necrosis. Although somewhat difficult discern, this is favored to be extra-axial in location, with a faint CSF cleft visible on coronal T2 weighted sequences (series 11, image 25). Given this, finding favored to reflect a meningioma. There is associated vasogenic edema within the adjacent anterior right frontal lobe which accounted for previously seen signal abnormality on prior CT. Edema does not cross the midline. No significant mass effect on the right lateral ventricle posteriorly. No other mass lesion or abnormal enhancement within the brain. Craniocervical junction normal. No hydrocephalus. No extra-axial fluid collection. Pituitary gland within normal limits. No acute abnormality about the orbits. Mild opacity within the left ethmoidal air cells. Paranasal sinuses are otherwise clear. Mild scattered opacity within the mastoid air cells bilaterally. Inner ear structures grossly normal. Bone marrow signal intensity within normal limits. No scalp soft tissue abnormality. MRA HEAD FINDINGS  ANTERIOR CIRCULATION: Visualized distal cervical segments of the internal carotid arteries are widely patent with antegrade flow. Petrous, cavernous, and supraclinoid ICAs are widely patent. A1 segments, anterior communicating artery, and anterior cerebral arteries well opacified. Right anterior cerebral artery closely approximates the posterior aspect of the lesion. M1  segments patent without stenosis or occlusion. MCA bifurcations normal. Distal MCA branches well opacified. POSTERIOR CIRCULATION: Vertebral arteries patent to the vertebrobasilar junction. Posterior inferior cerebral arteries patent. Basilar artery well opacified to its distal aspect. Superior cerebral arteries and posterior cerebral arteries well opacified bilaterally. No aneurysm or vascular malformation. IMPRESSION: MRI HEAD IMPRESSION: 1. 3.0 x 2.1 x 2.7 cm solidly enhancing mass within the parasagittal right frontal lobe. Although somewhat difficult to discern, this is favored to be extra-axial in location, and most consistent with a meningioma. Associated vasogenic edema within the adjacent right frontal lobe without significant mass effect. 2. Mild chronic microvascular ischemic changes. MRA HEAD IMPRESSION: Normal intracranial MRA. Electronically Signed   By: Jeannine Boga M.D.   On: 01/19/2016 02:51   Mr Jodene Nam Head/brain Wo Cm  01/19/2016  CLINICAL DATA:  Initial evaluation for acute slurred speech for 1 day. EXAM: MRI HEAD WITHOUT AND WITH CONTRAST MRA HEAD WITHOUT CONTRAST TECHNIQUE: Multiplanar, multiecho pulse sequences of the brain and surrounding structures were obtained without and with intravenous contrast. Angiographic images of the head were obtained using MRA technique without contrast. CONTRAST:  10mL MULTIHANCE GADOBENATE DIMEGLUMINE 529 MG/ML IV SOLN COMPARISON:  Prior CT from earlier the same day. FINDINGS: MRI HEAD FINDINGS Cerebral volume within normal limits for patient age. Mild scattered chronic small vessel ischemic disease present within the periventricular and deep white matter both cerebral hemispheres. No evidence for acute infarct. Major intracranial vascular flow voids maintained. No acute or chronic intracranial hemorrhage. No areas of chronic infarction. Fairly well-circumscribed mass centered at the medial aspect of the anterior right frontal lobe is present. Lesion is  isointense to gray matter on pre contrast T1 weighted sequence with somewhat hypointense T2 weighted sequence, with fairly homogeneous postcontrast enhancement. The mass measures 3.0 x 2.1 x 2.7 cm (AP by transverse by craniocaudad). Lesion abuts the falx at its midline, and there is an associated enhancing dural tail. No associated hemorrhage or significant necrosis. Although somewhat difficult discern, this is favored to be extra-axial in location, with a faint CSF cleft visible on coronal T2 weighted sequences (series 11, image 25). Given this, finding favored to reflect a meningioma. There is associated vasogenic edema within the adjacent anterior right frontal lobe which accounted for previously seen signal abnormality on prior CT. Edema does not cross the midline. No significant mass effect on the right lateral ventricle posteriorly. No other mass lesion or abnormal enhancement within the brain. Craniocervical junction normal. No hydrocephalus. No extra-axial fluid collection. Pituitary gland within normal limits. No acute abnormality about the orbits. Mild opacity within the left ethmoidal air cells. Paranasal sinuses are otherwise clear. Mild scattered opacity within the mastoid air cells bilaterally. Inner ear structures grossly normal. Bone marrow signal intensity within normal limits. No scalp soft tissue abnormality. MRA HEAD FINDINGS ANTERIOR CIRCULATION: Visualized distal cervical segments of the internal carotid arteries are widely patent with antegrade flow. Petrous, cavernous, and supraclinoid ICAs are widely patent. A1 segments, anterior communicating artery, and anterior cerebral arteries well opacified. Right anterior cerebral artery closely approximates the posterior aspect of the lesion. M1 segments patent without stenosis or occlusion. MCA bifurcations normal. Distal MCA branches well opacified. POSTERIOR CIRCULATION: Vertebral arteries  patent to the vertebrobasilar junction. Posterior  inferior cerebral arteries patent. Basilar artery well opacified to its distal aspect. Superior cerebral arteries and posterior cerebral arteries well opacified bilaterally. No aneurysm or vascular malformation. IMPRESSION: MRI HEAD IMPRESSION: 1. 3.0 x 2.1 x 2.7 cm solidly enhancing mass within the parasagittal right frontal lobe. Although somewhat difficult to discern, this is favored to be extra-axial in location, and most consistent with a meningioma. Associated vasogenic edema within the adjacent right frontal lobe without significant mass effect. 2. Mild chronic microvascular ischemic changes. MRA HEAD IMPRESSION: Normal intracranial MRA. Electronically Signed   By: Jeannine Boga M.D.   On: 01/19/2016 02:51    Scheduled Meds: . aspirin  300 mg Rectal Daily   Or  . aspirin  325 mg Oral Daily  . enoxaparin (LOVENOX) injection  40 mg Subcutaneous Q24H  . levETIRAcetam  1,000 mg Intravenous Q12H  . nicotine  14 mg Transdermal Daily   Continuous Infusions:   Active Problems:   Stroke (cerebrum) (HCC)   CKD (chronic kidney disease) stage 2, GFR 60-89 ml/min   Tobacco abuse    Time spent: [place], PA-S [facility] Pager [phone]. If 7PM-7AM, please contact night-coverage at [email or web address], password [facility] 01/19/2016, 10:09 AM  LOS: 1 day    Birdie Hopes Pager: [phone] 01/19/2016, 2:21 PM

## 2016-01-19 NOTE — Consult Note (Addendum)
Neurology Consultation Reason for Consult: seizure and abnormal MRI Referring Physician: ED  CC: seizure  History is obtained from patient  HPI: Jeremy Soto is a 50 y.o. male with hx of Dm and HTM who had a witnessed seizure on the evening of 01/17/2016. This was witnessed by the patient's girlfriend who described tonic-clonic activity at OSH as well as the patient's "right arm being clenched to his chest and drooling from his mouth".  He has no prior hx of seizrues. Has had serere headaches over the last month.  The patient also was incontinent of urine during episode. The episode lasted approximately 5 minutes after which, the patient was confused for approximately 15 minutes. Ct head in Ed showed hypodensity so that pt was transferred here for a workup.  MRI with gad shows a meningioma that is fairly large and with associated edema, considering it was not present on a HCT back in 2015.   ROS: A 14 point ROS was performed and is negative except as noted in the HPI.  Past Medical History  Diagnosis Date  . GERD (gastroesophageal reflux disease)      Family History  Problem Relation Age of Onset  . Diabetes Maternal Grandmother   . Diabetes Paternal Grandfather   . Hypertension Paternal Grandfather   . Hypertension Father   . Hypertension Mother    Social History:  reports that he has been smoking Cigarettes.  He has a 5 pack-year smoking history. He has never used smokeless tobacco. He reports that he drinks alcohol. He reports that he does not use illicit drugs.  Exam: Current vital signs: BP 115/74 mmHg  Pulse 71  Temp(Src) 98 F (36.7 C) (Oral)  Resp 18  Ht 6\' 3"  (1.905 m)  Wt 108.001 kg (238 lb 1.6 oz)  BMI 29.76 kg/m2  SpO2 98% Vital signs in last 24 hours: Temp:  [98 F (36.7 C)-98.4 F (36.9 C)] 98 F (36.7 C) (03/19 2300) Pulse Rate:  [64-98] 71 (03/19 2130) Resp:  [15-21] 18 (03/19 2300) BP: (107-159)/(70-94) 115/74 mmHg (03/19 2300) SpO2:  [97 %-100 %]  98 % (03/19 2300) Weight:  [108.001 kg (238 lb 1.6 oz)] 108.001 kg (238 lb 1.6 oz) (03/19 2300)   Physical Exam  Constitutional: Appears well-developed and well-nourished.  Psych: Affect appropriate to situation Eyes: No scleral injection HENT: No OP obstrucion Head: Normocephalic.  Cardiovascular: Normal rate and regular rhythm.  Respiratory: Effort normal and breath sounds normal to anterior ascultation GI: Soft.  No distension. There is no tenderness.  Skin: WDI  Neuro: Mental Status: Patient is awake, alert, oriented to person, place, month, year, and situation. Patient is able to give a clear and coherent history. No signs of aphasia or neglect Cranial Nerves: II: Visual Fields are full. Pupils are equal, round, and reactive to light. III,IV, VI: EOMI without ptosis or diploplia.  V: Facial sensation is symmetric to temperature VII: Facial movement is symmetric.  VIII: hearing is intact to voice X: Uvula elevates symmetrically XI: Shoulder shrug is symmetric. XII: tongue is midline without atrophy or fasciculations.  Motor: Tone is normal. Bulk is normal. 5/5 strength was present in all four extremities. Sensory: Sensation is symmetric to light touch and temperature in the arms and legs Deep Tendon Reflexes: 2+ and symmetric in the biceps and patellae. Plantars: Toes are downgoing bilaterally Cerebellar: FNF and HKS are intact bilaterally   I have reviewed the images obtained:  MRI and HCT - shows a meningioma of the right  frontal with associated edema  Impression: New rapidly growing meningioma in likely need of prompt reserction.  Neurosurgery consultation in the morning.  Will give one time dose of dexa 4mg  iv.  Continue lovenox for dvt prophylaxis.  Day on call neurohospitalist will follow after this morning.  Seizure  - decrease keppra [id number].  No driving until cleared by outpateint neurologist.    Recommendations: 1) as above

## 2016-01-19 NOTE — Procedures (Signed)
ELECTROENCEPHALOGRAM REPORT  Date of Study: 01/19/2016  Patient's Name: Jeremy Soto MRN: [id number] Date of Birth: 12-27-1965  Referring Provider: Orson Eva, DO  Indication: 50 year old man with right frontal meningioma and witnessed tonic-clonic seizure  Medications: Keppra  Technical Summary: This is a multichannel digital EEG recording, using the international 10-20 placement system with electrodes applied with paste and impedances below 5000 ohms.    Description: The EEG background is symmetric, with a well-developed posterior dominant rhythm of 8 Hz, which is reactive to eye opening and closing.  Diffuse beta activity is seen, with a bilateral frontal preponderance.  No focal or generalized abnormalities are seen.  No focal or generalized epileptiform discharges are seen.  Stage II sleep is seen, with normal and symmetric sleep patterns.  Photic stimulation was performed, and produced no abnormalities.  ECG revealed normal cardiac rate and rhythm.  Impression: This is a normal routine EEG of the awake and asleep states, with activating procedures.  A normal study does not rule out the possibility of a seizure disorder in this patient.  Benecio Kluger R. Tomi Likens, DO

## 2016-01-19 NOTE — Clinical Documentation Improvement (Signed)
Internal Medicine Neurology  Please clarify if the following diagnosis, Vasogenic Edema was:   Present at the time of admission (POA)  NOT present at the time of admission and it developed during the inpatient stay  Unable to clinically determine whether the condition was present on admission.  Unknown   Supporting Information: MRI of the Brain.......finding favored to reflect a meningioma. There is associated vasogenic edema within the adjacent anterior right frontal lobe which accounted for previously seen signal abnormality on prior CT. Edema does not cross the midline"............   Please exercise your independent, professional judgment when responding. A specific answer is not anticipated or expected.   Thank You,  [place] [phone]

## 2016-01-20 DIAGNOSIS — D329 Benign neoplasm of meninges, unspecified: Principal | ICD-10-CM

## 2016-01-20 LAB — HEPATITIS C ANTIBODY: HCV Ab: 0.1 s/co ratio (ref 0.0–0.9)

## 2016-01-20 LAB — HEMOGLOBIN A1C
Hgb A1c MFr Bld: 5.6 % (ref 4.8–5.6)
MEAN PLASMA GLUCOSE: 114 mg/dL

## 2016-01-20 LAB — HEPATITIS B SURFACE ANTIGEN: Hepatitis B Surface Ag: NEGATIVE

## 2016-01-20 MED ORDER — LEVETIRACETAM 500 MG PO TABS: 500.0000 mg | ORAL_TABLET | Freq: Two times a day (BID) | ORAL | Status: DC

## 2016-01-20 MED ORDER — DEXAMETHASONE 2 MG PO TABS: 2.0000 mg | ORAL_TABLET | Freq: Two times a day (BID) | ORAL | Status: DC

## 2016-01-20 MED ORDER — SIMVASTATIN 20 MG PO TABS: 20.0000 mg | ORAL_TABLET | Freq: Every day | ORAL | Status: DC

## 2016-01-20 NOTE — Discharge Summary (Signed)
Physician Discharge Summary  Jeremy Soto [id number] DOB: 05-03-1966 DOA: 01/18/2016  PCP: No PCP Per Patient  Admit date: 01/18/2016 Discharge date: 01/20/2016  Time spent: 40 minutes  Recommendations for Outpatient Follow-up:  1. Follow-up with neurosurgery next Tuesday for meningioma resection   Discharge Diagnoses:  Principal Problem:   Meningioma [facility]) Active Problems:   Stroke (cerebrum) (HCC)   Seizure (HCC)   CKD (chronic kidney disease) stage 2, GFR 60-89 ml/min   Tobacco abuse   Discharge Condition: Stable  Diet recommendation: Heart healthy  Filed Weights   01/18/16 2300  Weight: 108.001 kg (238 lb 1.6 oz)    History of present illness:  50 year old male with no chronic medical problems presented with slurred speech of 1 day duration. The patient had a witnessed seizure on the evening of 01/17/2016. This was witnessed by the patient's current friend who described tonic-clonic activity as well as the patient's right arm being clenched to his chest and drooling from his mouth. The patient also was incontinent of urine during episode. The episode lasted approximately 5 minutes after which, the patient was confused for approximately 15 minutes. EMS was activated at that time, but the patient refused to come to the hospital. After the episode, the patient was noted to have some dysarthria as well as a right-sided headache. When he woke up on the morning of 01/18/2016 his right headache and dysarthria persisted. As a result, the patient activated EMS. Patient denies fevers, chills, headache, chest pain, dyspnea, nausea, vomiting, diarrhea, abdominal pain, dysuria, hematuria In the emergency department, the patient was afebrile and hemodynamically stable with oxygen saturation 100% on room air. BMP was unremarkable except for serum creatinine of 1.44. CBC was essentially unremarkable. CT of the brain showed acute ischemia of the right frontal lobe. Neurology was contacted  from the emergency department and recommended transfer to [facility] for further workup. Neurology also has ordered MRI of the brain with and without gadolinium.  Hospital Course:   Meningioma - Presented with seizure, MRI brain showed a 3.0 x 2.1 x 2.7 cm solidly enhancing mass within the parasagittal right frontal lobe, although somewhat difficult to discern, this is most consistent with meningioma, there is associated vasogenic edema (present on admission)  within the adjacent right frontal lobe. Mild chronic microvascular ischemic changes.  -Seen by neurosurgery, recommended resection. -Scheduled for surgery on next Tuesday. -Discharged per Dr. Ellene Route recommendations on Keppra and dexamethasone.  Brain lesion seen on CT, initially thought this to be stroke - CT head showing acute ischemia of right frontal lobe, likely due to meningioma as seen on MRI discussed above - MRA brain did not show stroke but brain mass likely meningioma.  Seizure  - new onset with tonic-clonic activity witnessed, likely due to the above - Neurology consulted and pt started on keppra, discharge on 500 mg twice a day. - Urine drug screen positive for cocaine - Blood EtOH negative  CKD stage II - creatinine 1.44, baseline is ~1.4-1.5 - urinalysis unremarkable - continue to monitor with BMP  Hyperlipidemia  - cholesterol of 274, triglycerides 213 - start on statin therapy  Cocaine abuse - urine drug screen positive for cocaine - HIV antibody and hepatitis panel pending  Tobacco abuse - NicoDerm patch, counseled for quitting.   Procedures:  None  Consultations:  Neurosurgery  Discharge Exam: Filed Vitals:   01/20/16 0500 01/20/16 1018  BP: 113/80 113/76  Pulse: 85 81  Temp: 98 F (36.7 C) 97.8 F (36.6 C)  Resp: 18 18   General: Alert and awake, oriented x3, not in any acute distress. HEENT: anicteric sclera, pupils reactive to light and accommodation, EOMI CVS: S1-S2 clear, no  murmur rubs or gallops Chest: clear to auscultation bilaterally, no wheezing, rales or rhonchi Abdomen: soft nontender, nondistended, normal bowel sounds, no organomegaly Extremities: no cyanosis, clubbing or edema noted bilaterally Neuro: Cranial nerves II-XII intact, no focal neurological deficits  Discharge Instructions   Discharge Instructions    Diet - low sodium heart healthy    Complete by:  As directed      Increase activity slowly    Complete by:  As directed           Current Discharge Medication List    START taking these medications   Details  dexamethasone (DECADRON) 2 MG tablet Take 1 tablet (2 mg total) by mouth every 12 (twelve) hours. Qty: 60 tablet, Refills: 0    levETIRAcetam (KEPPRA) 500 MG tablet Take 1 tablet (500 mg total) by mouth 2 (two) times daily. Qty: 60 tablet, Refills: 0    simvastatin (ZOCOR) 20 MG tablet Take 1 tablet (20 mg total) by mouth daily at 6 PM. Qty: 30 tablet, Refills: 0      STOP taking these medications     meloxicam (MOBIC) 15 MG tablet        No Known Allergies    The results of significant diagnostics from this hospitalization (including imaging, microbiology, ancillary and laboratory) are listed below for reference.    Significant Diagnostic Studies: Dg Chest 2 View  01/19/2016  CLINICAL DATA:  50 year old male with new onset seizures. EXAM: CHEST  2 VIEW COMPARISON:  05/01/2014 FINDINGS: The cardiomediastinal silhouette is unremarkable. There is no evidence of focal airspace disease, pulmonary edema, suspicious pulmonary nodule/mass, pleural effusion, or pneumothorax. No acute bony abnormalities are identified. IMPRESSION: No active cardiopulmonary disease. Electronically Signed   By: Margarette Canada M.D.   On: 01/19/2016 17:08   Ct Head Wo Contrast  01/18/2016  CLINICAL DATA:  Recent seizure activity EXAM: CT HEAD WITHOUT CONTRAST TECHNIQUE: Contiguous axial images were obtained from the base of the skull through the  vertex without intravenous contrast. COMPARISON:  05/01/2014 FINDINGS: Somewhat ovoid area of decreased attenuation is noted in the right frontal lobe consistent with an acute infarct. This measures approximately 4.0 by 1.7 cm. No other areas of apparent ischemia are noted. IMPRESSION: Acute ischemia in the right frontal lobe Critical Value/emergent results were called by telephone at the time of interpretation on 01/18/2016 at 4:48 pm to Dr. Joelene Millin, who verbally acknowledged these results. Electronically Signed   By: Inez Catalina M.D.   On: 01/18/2016 16:52   Mr Jeri Cos [id number] Contrast  01/19/2016  CLINICAL DATA:  Initial evaluation for acute slurred speech for 1 day. EXAM: MRI HEAD WITHOUT AND WITH CONTRAST MRA HEAD WITHOUT CONTRAST TECHNIQUE: Multiplanar, multiecho pulse sequences of the brain and surrounding structures were obtained without and with intravenous contrast. Angiographic images of the head were obtained using MRA technique without contrast. CONTRAST:  98mL MULTIHANCE GADOBENATE DIMEGLUMINE 529 MG/ML IV SOLN COMPARISON:  Prior CT from earlier the same day. FINDINGS: MRI HEAD FINDINGS Cerebral volume within normal limits for patient age. Mild scattered chronic small vessel ischemic disease present within the periventricular and deep white matter both cerebral hemispheres. No evidence for acute infarct. Major intracranial vascular flow voids maintained. No acute or chronic intracranial hemorrhage. No areas of chronic infarction. Fairly well-circumscribed mass centered at the medial  aspect of the anterior right frontal lobe is present. Lesion is isointense to gray matter on pre contrast T1 weighted sequence with somewhat hypointense T2 weighted sequence, with fairly homogeneous postcontrast enhancement. The mass measures 3.0 x 2.1 x 2.7 cm (AP by transverse by craniocaudad). Lesion abuts the falx at its midline, and there is an associated enhancing dural tail. No associated hemorrhage or significant  necrosis. Although somewhat difficult discern, this is favored to be extra-axial in location, with a faint CSF cleft visible on coronal T2 weighted sequences (series 11, image 25). Given this, finding favored to reflect a meningioma. There is associated vasogenic edema within the adjacent anterior right frontal lobe which accounted for previously seen signal abnormality on prior CT. Edema does not cross the midline. No significant mass effect on the right lateral ventricle posteriorly. No other mass lesion or abnormal enhancement within the brain. Craniocervical junction normal. No hydrocephalus. No extra-axial fluid collection. Pituitary gland within normal limits. No acute abnormality about the orbits. Mild opacity within the left ethmoidal air cells. Paranasal sinuses are otherwise clear. Mild scattered opacity within the mastoid air cells bilaterally. Inner ear structures grossly normal. Bone marrow signal intensity within normal limits. No scalp soft tissue abnormality. MRA HEAD FINDINGS ANTERIOR CIRCULATION: Visualized distal cervical segments of the internal carotid arteries are widely patent with antegrade flow. Petrous, cavernous, and supraclinoid ICAs are widely patent. A1 segments, anterior communicating artery, and anterior cerebral arteries well opacified. Right anterior cerebral artery closely approximates the posterior aspect of the lesion. M1 segments patent without stenosis or occlusion. MCA bifurcations normal. Distal MCA branches well opacified. POSTERIOR CIRCULATION: Vertebral arteries patent to the vertebrobasilar junction. Posterior inferior cerebral arteries patent. Basilar artery well opacified to its distal aspect. Superior cerebral arteries and posterior cerebral arteries well opacified bilaterally. No aneurysm or vascular malformation. IMPRESSION: MRI HEAD IMPRESSION: 1. 3.0 x 2.1 x 2.7 cm solidly enhancing mass within the parasagittal right frontal lobe. Although somewhat difficult to  discern, this is favored to be extra-axial in location, and most consistent with a meningioma. Associated vasogenic edema within the adjacent right frontal lobe without significant mass effect. 2. Mild chronic microvascular ischemic changes. MRA HEAD IMPRESSION: Normal intracranial MRA. Electronically Signed   By: Jeannine Boga M.D.   On: 01/19/2016 02:51   Mr Jodene Nam Head/brain Wo Cm  01/19/2016  CLINICAL DATA:  Initial evaluation for acute slurred speech for 1 day. EXAM: MRI HEAD WITHOUT AND WITH CONTRAST MRA HEAD WITHOUT CONTRAST TECHNIQUE: Multiplanar, multiecho pulse sequences of the brain and surrounding structures were obtained without and with intravenous contrast. Angiographic images of the head were obtained using MRA technique without contrast. CONTRAST:  15mL MULTIHANCE GADOBENATE DIMEGLUMINE 529 MG/ML IV SOLN COMPARISON:  Prior CT from earlier the same day. FINDINGS: MRI HEAD FINDINGS Cerebral volume within normal limits for patient age. Mild scattered chronic small vessel ischemic disease present within the periventricular and deep white matter both cerebral hemispheres. No evidence for acute infarct. Major intracranial vascular flow voids maintained. No acute or chronic intracranial hemorrhage. No areas of chronic infarction. Fairly well-circumscribed mass centered at the medial aspect of the anterior right frontal lobe is present. Lesion is isointense to gray matter on pre contrast T1 weighted sequence with somewhat hypointense T2 weighted sequence, with fairly homogeneous postcontrast enhancement. The mass measures 3.0 x 2.1 x 2.7 cm (AP by transverse by craniocaudad). Lesion abuts the falx at its midline, and there is an associated enhancing dural tail. No associated hemorrhage or significant necrosis.  Although somewhat difficult discern, this is favored to be extra-axial in location, with a faint CSF cleft visible on coronal T2 weighted sequences (series 11, image 25). Given this, finding  favored to reflect a meningioma. There is associated vasogenic edema within the adjacent anterior right frontal lobe which accounted for previously seen signal abnormality on prior CT. Edema does not cross the midline. No significant mass effect on the right lateral ventricle posteriorly. No other mass lesion or abnormal enhancement within the brain. Craniocervical junction normal. No hydrocephalus. No extra-axial fluid collection. Pituitary gland within normal limits. No acute abnormality about the orbits. Mild opacity within the left ethmoidal air cells. Paranasal sinuses are otherwise clear. Mild scattered opacity within the mastoid air cells bilaterally. Inner ear structures grossly normal. Bone marrow signal intensity within normal limits. No scalp soft tissue abnormality. MRA HEAD FINDINGS ANTERIOR CIRCULATION: Visualized distal cervical segments of the internal carotid arteries are widely patent with antegrade flow. Petrous, cavernous, and supraclinoid ICAs are widely patent. A1 segments, anterior communicating artery, and anterior cerebral arteries well opacified. Right anterior cerebral artery closely approximates the posterior aspect of the lesion. M1 segments patent without stenosis or occlusion. MCA bifurcations normal. Distal MCA branches well opacified. POSTERIOR CIRCULATION: Vertebral arteries patent to the vertebrobasilar junction. Posterior inferior cerebral arteries patent. Basilar artery well opacified to its distal aspect. Superior cerebral arteries and posterior cerebral arteries well opacified bilaterally. No aneurysm or vascular malformation. IMPRESSION: MRI HEAD IMPRESSION: 1. 3.0 x 2.1 x 2.7 cm solidly enhancing mass within the parasagittal right frontal lobe. Although somewhat difficult to discern, this is favored to be extra-axial in location, and most consistent with a meningioma. Associated vasogenic edema within the adjacent right frontal lobe without significant mass effect. 2. Mild  chronic microvascular ischemic changes. MRA HEAD IMPRESSION: Normal intracranial MRA. Electronically Signed   By: Jeannine Boga M.D.   On: 01/19/2016 02:51    Microbiology: No results found for this or any previous visit (from the past 240 hour(s)).   Labs: Basic Metabolic Panel:  Recent Labs Lab 01/18/16 1452  NA 139  K 4.0  CL 103  CO2 26  GLUCOSE 102*  BUN 16  CREATININE 1.44*  CALCIUM 9.6   Liver Function Tests:  Recent Labs Lab 01/18/16 1748  AST 19  ALT 13*  ALKPHOS 70  BILITOT 0.5  PROT 7.6  ALBUMIN 4.4   No results for input(s): LIPASE, AMYLASE in the last 168 hours. No results for input(s): AMMONIA in the last 168 hours. CBC:  Recent Labs Lab 01/18/16 1452  WBC 7.0  HGB 13.2  HCT 38.5*  MCV 88.9  PLT 262   Cardiac Enzymes:  Recent Labs Lab 01/18/16 2325  CKTOTAL 419*   BNP: BNP (last 3 results) No results for input(s): BNP in the last 8760 hours.  ProBNP (last 3 results) No results for input(s): PROBNP in the last 8760 hours.  CBG:  Recent Labs Lab 01/18/16 1454  GLUCAP 111*       Signed:  Gonzalo Waymire A MD.  [facility] 01/20/2016, 12:30 PM

## 2016-01-20 NOTE — Evaluation (Signed)
Speech Language Pathology Evaluation Patient Details Name: Jeremy Soto MRN: [id number] DOB: [birth year] Today's Date: 01/20/2016 Time: 1245-1300 SLP Time Calculation (min) (ACUTE ONLY): 15 min  Problem List:  Patient Active Problem List   Diagnosis Date Noted  . Meningioma ([place]) 01/19/2016  . Stroke (cerebrum) ([place]) 01/18/2016  . CKD (chronic kidney disease) stage 2, GFR 60-89 ml/min 01/18/2016  . Tobacco abuse 01/18/2016  . Cerebrovascular accident (CVA) ([place])   . Seizure ([place])   . Peptic ulcer disease 04/13/2013  . GERD (gastroesophageal reflux disease) 04/13/2013  . Abdominal pain, epigastric 04/13/2013   Past Medical History:  Past Medical History  Diagnosis Date  . GERD (gastroesophageal reflux disease)    Past Surgical History:  Past Surgical History  Procedure Laterality Date  . Head injury      hit in head with a chainsaw had staples in head  . Diagnostic laparoscopy      ulcer  . Closed reduction nasal fracture N/A 06/04/2014    Procedure: CLOSED REDUCTION NASAL FRACTURE;  Surgeon: Ascencion Dike, MD;  Location: [place];  Service: ENT;  Laterality: N/A;   HPI:  50 year old male admitted 01/18/16 following seizure and slurred speech. PMH significant for GERD. MRI revealed large meningioma in the parasagittal right frontal lobe. SLE ordered to evaluate com/cog status prior to surgery.   Assessment / Plan / Recommendation Clinical Impression  The [place] Exam was administered. Pt scored 26/30 (n=26/30). Difficulty noted on orientation to season (stated Fall), attention and calculation (unable to spell world backwards or complete serial 7's), and delayed recall of 2/3 words. A score of 26/30 falls within normal limits. Surgery for right meningioma is scheduled for next Tuesday. Recommend reconsult if cognitive deficits noted after surgery.     SLP Assessment  Patient does not need any further Speech Language Pathology Services at this time, but  may benefit from reconsult following brain surgery, if deficits are noted.   Follow Up Recommendations    pending surgery   Frequency and Duration   n/a at this time.        SLP Evaluation Prior Functioning  Cognitive/Linguistic Baseline: Within functional limits Type of Home: House Available Help at Discharge: Family;Friend(s) Education: 3 years college   Cognition  Overall Cognitive Status: Within Functional Limits for tasks assessed Arousal/Alertness: Awake/alert Orientation Level: Oriented X4    Comprehension  Auditory Comprehension Overall Auditory Comprehension: Appears within functional limits for tasks assessed    Expression Expression Primary Mode of Expression: Verbal Verbal Expression Overall Verbal Expression: Appears within functional limits for tasks assessed   Oral / Motor  Oral Motor/Sensory Function Overall Oral Motor/Sensory Function: Within functional limits Motor Speech Overall Motor Speech: Appears within functional limits for tasks assessed   GO                Shonna Chock 01/20/2016, 1:05 PM  [facility], [place] [phone]

## 2016-01-20 NOTE — Progress Notes (Signed)
Pt discharged home with mother. IV removed and telemetry discontinued. Discharge instructions given including illicit drug use cessation. Pt's questions asked and answered. Pt will be residing with mother until surgery with Dr. Ellene Route on 01/27/16. Left unit via wheelchair with NT at 1430. Wendee Copp

## 2016-01-20 NOTE — Care Management Note (Signed)
Case Management Note  Patient Details  Name: Jeremy Soto MRN: [id number] Date of Birth: 30-Nov-1965  Subjective/Objective:                    Action/Plan: CM met with patient to discuss discharge needs. Patient denies having a PCP.  An appointment was made at the [facility] (currently seeing patients for the [facility]), to establish a PCP.  Patient's appointment is 02/03/16 at 2:30pm.  Information was added to the AVS. Patient was provided with written information on the [facility], as he will be able to use their pharmacy for medication assistance.  Patient was provided with a [place] letter to assist with the first 30 day supply of his medications.  No further discharge needs identified at this time.  Expected Discharge Date:  01/21/16               Expected Discharge Plan:     In-House Referral:     Discharge planning Services  CM Consult, Follow-up appt scheduled, [place] [facility]  Post Acute Care Choice:    Choice offered to:     DME Arranged:    DME Agency:     HH Arranged:    [place] Agency:     Status of Service:  Completed, signed off  Medicare Important Message Given:    Date Medicare IM Given:    Medicare IM give by:    Date Additional Medicare IM Given:    Additional Medicare Important Message give by:     If discussed at [place] of Stay Meetings, dates discussed:    Additional CommentsRolm Baptise, RN 01/20/2016, 2:13 PM [phone]

## 2016-01-20 NOTE — Progress Notes (Signed)
Patient ID: Jeremy Soto, male   DOB: 03/13/1966, 50 y.o.   MRN: [id number] Vital signs are stable Patient is feeling quite normal Discuss plans for surgery I have scheduled surgery for this coming Tuesday In the meantime he can be discharged home My office will be in contact with him to finalize arrangements for his surgery I believe that he should be maintained on Keppra 500 mg by mouth twice a day and Decadron 2 mg by mouth twice a day at time of discharge I will admit him to my service when he returns for surgery

## 2016-01-26 ENCOUNTER — Encounter (HOSPITAL_COMMUNITY): Payer: Self-pay | Admitting: *Deleted

## 2016-01-26 ENCOUNTER — Other Ambulatory Visit: Payer: Self-pay | Admitting: Neurological Surgery

## 2016-01-26 NOTE — Progress Notes (Signed)
Jeremy Soto denies chest pain or shortness of breath.  Patient states that he does get headache on the left side of head and he can feel pulses,but it last a short period of time, pain score 4/10 and he doers not do any treatment for it.  Patient also reports feeling lightheaded at times.    Jeremy Soto reports having a "ulcer" to left side of groin area , "it has been there a while but it is sore now and has gotten bigger." "I had a ulcer on my stomach and I had a procedure for itat an office across the  road from [facility]  I found in patient's record that he had an endoscopy in the past and had an ulcer, but nothing externally. I asked patient is it is inflamed like a boil, he said "no".  I called Jeremy Soto at Dr Clarice Pole office and informed her of tall of the above and asked for Dr Ellene Route to sign orders. Jeremy Soto is going to send information to Dr Ellene Route, who is out of the office today.

## 2016-01-27 ENCOUNTER — Inpatient Hospital Stay (HOSPITAL_COMMUNITY)
Admission: RE | Admit: 2016-01-27 | Discharge: 2016-01-30 | DRG: 025 | Disposition: A | Discharge status: Home / Self Care | Payer: Medicaid Other | Source: Ambulatory Visit | Attending: Neurological Surgery | Admitting: Neurological Surgery

## 2016-01-27 ENCOUNTER — Encounter (HOSPITAL_COMMUNITY)
Admission: RE | Disposition: A | Discharge status: Home / Self Care | Payer: Self-pay | Source: Ambulatory Visit | Attending: Neurological Surgery

## 2016-01-27 ENCOUNTER — Inpatient Hospital Stay (HOSPITAL_COMMUNITY): Payer: Medicaid Other | Admitting: Anesthesiology

## 2016-01-27 ENCOUNTER — Encounter (HOSPITAL_COMMUNITY): Payer: Self-pay | Admitting: *Deleted

## 2016-01-27 DIAGNOSIS — Z9889 Other specified postprocedural states: Secondary | ICD-10-CM

## 2016-01-27 DIAGNOSIS — G40409 Other generalized epilepsy and epileptic syndromes, not intractable, without status epilepticus: Secondary | ICD-10-CM | POA: Diagnosis present

## 2016-01-27 DIAGNOSIS — D320 Benign neoplasm of cerebral meninges: Principal | ICD-10-CM | POA: Diagnosis present

## 2016-01-27 DIAGNOSIS — K219 Gastro-esophageal reflux disease without esophagitis: Secondary | ICD-10-CM | POA: Diagnosis present

## 2016-01-27 DIAGNOSIS — G40909 Epilepsy, unspecified, not intractable, without status epilepticus: Secondary | ICD-10-CM | POA: Diagnosis present

## 2016-01-27 DIAGNOSIS — G936 Cerebral edema: Secondary | ICD-10-CM | POA: Diagnosis present

## 2016-01-27 DIAGNOSIS — F17210 Nicotine dependence, cigarettes, uncomplicated: Secondary | ICD-10-CM | POA: Diagnosis present

## 2016-01-27 DIAGNOSIS — F329 Major depressive disorder, single episode, unspecified: Secondary | ICD-10-CM | POA: Diagnosis present

## 2016-01-27 DIAGNOSIS — F419 Anxiety disorder, unspecified: Secondary | ICD-10-CM | POA: Diagnosis present

## 2016-01-27 DIAGNOSIS — Z79899 Other long term (current) drug therapy: Secondary | ICD-10-CM

## 2016-01-27 DIAGNOSIS — Z8711 Personal history of peptic ulcer disease: Secondary | ICD-10-CM

## 2016-01-27 DIAGNOSIS — D329 Benign neoplasm of meninges, unspecified: Secondary | ICD-10-CM | POA: Diagnosis present

## 2016-01-27 HISTORY — DX: Constipation, unspecified: K59.00

## 2016-01-27 HISTORY — DX: Depression, unspecified: F32.A

## 2016-01-27 HISTORY — DX: Unspecified injury of head, initial encounter: S09.90XA

## 2016-01-27 HISTORY — DX: Anxiety disorder, unspecified: F41.9

## 2016-01-27 HISTORY — DX: Unspecified asthma, uncomplicated: J45.909

## 2016-01-27 HISTORY — DX: Reserved for inherently not codable concepts without codable children: IMO0001

## 2016-01-27 HISTORY — PX: CRANIOTOMY: SHX93

## 2016-01-27 HISTORY — DX: Peptic ulcer, site unspecified, unspecified as acute or chronic, without hemorrhage or perforation: K27.9

## 2016-01-27 HISTORY — DX: Major depressive disorder, single episode, unspecified: F32.9

## 2016-01-27 LAB — CBC
HEMATOCRIT: 33.3 % — AB (ref 39.0–52.0)
Hemoglobin: 11.5 g/dL — ABNORMAL LOW (ref 13.0–17.0)
MCH: 30.1 pg (ref 26.0–34.0)
MCHC: 34.5 g/dL (ref 30.0–36.0)
MCV: 87.2 fL (ref 78.0–100.0)
PLATELETS: 209 10*3/uL (ref 150–400)
RBC: 3.82 MIL/uL — ABNORMAL LOW (ref 4.22–5.81)
RDW: 12.8 % (ref 11.5–15.5)
WBC: 6.8 10*3/uL (ref 4.0–10.5)

## 2016-01-27 LAB — BASIC METABOLIC PANEL
Anion gap: 9 (ref 5–15)
BUN: 13 mg/dL (ref 6–20)
CO2: 26 mmol/L (ref 22–32)
Calcium: 9.8 mg/dL (ref 8.9–10.3)
Chloride: 104 mmol/L (ref 101–111)
Creatinine, Ser: 1.48 mg/dL — ABNORMAL HIGH (ref 0.61–1.24)
GFR calc Af Amer: >60 mL/min (ref >60)
GFR calc non Af Amer: 54 mL/min — ABNORMAL LOW (ref >60)
Glucose, Bld: 95 mg/dL (ref 65–99)
Potassium: 3.8 mmol/L (ref 3.5–5.1)
Sodium: 139 mmol/L (ref 135–145)

## 2016-01-27 LAB — ABO/RH: ABO/RH(D): O POS

## 2016-01-27 LAB — TYPE AND SCREEN
ABO/RH(D): O POS
Antibody Screen: NEGATIVE

## 2016-01-27 LAB — MRSA PCR SCREENING: MRSA BY PCR: NEGATIVE

## 2016-01-27 SURGERY — CRANIOTOMY TUMOR EXCISION
Anesthesia: General | Site: Head | Laterality: Right

## 2016-01-27 MED ORDER — LEVETIRACETAM 500 MG PO TABS: 500.0000 mg | ORAL_TABLET | Freq: Two times a day (BID) | ORAL | Status: DC

## 2016-01-27 MED ORDER — LIDOCAINE HCL (CARDIAC) 20 MG/ML IV SOLN
INTRAVENOUS | Status: DC | PRN
  Administered 2016-01-27: 100 mg via INTRAVENOUS

## 2016-01-27 MED ORDER — MICROFIBRILLAR COLL HEMOSTAT EX PADS
MEDICATED_PAD | CUTANEOUS | Status: DC | PRN
  Administered 2016-01-27: 1 via TOPICAL

## 2016-01-27 MED ORDER — DOCUSATE SODIUM 100 MG PO CAPS
100.0000 mg | ORAL_CAPSULE | Freq: Two times a day (BID) | ORAL | Status: DC
  Administered 2016-01-27 – 2016-01-30 (×6): 100 mg via ORAL
  Filled 2016-01-27 (×6): qty 1

## 2016-01-27 MED ORDER — ONDANSETRON HCL 4 MG/2ML IJ SOLN: 4.0000 mg | INTRAMUSCULAR | Status: DC | PRN

## 2016-01-27 MED ORDER — PHENYLEPHRINE HCL 10 MG/ML IJ SOLN: 10.0000 mg | INTRAVENOUS | Status: DC | PRN

## 2016-01-27 MED ORDER — PANTOPRAZOLE SODIUM 40 MG IV SOLR
40.0000 mg | Freq: Every day | INTRAVENOUS | Status: DC
  Administered 2016-01-27 – 2016-01-28 (×2): 40 mg via INTRAVENOUS
  Filled 2016-01-27 (×2): qty 40

## 2016-01-27 MED ORDER — BACITRACIN ZINC 500 UNIT/GM EX OINT
TOPICAL_OINTMENT | CUTANEOUS | Status: DC | PRN
  Administered 2016-01-27: 1 via TOPICAL

## 2016-01-27 MED ORDER — SODIUM CHLORIDE 0.9 % IR SOLN
Status: DC | PRN
  Administered 2016-01-27: 500 mL

## 2016-01-27 MED ORDER — LEVETIRACETAM 500 MG/5ML IV SOLN
500.0000 mg | Freq: Two times a day (BID) | INTRAVENOUS | Status: DC
  Administered 2016-01-27: 500 mg via INTRAVENOUS
  Filled 2016-01-27 (×3): qty 5

## 2016-01-27 MED ORDER — SENNA 8.6 MG PO TABS
1.0000 | ORAL_TABLET | Freq: Two times a day (BID) | ORAL | Status: DC
  Administered 2016-01-27 – 2016-01-30 (×6): 8.6 mg via ORAL
  Filled 2016-01-27 (×6): qty 1

## 2016-01-27 MED ORDER — ROCURONIUM BROMIDE 100 MG/10ML IV SOLN
INTRAVENOUS | Status: DC | PRN
  Administered 2016-01-27 (×2): 20 mg via INTRAVENOUS
  Administered 2016-01-27: 50 mg via INTRAVENOUS

## 2016-01-27 MED ORDER — LABETALOL HCL 5 MG/ML IV SOLN
INTRAVENOUS | Status: DC | PRN
  Administered 2016-01-27 (×3): 5 mg via INTRAVENOUS
  Administered 2016-01-27: 10 mg via INTRAVENOUS

## 2016-01-27 MED ORDER — SIMVASTATIN 20 MG PO TABS
20.0000 mg | ORAL_TABLET | Freq: Every day | ORAL | Status: DC
  Administered 2016-01-28 – 2016-01-29 (×2): 20 mg via ORAL
  Filled 2016-01-27 (×2): qty 1

## 2016-01-27 MED ORDER — THROMBIN 20000 UNITS EX SOLR
CUTANEOUS | Status: DC | PRN
  Administered 2016-01-27: 20 mL via TOPICAL

## 2016-01-27 MED ORDER — MORPHINE SULFATE (PF) 2 MG/ML IV SOLN
1.0000 mg | INTRAVENOUS | Status: DC | PRN
  Administered 2016-01-28 – 2016-01-29 (×5): 2 mg via INTRAVENOUS
  Filled 2016-01-27 (×5): qty 1

## 2016-01-27 MED ORDER — PROPOFOL 10 MG/ML IV BOLUS
INTRAVENOUS | Status: DC | PRN
  Administered 2016-01-27: 50 mg via INTRAVENOUS
  Administered 2016-01-27: 250 mg via INTRAVENOUS
  Administered 2016-01-27: 100 mg via INTRAVENOUS
  Administered 2016-01-27: 50 mg via INTRAVENOUS

## 2016-01-27 MED ORDER — CEFAZOLIN SODIUM-DEXTROSE 2-4 GM/100ML-% IV SOLN
2.0000 g | Freq: Three times a day (TID) | INTRAVENOUS | Status: AC
  Administered 2016-01-28 (×2): 2 g via INTRAVENOUS
  Filled 2016-01-27 (×2): qty 100

## 2016-01-27 MED ORDER — ROCURONIUM BROMIDE 50 MG/5ML IV SOLN
INTRAVENOUS | Status: AC
Start: 2016-01-27 — End: 2016-01-27
  Filled 2016-01-27: qty 1

## 2016-01-27 MED ORDER — DEXAMETHASONE 2 MG PO TABS
2.0000 mg | ORAL_TABLET | Freq: Two times a day (BID) | ORAL | Status: DC
  Administered 2016-01-27 – 2016-01-30 (×6): 2 mg via ORAL
  Filled 2016-01-27 (×6): qty 1

## 2016-01-27 MED ORDER — LIDOCAINE-EPINEPHRINE 1 %-1:100000 IJ SOLN
INTRAMUSCULAR | Status: DC | PRN
  Administered 2016-01-27: 10 mL

## 2016-01-27 MED ORDER — DEXAMETHASONE SODIUM PHOSPHATE 4 MG/ML IJ SOLN
INTRAMUSCULAR | Status: DC | PRN
  Administered 2016-01-27: 10 mg via INTRAVENOUS

## 2016-01-27 MED ORDER — ONDANSETRON HCL 4 MG/2ML IJ SOLN
INTRAMUSCULAR | Status: DC | PRN
  Administered 2016-01-27: 4 mg via INTRAVENOUS

## 2016-01-27 MED ORDER — HYDROMORPHONE HCL 1 MG/ML IJ SOLN: 0.2500 mg | INTRAMUSCULAR | Status: DC | PRN

## 2016-01-27 MED ORDER — DEXAMETHASONE SODIUM PHOSPHATE 10 MG/ML IJ SOLN
INTRAMUSCULAR | Status: AC
Start: 2016-01-27 — End: 2016-01-27
  Filled 2016-01-27: qty 1

## 2016-01-27 MED ORDER — SUGAMMADEX SODIUM 200 MG/2ML IV SOLN
INTRAVENOUS | Status: AC
  Filled 2016-01-27: qty 2

## 2016-01-27 MED ORDER — FENTANYL CITRATE (PF) 250 MCG/5ML IJ SOLN
INTRAMUSCULAR | Status: AC
  Filled 2016-01-27: qty 5

## 2016-01-27 MED ORDER — ONDANSETRON HCL 4 MG/2ML IJ SOLN
INTRAMUSCULAR | Status: AC
  Filled 2016-01-27: qty 2

## 2016-01-27 MED ORDER — LIDOCAINE HCL (CARDIAC) 20 MG/ML IV SOLN
INTRAVENOUS | Status: AC
  Filled 2016-01-27: qty 5

## 2016-01-27 MED ORDER — POLYETHYLENE GLYCOL 3350 17 G PO PACK: 17.0000 g | PACK | Freq: Every day | ORAL | Status: DC | PRN

## 2016-01-27 MED ORDER — BISACODYL 10 MG RE SUPP: 10.0000 mg | Freq: Every day | RECTAL | Status: DC | PRN

## 2016-01-27 MED ORDER — PROMETHAZINE HCL 25 MG PO TABS: 12.5000 mg | ORAL_TABLET | ORAL | Status: DC | PRN

## 2016-01-27 MED ORDER — MIDAZOLAM HCL 5 MG/5ML IJ SOLN
INTRAMUSCULAR | Status: DC | PRN
  Administered 2016-01-27: 2 mg via INTRAVENOUS

## 2016-01-27 MED ORDER — FENTANYL CITRATE (PF) 100 MCG/2ML IJ SOLN
INTRAMUSCULAR | Status: DC | PRN
  Administered 2016-01-27 (×2): 50 ug via INTRAVENOUS
  Administered 2016-01-27: 150 ug via INTRAVENOUS
  Administered 2016-01-27 (×2): 100 ug via INTRAVENOUS
  Administered 2016-01-27 (×4): 50 ug via INTRAVENOUS
  Administered 2016-01-27: 100 ug via INTRAVENOUS

## 2016-01-27 MED ORDER — ONDANSETRON HCL 4 MG PO TABS: 4.0000 mg | ORAL_TABLET | ORAL | Status: DC | PRN

## 2016-01-27 MED ORDER — ACETAMINOPHEN 325 MG PO TABS: 650.0000 mg | ORAL_TABLET | ORAL | Status: DC | PRN

## 2016-01-27 MED ORDER — PROPOFOL 10 MG/ML IV BOLUS
INTRAVENOUS | Status: AC
  Filled 2016-01-27: qty 20

## 2016-01-27 MED ORDER — SODIUM CHLORIDE 0.9 % IV SOLN
INTRAVENOUS | Status: DC | PRN
  Administered 2016-01-27 (×3): via INTRAVENOUS

## 2016-01-27 MED ORDER — LABETALOL HCL 5 MG/ML IV SOLN: 10.0000 mg | INTRAVENOUS | Status: DC | PRN

## 2016-01-27 MED ORDER — MIDAZOLAM HCL 2 MG/2ML IJ SOLN
INTRAMUSCULAR | Status: AC
Start: 2016-01-27 — End: 2016-01-27
  Filled 2016-01-27: qty 2

## 2016-01-27 MED ORDER — HYDROCODONE-ACETAMINOPHEN 5-325 MG PO TABS
1.0000 | ORAL_TABLET | ORAL | Status: DC | PRN
  Administered 2016-01-27 – 2016-01-30 (×8): 1 via ORAL
  Filled 2016-01-27 (×9): qty 1

## 2016-01-27 MED ORDER — BUPIVACAINE HCL (PF) 0.5 % IJ SOLN
INTRAMUSCULAR | Status: DC | PRN
  Administered 2016-01-27: 10 mL

## 2016-01-27 MED ORDER — PHENYLEPHRINE HCL 10 MG/ML IJ SOLN
INTRAMUSCULAR | Status: DC | PRN
Start: 2016-01-27 — End: 2016-01-27
  Administered 2016-01-27 (×2): 80 ug via INTRAVENOUS

## 2016-01-27 MED ORDER — CEFAZOLIN SODIUM 1 G IJ SOLR
INTRAMUSCULAR | Status: DC | PRN
  Administered 2016-01-27: 2 g via INTRAMUSCULAR

## 2016-01-27 MED ORDER — NALOXONE HCL 0.4 MG/ML IJ SOLN: 0.0800 mg | INTRAMUSCULAR | Status: DC | PRN

## 2016-01-27 MED ORDER — SUCCINYLCHOLINE CHLORIDE 20 MG/ML IJ SOLN
INTRAMUSCULAR | Status: AC
  Filled 2016-01-27: qty 1

## 2016-01-27 MED ORDER — 0.9 % SODIUM CHLORIDE (POUR BTL) OPTIME
TOPICAL | Status: DC | PRN
  Administered 2016-01-27 (×2): 1000 mL

## 2016-01-27 MED ORDER — FLEET ENEMA 7-19 GM/118ML RE ENEM: 1.0000 | ENEMA | Freq: Once | RECTAL | Status: DC | PRN

## 2016-01-27 MED ORDER — PHENYLEPHRINE HCL 10 MG/ML IJ SOLN
10.0000 mg | INTRAMUSCULAR | Status: DC | PRN
  Administered 2016-01-27: 20 ug/min via INTRAVENOUS

## 2016-01-27 MED ORDER — SUGAMMADEX SODIUM 500 MG/5ML IV SOLN
INTRAVENOUS | Status: AC
  Filled 2016-01-27: qty 5

## 2016-01-27 MED ORDER — PHENYLEPHRINE HCL 10 MG/ML IJ SOLN
INTRAMUSCULAR | Status: AC
Start: 2016-01-27 — End: 2016-01-27
  Filled 2016-01-27: qty 1

## 2016-01-27 MED ORDER — SUGAMMADEX SODIUM 500 MG/5ML IV SOLN
INTRAVENOUS | Status: DC | PRN
  Administered 2016-01-27: 216 mg via INTRAVENOUS

## 2016-01-27 MED ORDER — PHENYLEPHRINE 40 MCG/ML (10ML) SYRINGE FOR IV PUSH (FOR BLOOD PRESSURE SUPPORT)
PREFILLED_SYRINGE | INTRAVENOUS | Status: AC
Start: 2016-01-27 — End: 2016-01-27
  Filled 2016-01-27: qty 10

## 2016-01-27 MED ORDER — SODIUM CHLORIDE 0.9 % IV SOLN
INTRAVENOUS | Status: DC
  Administered 2016-01-27 – 2016-01-28 (×2): via INTRAVENOUS

## 2016-01-27 MED ORDER — ACETAMINOPHEN 650 MG RE SUPP: 650.0000 mg | RECTAL | Status: DC | PRN

## 2016-01-27 MED ORDER — LACTATED RINGERS IV SOLN
INTRAVENOUS | Status: DC
  Administered 2016-01-27: 12:00:00 via INTRAVENOUS

## 2016-01-27 MED ORDER — PROPOFOL 10 MG/ML IV BOLUS
INTRAVENOUS | Status: AC
Start: 2016-01-27 — End: 2016-01-27
  Filled 2016-01-27: qty 20

## 2016-01-27 MED ORDER — THROMBIN 5000 UNITS EX SOLR
CUTANEOUS | Status: DC | PRN
  Administered 2016-01-27 (×2): via TOPICAL

## 2016-01-27 MED ORDER — ROCURONIUM BROMIDE 50 MG/5ML IV SOLN
INTRAVENOUS | Status: AC
  Filled 2016-01-27: qty 1

## 2016-01-27 SURGICAL SUPPLY — 92 items
BAG DECANTER FOR FLEXI CONT (MISCELLANEOUS) ×2 IMPLANT
BIT DRILL WIRE PASS 1.3MM (BIT) IMPLANT
BLADE CLIPPER SURG (BLADE) ×2 IMPLANT
BNDG GAUZE ELAST 4 BULKY (GAUZE/BANDAGES/DRESSINGS) ×4 IMPLANT
BRUSH SCRUB EZ 1% IODOPHOR (MISCELLANEOUS) IMPLANT
BRUSH SCRUB EZ PLAIN DRY (MISCELLANEOUS) IMPLANT
BUR ACORN 6.0 PRECISION (BURR) ×2 IMPLANT
BUR SPIRAL ROUTER 2.3 (BUR) ×2 IMPLANT
CANISTER SUCT 3000ML PPV (MISCELLANEOUS) ×4 IMPLANT
CLIP TI MEDIUM 6 (CLIP) IMPLANT
CONT SPEC 4OZ CLIKSEAL STRL BL (MISCELLANEOUS) ×2 IMPLANT
CORDS BIPOLAR (ELECTRODE) ×2 IMPLANT
COTTONBALL LRG STERILE PKG (GAUZE/BANDAGES/DRESSINGS) IMPLANT
COVER BACK TABLE 60X90IN (DRAPES) IMPLANT
DECANTER SPIKE VIAL GLASS SM (MISCELLANEOUS) ×2 IMPLANT
DRAIN CHANNEL 10M FLAT 3/4 FLT (DRAIN) IMPLANT
DRAIN SUBARACHNOID (WOUND CARE) IMPLANT
DRAPE LONG LASER MIC (DRAPES) IMPLANT
DRAPE MICROSCOPE LEICA (MISCELLANEOUS) IMPLANT
DRAPE SURG IRRIG POUCH 19X23 (DRAPES) IMPLANT
DRAPE WARM FLUID 44X44 (DRAPE) ×2 IMPLANT
DRILL WIRE PASS 1.3MM (BIT)
DRSG ADAPTIC 3X8 NADH LF (GAUZE/BANDAGES/DRESSINGS) IMPLANT
DURAMATRIX ONLAY 2X2 (Neuro Prosthesis/Implant) ×2 IMPLANT
DURAPREP 6ML APPLICATOR 50/CS (WOUND CARE) ×2 IMPLANT
ELECT CAUTERY BLADE 6.4 (BLADE) ×2 IMPLANT
ELECT REM PT RETURN 9FT ADLT (ELECTROSURGICAL) ×2
ELECTRODE REM PT RTRN 9FT ADLT (ELECTROSURGICAL) ×1 IMPLANT
EVACUATOR 1/8 PVC DRAIN (DRAIN) IMPLANT
EVACUATOR SILICONE 100CC (DRAIN) IMPLANT
GAUZE SPONGE 4X4 12PLY STRL (GAUZE/BANDAGES/DRESSINGS) ×2 IMPLANT
GAUZE SPONGE 4X4 16PLY XRAY LF (GAUZE/BANDAGES/DRESSINGS) IMPLANT
GLOVE BIO SURGEON STRL SZ 6.5 (GLOVE) ×4 IMPLANT
GLOVE BIO SURGEON STRL SZ7.5 (GLOVE) IMPLANT
GLOVE BIOGEL PI IND STRL 6.5 (GLOVE) ×1 IMPLANT
GLOVE BIOGEL PI IND STRL 7.0 (GLOVE) ×2 IMPLANT
GLOVE BIOGEL PI IND STRL 7.5 (GLOVE) ×1 IMPLANT
GLOVE BIOGEL PI IND STRL 8.5 (GLOVE) ×2 IMPLANT
GLOVE BIOGEL PI INDICATOR 6.5 (GLOVE) ×1
GLOVE BIOGEL PI INDICATOR 7.0 (GLOVE) ×2
GLOVE BIOGEL PI INDICATOR 7.5 (GLOVE) ×1
GLOVE BIOGEL PI INDICATOR 8.5 (GLOVE) ×2
GLOVE ECLIPSE 8.5 STRL (GLOVE) ×4 IMPLANT
GLOVE EXAM NITRILE LRG STRL (GLOVE) IMPLANT
GLOVE EXAM NITRILE MD LF STRL (GLOVE) IMPLANT
GLOVE EXAM NITRILE XL STR (GLOVE) IMPLANT
GLOVE EXAM NITRILE XS STR PU (GLOVE) IMPLANT
GLOVE SS BIOGEL STRL SZ 7 (GLOVE) ×1 IMPLANT
GLOVE SUPERSENSE BIOGEL SZ 7 (GLOVE) ×1
GLOVE SURG SS PI 6.5 STRL IVOR (GLOVE) ×4 IMPLANT
GOWN STRL REUS W/ TWL LRG LVL3 (GOWN DISPOSABLE) ×3 IMPLANT
GOWN STRL REUS W/ TWL XL LVL3 (GOWN DISPOSABLE) ×1 IMPLANT
GOWN STRL REUS W/TWL 2XL LVL3 (GOWN DISPOSABLE) ×2 IMPLANT
GOWN STRL REUS W/TWL LRG LVL3 (GOWN DISPOSABLE) ×3
GOWN STRL REUS W/TWL XL LVL3 (GOWN DISPOSABLE) ×1
HEMOSTAT POWDER KIT SURGIFOAM (HEMOSTASIS) ×4 IMPLANT
HEMOSTAT SURGICEL 2X14 (HEMOSTASIS) ×2 IMPLANT
KIT BASIN OR (CUSTOM PROCEDURE TRAY) ×2 IMPLANT
KIT ROOM TURNOVER OR (KITS) ×2 IMPLANT
NEEDLE HYPO 22GX1.5 SAFETY (NEEDLE) ×2 IMPLANT
NS IRRIG 1000ML POUR BTL (IV SOLUTION) ×4 IMPLANT
PACK CRANIOTOMY (CUSTOM PROCEDURE TRAY) ×2 IMPLANT
PAD ARMBOARD 7.5X6 YLW CONV (MISCELLANEOUS) ×6 IMPLANT
PATTIES SURGICAL .25X.25 (GAUZE/BANDAGES/DRESSINGS) IMPLANT
PATTIES SURGICAL .5 X.5 (GAUZE/BANDAGES/DRESSINGS) IMPLANT
PATTIES SURGICAL .5 X1 (DISPOSABLE) ×2 IMPLANT
PATTIES SURGICAL .5 X3 (DISPOSABLE) IMPLANT
PATTIES SURGICAL 1/4 X 3 (GAUZE/BANDAGES/DRESSINGS) IMPLANT
PATTIES SURGICAL 1X1 (DISPOSABLE) IMPLANT
PATTIES SURGICAL 3 X3 (GAUZE/BANDAGES/DRESSINGS)
PATTIES SURGICAL 3X3 (GAUZE/BANDAGES/DRESSINGS) IMPLANT
PIN MAYFIELD SKULL DISP (PIN) ×2 IMPLANT
PLATE 1.5  2HOLE LNG NEURO (Plate) ×2 IMPLANT
PLATE 1.5 2HOLE LNG NEURO (Plate) ×2 IMPLANT
PLATE 1.5/0.5 18.5MM BURR HOLE (Plate) ×2 IMPLANT
SCREW SELF DRILL HT 1.5/4MM (Screw) ×16 IMPLANT
SPONGE NEURO XRAY DETECT 1X3 (DISPOSABLE) IMPLANT
SPONGE SURGIFOAM ABS GEL 100 (HEMOSTASIS) ×2 IMPLANT
STAPLER SKIN PROX WIDE 3.9 (STAPLE) ×2 IMPLANT
SUT ETHILON 3 0 FSL (SUTURE) IMPLANT
SUT NURALON 4 0 TR CR/8 (SUTURE) ×6 IMPLANT
SUT VIC AB 2-0 CP2 18 (SUTURE) ×8 IMPLANT
SYR CONTROL 10ML LL (SYRINGE) IMPLANT
TAPE SURG TRANSPORE 1 IN (GAUZE/BANDAGES/DRESSINGS) ×1 IMPLANT
TAPE SURGICAL TRANSPORE 1 IN (GAUZE/BANDAGES/DRESSINGS) ×1
TIP SONASTAR STD MISONIX 1.9 (TRAY / TRAY PROCEDURE) IMPLANT
TOWEL OR 17X24 6PK STRL BLUE (TOWEL DISPOSABLE) ×2 IMPLANT
TOWEL OR 17X26 10 PK STRL BLUE (TOWEL DISPOSABLE) ×2 IMPLANT
TRAY FOLEY CATH 16FRSI W/METER (SET/KITS/TRAYS/PACK) ×2 IMPLANT
TRAY FOLEY W/METER SILVER 14FR (SET/KITS/TRAYS/PACK) IMPLANT
UNDERPAD 30X30 INCONTINENT (UNDERPADS AND DIAPERS) IMPLANT
WATER STERILE IRR 1000ML POUR (IV SOLUTION) ×2 IMPLANT

## 2016-01-27 NOTE — H&P (Signed)
Jeremy Soto is an 50 y.o. male.   Chief Complaint: New onset seizures a week ago with a right frontal mass HPI: Patient is a 50 year old right-handed individual who tells me that he's had no significant complaints but presented to the emergency room after he had a seizure a week ago this was a generalized tonic-clonic seizure. A CT and MRI demonstrated the presence of a right frontal mass consistent with a meningioma. There was significant edema in the surrounding white matter around this mass measures 3 x 4 x 5 cm and is attached to the falx. He is advised regarding the need to resect this lesion. He is admitted now for the surgery. Since being started on Keppra is not had any further seizure activity. He feels normal and has no complaints. The lesion was not present on the scan from a few years ago, and because of its rapid growth he's been advised regarding the surgery  Past Medical History  Diagnosis Date  . GERD (gastroesophageal reflux disease)   . Head injury due to trauma     dropped chain saw on head- had staples  . Shortness of breath dyspnea     with exertion   . Anxiety   . Depression   . Constipation   . Peptic ulcer   . Asthma     as a child    Past Surgical History  Procedure Laterality Date  . Diagnostic laparoscopy      - stomach  . Closed reduction nasal fracture N/A 06/04/2014    Procedure: CLOSED REDUCTION NASAL FRACTURE;  Surgeon: Ascencion Dike, MD;  Location: [facility];  Service: ENT;  Laterality: N/A;  . Esophagogastroduodenoscopy endoscopy      Family History  Problem Relation Age of Onset  . Diabetes Maternal Grandmother   . Diabetes Paternal Grandfather   . Hypertension Paternal Grandfather   . Hypertension Father   . Hypertension Mother    Social History:  reports that he has been smoking Cigarettes.  He has a 10 pack-year smoking history. He has never used smokeless tobacco. He reports that he drinks alcohol. He reports that he does not  use illicit drugs.  Allergies: No Known Allergies  Medications Prior to Admission  Medication Sig Dispense Refill  . dexamethasone (DECADRON) 2 MG tablet Take 1 tablet (2 mg total) by mouth every 12 (twelve) hours. 60 tablet 0  . levETIRAcetam (KEPPRA) 500 MG tablet Take 1 tablet (500 mg total) by mouth 2 (two) times daily. 60 tablet 0  . simvastatin (ZOCOR) 20 MG tablet Take 1 tablet (20 mg total) by mouth daily at 6 PM. 30 tablet 0    Results for orders placed or performed during the hospital encounter of 01/27/16 (from the past 48 hour(s))  Type and screen All Cardiac and thoracic surgeries, spinal fusions, myomectomies, craniotomies, colon & liver resections, total joint revisions, same day c-section with placenta previa or accreta.     Status: None   Collection Time: 01/27/16 11:41 AM  Result Value Ref Range   ABO/RH(D) O POS    Antibody Screen NEG    Sample Expiration 01/30/2016   ABO/Rh     Status: None   Collection Time: 01/27/16 11:41 AM  Result Value Ref Range   ABO/RH(D) O POS   Basic metabolic panel     Status: Abnormal   Collection Time: 01/27/16 11:59 AM  Result Value Ref Range   Sodium 139 135 - 145 mmol/L   Potassium 3.8  3.5 - 5.1 mmol/L   Chloride 104 101 - 111 mmol/L   CO2 26 22 - 32 mmol/L   Glucose, Bld 95 65 - 99 mg/dL   BUN 13 6 - 20 mg/dL   Creatinine, Ser 1.48 (H) 0.61 - 1.24 mg/dL   Calcium 9.8 8.9 - 10.3 mg/dL   GFR calc non Af Amer 54 (L) >60 mL/min   GFR calc Af Amer >60 >60 mL/min    Comment: (NOTE) The eGFR has been calculated using the CKD EPI equation. This calculation has not been validated in all clinical situations. eGFR's persistently <60 mL/min signify possible Chronic Kidney Disease.    Anion gap 9 5 - 15  CBC     Status: Abnormal   Collection Time: 01/27/16 11:59 AM  Result Value Ref Range   WBC 6.8 4.0 - 10.5 K/uL   RBC 3.82 (L) 4.22 - 5.81 MIL/uL   Hemoglobin 11.5 (L) 13.0 - 17.0 g/dL   HCT 33.3 (L) 39.0 - 52.0 %   MCV 87.2  78.0 - 100.0 fL   MCH 30.1 26.0 - 34.0 pg   MCHC 34.5 30.0 - 36.0 g/dL   RDW 12.8 11.5 - 15.5 %   Platelets 209 150 - 400 K/uL   No results found.  Review of Systems  Constitutional: Negative.   HENT: Negative.   Eyes: Negative.   Respiratory: Negative.   Gastrointestinal: Negative.   Musculoskeletal: Negative.   Skin: Negative.   Neurological:       New diagnosis of seizure  Endo/Heme/Allergies: Negative.   Psychiatric/Behavioral: Negative.     Blood pressure 127/84, pulse 71, temperature 98.5 F (36.9 C), temperature source Oral, resp. rate 18, height '6\' 3"'$  (1.905 m), weight 107.984 kg (238 lb 1 oz), SpO2 100 %. Physical Exam  Constitutional: He is oriented to person, place, and time. He appears well-developed and well-nourished.  HENT:  Head: Normocephalic and atraumatic.  Eyes: Conjunctivae and EOM are normal. Pupils are equal, round, and reactive to light.  Neck: Normal range of motion. Neck supple.  Cardiovascular: Normal rate and regular rhythm.   Respiratory: Effort normal and breath sounds normal.  GI: Soft. Bowel sounds are normal.  Musculoskeletal: Normal range of motion.  Neurological: He is alert and oriented to person, place, and time. He has normal reflexes.  No neurologic abnormalities are noted station and gait are intact  Skin: Skin is warm and dry.  Psychiatric: He has a normal mood and affect. His behavior is normal. Judgment and thought content normal.     Assessment/Plan Right frontal meningioma. Seizure disorder. Right frontal craniotomy for evacuation of meningioma.  Earleen Newport, MD 01/27/2016, 3:20 PM

## 2016-01-27 NOTE — Anesthesia Preprocedure Evaluation (Signed)
Anesthesia Evaluation  Patient identified by MRN, date of birth, ID band Patient awake    Reviewed: Allergy & Precautions, NPO status , Patient's Chart, lab work & pertinent test results  Airway Mallampati: II  TM Distance: >3 FB Neck ROM: Full    Dental no notable dental hx.    Pulmonary neg pulmonary ROS, Current Smoker,    Pulmonary exam normal breath sounds clear to auscultation       Cardiovascular negative cardio ROS Normal cardiovascular exam Rhythm:Regular Rate:Normal     Neuro/Psych Seizures -,  Anxiety    GI/Hepatic Neg liver ROS, GERD  ,  Endo/Other  negative endocrine ROS  Renal/GU negative Renal ROS  negative genitourinary   Musculoskeletal negative musculoskeletal ROS (+)   Abdominal   Peds negative pediatric ROS (+)  Hematology negative hematology ROS (+)   Anesthesia Other Findings   Reproductive/Obstetrics negative OB ROS                             Anesthesia Physical Anesthesia Plan  ASA: III  Anesthesia Plan: General   Post-op Pain Management:    Induction: Intravenous  Airway Management Planned: Oral ETT  Additional Equipment: Arterial line  Intra-op Plan:   Post-operative Plan: Extubation in OR  Informed Consent: I have reviewed the patients History and Physical, chart, labs and discussed the procedure including the risks, benefits and alternatives for the proposed anesthesia with the patient or authorized representative who has indicated his/her understanding and acceptance.   Dental advisory given  Plan Discussed with: CRNA and Surgeon  Anesthesia Plan Comments:         Anesthesia Quick Evaluation

## 2016-01-27 NOTE — Transfer of Care (Signed)
Immediate Anesthesia Transfer of Care Note  Patient: Jeremy Soto  Procedure(s) Performed: Procedure(s) with comments: RIGHT FRONTAL CRANIOTOMY FOR MENINGIOMA (Right) - RIGHT FRONTAL CRANIOTOMY FOR MENINGIOMA  Patient Location: PACU  Anesthesia Type:General  Level of Consciousness: alert , oriented, sedated and patient cooperative  Airway & Oxygen Therapy: Patient Spontanous Breathing and Patient connected to nasal cannula oxygen  Post-op Assessment: Report given to RN, Post -op Vital signs reviewed and stable and Patient moving all extremities X 4  Post vital signs: Reviewed and stable  Last Vitals:  Filed Vitals:   01/27/16 1127  BP: 127/84  Pulse: 71  Temp: 36.9 C  Resp: 18    Complications: No apparent anesthesia complications

## 2016-01-27 NOTE — Op Note (Signed)
Date of surgery: 01/27/2016 Preoperative diagnosis: Right frontal meningioma Postoperative diagnosis: Right frontal meningioma Procedure: Right frontal craniotomy and gross total removal of meningioma Surgeon: Kristeen Miss First Asst.: Cyndy Freeze M.D. Anesthesia: Gen. endotracheal Indications: Jeremy Soto is a 50 year old individual who has had a singular seizure a week ago and MRI demonstrated a presence of a right frontal meningioma. No such tumor was present on the scan 2 years ago is advised regarding the need for surgery as there was areas of peripheral edema around the tumor. He is now taken to the operating room.  Procedure: The patient was brought to the operating room supine on a stretcher. He was placed on the operating table in the supine position and after the smooth induction of general endotracheal anesthesia his head was placed in the 3 pin headrest in the supine position with the head flexed. The scalp had already been shaved and it was cleansed with alcohol. Then the midline was drawn and the area above the glabella also was palpated. A bicoronal scalp incision was drawn. The scalp was prepped with DuraPrep and draped in a sterile fashion. The incision was opened along the appropriately chosen line. Dissection was carried down through the galea. The pericranium was then stripped. Temporalis fascia and muscle was allowed to be intact. This allowed the bifrontal flap to be folded down low over the brow. Care was taken to palpate the area of the sinuses. Then based in both hemispheres of frontal flap was raised greater on the right side than on left side. The underlying dura was identified. When the bone flap was raised it was noted that the frontal sinuses were entered very slightly at the superior apex. The bone flap was laid aside for later replacement. The sinuses appear to have the mucosa intact over the sinuses. Carefully then the dura was examined. Tack up sutures were placed  around the perimetry of the dural opening. The dura was then opened on the right side and a small cottonoid patty was slipped under the initial dural opening and this was used to dissect away the surrounding brain and allow the dura to be open. The meningioma was readily identified on the right side. As it was identified the flap of the dura was then brought towards the midline just ahead of the frontal edge of the tumor. Posteriorly a similar incision was created however there was more of a gap. The surface of the brain was inspected and the meningioma readily separated from the underlying dura in this region. The surface of the tumor was then cauterized carefully and the plane between the pia of the brain and the tumor itself was dissected carefully using this technique I worked around the perimetry of the tumor separating it from the surrounding pia. The medial aspect of the tumor was a plain between most of the dura except for one area where there is a pedicle of attachment to the falx. Ultimately the entire tumor was surrounded and gradually elevated such that it was only attached to the stock. The stalk was then cauterized and divided and the tumor was lifted out of the surgical bed. He was sent for specimen and a single piece. The dura was then cauterized on the area of the falx attachment and no tongues of tumor were identified on that singular attachment at this point was decided not to resect a fall self having cauterized attachment to the falx. With this the area was inspected and hemostasis was achieved in several peel areas  and then the dura was closed carefully with interrupted 4-0 Nurolon suture. The openings into the frontal sinus were then packed with a piece of dura form in the bone flap was replaced and tacked tightly against the sinus patch of dura form. A couple of dog bone plates and a singular burr hole cover was used to affect disclosure. With this the galea was reflected and closed with 2-0  Vicryl interrupted fashion surgical staples were used in the scalp. Blood loss for the procedure was estimated at 200 mL. A dry sterile dressing was applied to the incision and the patient was removed from the 3 pin headrest and then ultimately awakened and returned to the recovery room.

## 2016-01-27 NOTE — Anesthesia Procedure Notes (Addendum)
Procedure Name: Intubation Date/Time: 01/27/2016 3:54 PM Performed by: Lance Coon Pre-anesthesia Checklist: Patient identified, Timeout performed, Emergency Drugs available, Suction available and Patient being monitored Patient Re-evaluated:Patient Re-evaluated prior to inductionOxygen Delivery Method: Circle system utilized Preoxygenation: Pre-oxygenation with 100% oxygen Intubation Type: IV induction Ventilation: Mask ventilation without difficulty and Oral airway inserted - appropriate to patient size Laryngoscope Size: Sabra Heck and 2 Grade View: Grade II Tube type: Oral Tube size: 7.5 mm Number of attempts: 1 Airway Equipment and Method: Stylet Placement Confirmation: ETT inserted through vocal cords under direct vision,  breath sounds checked- equal and bilateral and positive ETCO2 Secured at: 22 cm Tube secured with: Tape Dental Injury: Teeth and Oropharynx as per pre-operative assessment

## 2016-01-28 ENCOUNTER — Encounter (HOSPITAL_COMMUNITY): Payer: Self-pay | Admitting: Neurological Surgery

## 2016-01-28 LAB — CBC
HEMATOCRIT: 33 % — AB (ref 39.0–52.0)
HEMOGLOBIN: 11.3 g/dL — AB (ref 13.0–17.0)
MCH: 29.8 pg (ref 26.0–34.0)
MCHC: 34.2 g/dL (ref 30.0–36.0)
MCV: 87.1 fL (ref 78.0–100.0)
Platelets: 209 10*3/uL (ref 150–400)
RBC: 3.79 MIL/uL — AB (ref 4.22–5.81)
RDW: 12.6 % (ref 11.5–15.5)
WBC: 10.3 10*3/uL (ref 4.0–10.5)

## 2016-01-28 LAB — BASIC METABOLIC PANEL
Anion gap: 10 (ref 5–15)
BUN: 13 mg/dL (ref 6–20)
CHLORIDE: 104 mmol/L (ref 101–111)
CO2: 25 mmol/L (ref 22–32)
Calcium: 9.1 mg/dL (ref 8.9–10.3)
Creatinine, Ser: 1.5 mg/dL — ABNORMAL HIGH (ref 0.61–1.24)
GFR calc Af Amer: >60 mL/min (ref >60)
GFR calc non Af Amer: 53 mL/min — ABNORMAL LOW (ref >60)
Glucose, Bld: 143 mg/dL — ABNORMAL HIGH (ref 65–99)
POTASSIUM: 4.2 mmol/L (ref 3.5–5.1)
SODIUM: 139 mmol/L (ref 135–145)

## 2016-01-28 MED ORDER — PNEUMOCOCCAL VAC POLYVALENT 25 MCG/0.5ML IJ INJ: 0.5000 mL | INJECTION | INTRAMUSCULAR | Status: DC

## 2016-01-28 MED ORDER — LEVETIRACETAM 500 MG PO TABS
500.0000 mg | ORAL_TABLET | Freq: Two times a day (BID) | ORAL | Status: DC
  Administered 2016-01-28 – 2016-01-30 (×5): 500 mg via ORAL
  Filled 2016-01-28 (×5): qty 1

## 2016-01-28 MED ORDER — NICOTINE 14 MG/24HR TD PT24
14.0000 mg | MEDICATED_PATCH | Freq: Every day | TRANSDERMAL | Status: DC
  Administered 2016-01-28 – 2016-01-30 (×3): 14 mg via TRANSDERMAL
  Filled 2016-01-28 (×3): qty 1

## 2016-01-28 NOTE — Progress Notes (Signed)
Patient ID: Jeremy Soto, male   DOB: [birth year], 50 y.o.   MRN: [id number] Vital signs are stable Motor function is intact No evidence of a drift Dressing is dry swelling is minimal Foley catheter is out We'll transfer patient to the floor

## 2016-01-28 NOTE — Care Management Note (Signed)
Case Management Note  Patient Details  Name: Jeremy Soto MRN: [id number] Date of Birth: 03/17/1966  Subjective/Objective: Pt admitted on 01/27/16 s/p craniotomy for meningioma resection.  PTA, pt independent, lives alone.                   Action/Plan: Met with pt to discuss discharge plans.  Pt states his mother and daughter will assist him at dc.  Daughter plans to take a week off work to stay with him.    Expected Discharge Date:                  Expected Discharge Plan:   Home/Self care  In-House Referral:     Discharge planning Services   CM referral  Post Acute Care Choice:    Choice offered to:     DME Arranged:    DME Agency:     HH Arranged:    HH Agency:     Status of Service:   In process, will continue to follow  Medicare Important Message Given:    Date Medicare IM Given:    Medicare IM give by:    Date Additional Medicare IM Given:    Additional Medicare Important Message give by:     If discussed at [place] of Stay Meetings, dates discussed:    Additional Comments:  Reinaldo Raddle, RN, BSN  Trauma/Neuro ICU Case Manager [phone]

## 2016-01-29 MED ORDER — PANTOPRAZOLE SODIUM 40 MG PO TBEC
40.0000 mg | DELAYED_RELEASE_TABLET | Freq: Every day | ORAL | Status: DC
  Administered 2016-01-29: 40 mg via ORAL
  Filled 2016-01-29: qty 1

## 2016-01-29 MED ORDER — PNEUMOCOCCAL VAC POLYVALENT 25 MCG/0.5ML IJ INJ
0.5000 mL | INJECTION | INTRAMUSCULAR | Status: AC
  Administered 2016-01-30: 0.5 mL via INTRAMUSCULAR
  Filled 2016-01-29: qty 0.5

## 2016-01-29 NOTE — Progress Notes (Signed)
Patient wish to have a BM, RN offered suppository. Pt request oral med and colace already administered. Rn encourage ambulation and lots of fluids at this time. Will continue to monitor.  Ave Filter, RN

## 2016-01-29 NOTE — Progress Notes (Signed)
   01/29/16 1029  Pressure Ulcer Prevention  Repositioned Sitting  Positioning Frequency Able to turn self  Mobility  Activity Ambulate in hall;Ambulate in room;Chair  Level of Assistance Independent  Assistive Device None  Distance Ambulated (ft) 200 ft  Ambulation Response Tolerated well  Bed Position Chair  Range of Motion Active;All extremities   Patient ambulated in hall with standby assist at this time. He denied any pain or discomfort. Will continue to monitor.  Ave Filter, RN

## 2016-01-30 MED ORDER — LEVETIRACETAM 500 MG PO TABS: 500.0000 mg | ORAL_TABLET | Freq: Two times a day (BID) | ORAL | Status: DC

## 2016-01-30 MED ORDER — NICOTINE 14 MG/24HR TD PT24: 14.0000 mg | MEDICATED_PATCH | Freq: Every day | TRANSDERMAL | Status: DC

## 2016-01-30 MED ORDER — DEXAMETHASONE 1 MG PO TABS: ORAL_TABLET | ORAL | Status: DC

## 2016-01-30 MED ORDER — HYDROCODONE-ACETAMINOPHEN 5-325 MG PO TABS: 1.0000 | ORAL_TABLET | ORAL | Status: DC | PRN

## 2016-01-30 MED FILL — DEXAMETHASONE 2 MG TABLET: 2 | 6 days supply | Qty: 8 | Fill #0

## 2016-01-30 MED FILL — ?LEVETIRACETAM 500 MG TABLE: 500 | 30 days supply | Qty: 60 | Fill #0

## 2016-01-30 NOTE — Progress Notes (Signed)
Pt discharged home with family. Removed IV. Discharge instructions given. Questions asked and answered. Pt aware of follow up appt with Dr. Ellene Route on Monday AM. Left unit via wheelchair with staff member. Wendee Copp

## 2016-01-30 NOTE — Discharge Summary (Signed)
Physician Discharge Summary  Patient ID: Jeremy Soto MRN: [id number] DOB/AGE: 11/27/1965 50 y.o.  Admit date: 01/27/2016 Discharge date: 01/30/2016  Admission Diagnoses:Right frontal meningioma, seizure disorder Discharge Diagnoses: Right frontal meningioma, seizure disorder Active Problems:   S/P craniotomy   Benign meningioma [facility])   Discharged Condition: good  Hospital Course: Patient tolerated surgery well  Consults: None  Significant Diagnostic Studies: surgical pathology, benign appearing meningothelial meningioma  Treatments: surgery: right frontal craniotomy for resection of tumor.  Discharge Exam: Blood pressure 118/69, pulse 78, temperature 98.5 F (36.9 C), temperature source Oral, resp. rate 18, height 6\' 3"  (1.905 m), weight 107.984 kg (238 lb 1 oz), SpO2 98 %. normal station and gait. incision clean and dry.  Disposition: 01-Home or Self Care  Discharge Instructions    Call MD for:  redness, tenderness, or signs of infection (pain, swelling, redness, odor or green/yellow discharge around incision site)    Complete by:  As directed      Call MD for:  severe uncontrolled pain    Complete by:  As directed      Call MD for:  temperature >100.4    Complete by:  As directed      Diet - low sodium heart healthy    Complete by:  As directed      Discharge instructions    Complete by:  As directed   No driving, return to office [facility]  atr 11 am Monday February 02, 2016 for staple removal.     Increase activity slowly    Complete by:  As directed             Medication List    TAKE these medications        dexamethasone 2 MG tablet  Commonly known as:  DECADRON  Take 1 tablet (2 mg total) by mouth every 12 (twelve) hours.     dexamethasone 1 MG tablet  Commonly known as:  DECADRON  2 tablets twice daily for 2 days, one tablet twice daily for 2 days, one tablet daily for 2 days.     HYDROcodone-acetaminophen 5-325 MG tablet  Commonly known as:   NORCO/VICODIN  Take 1-2 tablets by mouth every 4 (four) hours as needed for moderate pain.     levETIRAcetam 500 MG tablet  Commonly known as:  KEPPRA  Take 1 tablet (500 mg total) by mouth 2 (two) times daily.     levETIRAcetam 500 MG tablet  Commonly known as:  KEPPRA  Take 1 tablet (500 mg total) by mouth 2 (two) times daily.     nicotine 14 mg/24hr patch  Commonly known as:  NICODERM CQ  Place 1 patch (14 mg total) onto the skin daily.     simvastatin 20 MG tablet  Commonly known as:  ZOCOR  Take 1 tablet (20 mg total) by mouth daily at 6 PM.           Follow-up Information    Follow up with [facility].   [id number]. please arrive 15 min early and bring a picture ID and your current meds.    Contact information:   [place] [phone]       Signed: Earleen Newport 01/30/2016, 2:49 PM

## 2016-01-30 NOTE — Care Management Note (Signed)
Case Management Note  Patient Details  Name: Jeremy Soto MRN: [id number] Date of Birth: Mar 18, 1966  Subjective/Objective:                    Action/Plan: Patient discharging home today. Pt already set up with the sickle cell clinic (overflow for [facility]). CM reminded the patient to use the [facility] pharmacy to assist with his meds.   Expected Discharge Date:                  Expected Discharge Plan:     In-House Referral:     Discharge planning Services     Post Acute Care Choice:    Choice offered to:     DME Arranged:    DME Agency:     HH Arranged:    [place] Agency:     Status of Service:     Medicare Important Message Given:    Date Medicare IM Given:    Medicare IM give by:    Date Additional Medicare IM Given:    Additional Medicare Important Message give by:     If discussed at [place] of Stay Meetings, dates discussed:    Additional Comments:  Pollie Friar, RN 01/30/2016, 4:13 PM

## 2016-02-03 ENCOUNTER — Ambulatory Visit: Payer: Self-pay | Admitting: Family Medicine

## 2016-02-10 NOTE — Anesthesia Postprocedure Evaluation (Signed)
Anesthesia Post Note  Patient: Jeremy Soto  Procedure(s) Performed: Procedure(s) (LRB): RIGHT FRONTAL CRANIOTOMY FOR MENINGIOMA (Right)  Patient location during evaluation: PACU Anesthesia Type: General Level of consciousness: awake, awake and alert and oriented Pain management: pain level controlled Vital Signs Assessment: post-procedure vital signs reviewed and stable Respiratory status: spontaneous breathing, nonlabored ventilation and respiratory function stable Cardiovascular status: blood pressure returned to baseline Anesthetic complications: no    Last Vitals:  Filed Vitals:   01/30/16 0649 01/30/16 1038  BP: 130/77 118/69  Pulse: 63 78  Temp: 36.8 C 36.9 C  Resp: 20 18    Last Pain:  Filed Vitals:   01/30/16 1251  PainSc: 0-No pain                 Thang Flett COKER

## 2016-02-15 NOTE — Anesthesia Postprocedure Evaluation (Signed)
Anesthesia Post Note  Patient: Jeremy Soto  Procedure(s) Performed: Procedure(s) (LRB): RIGHT FRONTAL CRANIOTOMY FOR MENINGIOMA (Right)  Patient location during evaluation: PACU Anesthesia Type: General Level of consciousness: awake and awake and alert Pain management: pain level controlled Vital Signs Assessment: post-procedure vital signs reviewed and stable Respiratory status: spontaneous breathing, respiratory function stable, nonlabored ventilation and patient connected to nasal cannula oxygen Cardiovascular status: blood pressure returned to baseline Anesthetic complications: no    Last Vitals:  Filed Vitals:   01/30/16 0649 01/30/16 1038  BP: 130/77 118/69  Pulse: 63 78  Temp: 36.8 C 36.9 C  Resp: 20 18    Last Pain:  Filed Vitals:   01/30/16 1251  PainSc: 0-No pain                 Antero Derosia COKER

## 2016-02-15 NOTE — Addendum Note (Signed)
Addendum  created 02/15/16 2037 by Roberts Gaudy, MD   Modules edited: Notes Section   Notes Section:  File: [id number]

## 2016-07-15 ENCOUNTER — Inpatient Hospital Stay: Payer: Medicaid Other | Admitting: Internal Medicine

## 2016-08-20 ENCOUNTER — Ambulatory Visit: Payer: Medicaid Other | Admitting: Family Medicine

## 2016-11-17 ENCOUNTER — Emergency Department (HOSPITAL_COMMUNITY)
Admission: EM | Admit: 2016-11-17 | Discharge: 2016-11-17 | Disposition: A | Discharge status: Home / Self Care | Payer: Medicaid Other | Attending: Emergency Medicine | Admitting: Emergency Medicine

## 2016-11-17 ENCOUNTER — Encounter (HOSPITAL_COMMUNITY): Payer: Self-pay | Admitting: Emergency Medicine

## 2016-11-17 DIAGNOSIS — Z86011 Personal history of benign neoplasm of the brain: Secondary | ICD-10-CM | POA: Insufficient documentation

## 2016-11-17 DIAGNOSIS — R569 Unspecified convulsions: Secondary | ICD-10-CM

## 2016-11-17 DIAGNOSIS — F17210 Nicotine dependence, cigarettes, uncomplicated: Secondary | ICD-10-CM | POA: Diagnosis not present

## 2016-11-17 DIAGNOSIS — Z8673 Personal history of transient ischemic attack (TIA), and cerebral infarction without residual deficits: Secondary | ICD-10-CM | POA: Diagnosis not present

## 2016-11-17 DIAGNOSIS — N182 Chronic kidney disease, stage 2 (mild): Secondary | ICD-10-CM | POA: Insufficient documentation

## 2016-11-17 DIAGNOSIS — G40909 Epilepsy, unspecified, not intractable, without status epilepticus: Secondary | ICD-10-CM | POA: Diagnosis not present

## 2016-11-17 DIAGNOSIS — J45909 Unspecified asthma, uncomplicated: Secondary | ICD-10-CM | POA: Diagnosis not present

## 2016-11-17 DIAGNOSIS — Z86018 Personal history of other benign neoplasm: Secondary | ICD-10-CM

## 2016-11-17 HISTORY — DX: Neoplasm of unspecified behavior of brain: D49.6

## 2016-11-17 LAB — CBC WITH DIFFERENTIAL/PLATELET
BASOS PCT: 0 %
Basophils Absolute: 0 10*3/uL (ref 0.0–0.1)
EOS ABS: 0 10*3/uL (ref 0.0–0.7)
Eosinophils Relative: 0 %
HCT: 36.5 % — ABNORMAL LOW (ref 39.0–52.0)
HEMOGLOBIN: 13 g/dL (ref 13.0–17.0)
Lymphocytes Relative: 37 %
Lymphs Abs: 2.5 10*3/uL (ref 0.7–4.0)
MCH: 31.8 pg (ref 26.0–34.0)
MCHC: 35.6 g/dL (ref 30.0–36.0)
MCV: 89.2 fL (ref 78.0–100.0)
MONOS PCT: 7 %
Monocytes Absolute: 0.4 10*3/uL (ref 0.1–1.0)
NEUTROS PCT: 56 %
Neutro Abs: 3.8 10*3/uL (ref 1.7–7.7)
Platelets: 221 10*3/uL (ref 150–400)
RBC: 4.09 MIL/uL — ABNORMAL LOW (ref 4.22–5.81)
RDW: 12.6 % (ref 11.5–15.5)
WBC: 6.8 10*3/uL (ref 4.0–10.5)

## 2016-11-17 LAB — BASIC METABOLIC PANEL
Anion gap: 11 (ref 5–15)
BUN: 10 mg/dL (ref 6–20)
CALCIUM: 9.4 mg/dL (ref 8.9–10.3)
CO2: 23 mmol/L (ref 22–32)
CREATININE: 1.3 mg/dL — AB (ref 0.61–1.24)
Chloride: 105 mmol/L (ref 101–111)
GFR calc non Af Amer: >60 mL/min (ref >60)
Glucose, Bld: 90 mg/dL (ref 65–99)
Potassium: 3.2 mmol/L — ABNORMAL LOW (ref 3.5–5.1)
Sodium: 139 mmol/L (ref 135–145)

## 2016-11-17 MED ORDER — LEVETIRACETAM 500 MG/5ML IV SOLN
1000.0000 mg | Freq: Once | INTRAVENOUS | Status: AC
  Administered 2016-11-17: 1000 mg via INTRAVENOUS
  Filled 2016-11-17: qty 10

## 2016-11-17 MED ORDER — LEVETIRACETAM 500 MG PO TABS: 500.0000 mg | ORAL_TABLET | Freq: Two times a day (BID) | ORAL | 0 refills | Status: DC

## 2016-11-17 NOTE — ED Notes (Signed)
Pt complains of a frontal headache for one month, but decided to say something about it today, he also states that he's had three seizures this week, Wednesday, Friday and yesterday.

## 2016-11-17 NOTE — ED Provider Notes (Addendum)
[facility] DEPT Provider Note   CSN: [id number] Arrival date & time: 11/17/16  0304  By signing my name below, I, Jeremy Soto, attest that this documentation has been prepared under the direction and in the presence of Veryl Speak, MD. Electronically Signed: Oleh Soto, Scribe. 11/17/16. 3:57 AM.   History   Chief Complaint Chief Complaint  Patient presents with  . Headache  . Seizures    HPI Jeremy Soto is a 51 y.o. male who underwent excision of a R frontal benign appearing meningioma in 12/2015 presents to the ED for evaluation of seizures in the setting of prescription loss. This patient states that he is prescribed Decadron and Keppra for management of his seizure disorder. In the last month he has experienced increased frequency of his seizures at times "5 seizures in a week". When asked to describe his seizures, he states "I lose train of thought. My speech slurs and my jaw clenches up. However I don't lose consciousness."  The patient states that he has been intermittently non-compliant on his prescriptions; he is also running out of his prescriptions and does not have follow-up with neurosurgery until tomorrow. At interview, he is reporting a headache with photophobia that is typical for him. He denies any other complaints at this time.   The history is provided by the patient. No language interpreter was used.    Past Medical History:  Diagnosis Date  . Anxiety   . Asthma    as a child  . Brain tumor ([place])   . Constipation   . Depression   . GERD (gastroesophageal reflux disease)   . Head injury due to trauma    dropped chain saw on head- had staples  . Peptic ulcer   . Shortness of breath dyspnea    with exertion   . Sickle cell trait [facility])     Patient Active Problem List   Diagnosis Date Noted  . S/P craniotomy 01/27/2016  . Benign meningioma ([place]) 01/27/2016  . Meningioma ([place]) 01/19/2016  . 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 CKD  (chronic kidney disease) stage 2, GFR 60-89 ml/min 01/18/2016  . Tobacco abuse 01/18/2016  . Cerebrovascular accident (CVA) ([place])   . Seizure ([place])   . Peptic ulcer disease 04/13/2013  . GERD (gastroesophageal reflux disease) 04/13/2013  . Abdominal pain, epigastric 04/13/2013    Past Surgical History:  Procedure Laterality Date  . CLOSED REDUCTION NASAL FRACTURE N/A 06/04/2014   Procedure: CLOSED REDUCTION NASAL FRACTURE;  Surgeon: Ascencion Dike, MD;  Location: [place];  Service: ENT;  Laterality: N/A;  . CRANIOTOMY Right 01/27/2016   Procedure: RIGHT FRONTAL CRANIOTOMY FOR MENINGIOMA;  Surgeon: Kristeen Miss, MD;  Location: [place] [facility];  Service: Neurosurgery;  Laterality: Right;  RIGHT FRONTAL CRANIOTOMY FOR MENINGIOMA  . DIAGNOSTIC LAPAROSCOPY     - stomach  . ESOPHAGOGASTRODUODENOSCOPY ENDOSCOPY         Home Medications    Prior to Admission medications   Medication Sig Start Date End Date Taking? Authorizing Provider  dexamethasone (DECADRON) 1 MG tablet 2 tablets twice daily for 2 days, one tablet twice daily for 2 days, one tablet daily for 2 days. 01/30/16   Kristeen Miss, MD  dexamethasone (DECADRON) 2 MG tablet Take 1 tablet (2 mg total) by mouth every 12 (twelve) hours. 01/20/16   Verlee Monte, MD  HYDROcodone-acetaminophen (NORCO/VICODIN) 5-325 MG tablet Take 1-2 tablets by mouth every 4 (four) hours as needed for moderate pain. 01/30/16  Kristeen Miss, MD  levETIRAcetam (KEPPRA) 500 MG tablet Take 1 tablet (500 mg total) by mouth 2 (two) times daily. 01/20/16   Verlee Monte, MD  levETIRAcetam (KEPPRA) 500 MG tablet Take 1 tablet (500 mg total) by mouth 2 (two) times daily. 01/30/16   Kristeen Miss, MD  nicotine (NICODERM CQ) 14 mg/24hr patch Place 1 patch (14 mg total) onto the skin daily. 01/30/16   Kristeen Miss, MD  simvastatin (ZOCOR) 20 MG tablet Take 1 tablet (20 mg total) by mouth daily at 6 PM. 01/20/16   Verlee Monte, MD    Family History Family History    Problem Relation Age of Onset  . Diabetes Maternal Grandmother   . Diabetes Paternal Grandfather   . Hypertension Paternal Grandfather   . Hypertension Father   . Diabetes Father   . Sickle cell trait Father   . Hypertension Mother   . Diabetes Mother   . Sickle cell trait Mother     Social History Social History  Substance Use Topics  . Smoking status: Current Every Day Smoker    Packs/day: 0.50    Years: 20.00    Types: Cigarettes  . Smokeless tobacco: Never Used  . Alcohol use 0.0 oz/week     Comment: 40 oz per day     Allergies   Patient has no known allergies.   Review of Systems Review of Systems  Eyes: Positive for photophobia.  Neurological: Positive for seizures and headaches.  All other systems reviewed and are negative.    Physical Exam Updated Vital Signs BP 125/90 (BP Location: Left Arm)   Pulse 92   Temp 98.3 F (36.8 C) (Oral)   Resp 20   Ht 6\' 3"  (1.905 m)   Wt 240 lb (108.9 kg)   SpO2 96%   BMI 30.00 kg/m   Physical Exam  Constitutional: He is oriented to person, place, and time. He appears well-developed and well-nourished. No distress.  HENT:  Head: Normocephalic and atraumatic.  Mouth/Throat: Oropharynx is clear and moist.  Eyes: EOM are normal. Pupils are equal, round, and reactive to light.  Neck: Normal range of motion. Neck supple.  Cardiovascular: Normal rate and regular rhythm.  Exam reveals no friction rub.   No murmur heard. Pulmonary/Chest: Effort normal and breath sounds normal. No respiratory distress. He has no wheezes. He has no rales.  Abdominal: Soft. Bowel sounds are normal. He exhibits no distension. There is no tenderness.  Musculoskeletal: Normal range of motion. He exhibits no edema.  Neurological: He is alert and oriented to person, place, and time. No cranial nerve deficit. He exhibits normal muscle tone. Coordination normal.  Skin: Skin is warm and dry. He is not diaphoretic.  Nursing note and vitals  reviewed.    ED Treatments / Results  DIAGNOSTIC STUDIES: Oxygen Saturation is 96 percent on room air which is normal by my interpretation.    COORDINATION OF CARE: 3:57 AM Discussed treatment plan with pt at bedside and pt agreed to plan.  Labs (all labs ordered are listed, but only abnormal results are displayed) Labs Reviewed - No data to display  EKG  EKG Interpretation None       Radiology No results found.  Procedures Procedures (including critical care time)  Medications Ordered in ED Medications - No data to display   Initial Impression / Assessment and Plan / ED Course  I have reviewed the triage vital signs and the nursing notes.  Pertinent labs & imaging results that were  available during my care of the patient were reviewed by me and considered in my medical decision making (see chart for details).  Clinical Course     Patient with history of craniotomy for meningioma. He presents today with complaints of seizures. He stopped taking his Keppra 1 week ago because he was running low. He is neurologically intact and I have witnessed no further seizure activity while in the emergency department. His laboratory studies are unremarkable. He received IV Keppra. He will be discharged with a prescription for his Keppra is to follow-up as scheduled next week with Dr. Ellene Route.  Final Clinical Impressions(s) / ED Diagnoses   Final diagnoses:  None    New Prescriptions New Prescriptions   No medications on file  I personally performed the services described in this documentation, which was scribed in my presence. The recorded information has been reviewed and is accurate.       Veryl Speak, MD 11/17/16 [place], MD 12/01/16 [phone]

## 2016-11-17 NOTE — ED Triage Notes (Signed)
Pt has been out of his decadron for a month, he hasn't had his Keppra since last week, he states he has two left and has a refill waiting on him

## 2016-11-17 NOTE — Discharge Instructions (Signed)
Keppra as prescribed.  Follow-up with Dr. Ellene Route next week as scheduled, and return to the emergency department if your symptoms significantly worsen or change.

## 2016-11-17 NOTE — ED Triage Notes (Signed)
Pt c/o headache for past month; pt states he had a seizure this past week; pt had brain tumor removed this past march; tumor was found after pt began having headaches and seizure-like activity; pt also admits to having memory problems and is unable to answer all triage questions completely

## 2017-04-01 ENCOUNTER — Ambulatory Visit: Payer: Medicaid Other | Attending: Internal Medicine | Admitting: Internal Medicine

## 2017-04-01 ENCOUNTER — Encounter: Payer: Self-pay | Admitting: Internal Medicine

## 2017-04-01 ENCOUNTER — Encounter: Payer: Self-pay | Admitting: Gastroenterology

## 2017-04-01 ENCOUNTER — Ambulatory Visit: Payer: Medicaid Other | Attending: Internal Medicine | Admitting: Licensed Clinical Social Worker

## 2017-04-01 VITALS — BP 126/87 | HR 72 | Temp 98.7°F | Resp 16 | Wt 238.0 lb

## 2017-04-01 DIAGNOSIS — Z131 Encounter for screening for diabetes mellitus: Secondary | ICD-10-CM | POA: Insufficient documentation

## 2017-04-01 DIAGNOSIS — Z8673 Personal history of transient ischemic attack (TIA), and cerebral infarction without residual deficits: Secondary | ICD-10-CM | POA: Diagnosis not present

## 2017-04-01 DIAGNOSIS — K219 Gastro-esophageal reflux disease without esophagitis: Secondary | ICD-10-CM | POA: Insufficient documentation

## 2017-04-01 DIAGNOSIS — R569 Unspecified convulsions: Secondary | ICD-10-CM | POA: Diagnosis not present

## 2017-04-01 DIAGNOSIS — E785 Hyperlipidemia, unspecified: Secondary | ICD-10-CM | POA: Diagnosis not present

## 2017-04-01 DIAGNOSIS — Z833 Family history of diabetes mellitus: Secondary | ICD-10-CM | POA: Insufficient documentation

## 2017-04-01 DIAGNOSIS — F419 Anxiety disorder, unspecified: Secondary | ICD-10-CM

## 2017-04-01 DIAGNOSIS — N182 Chronic kidney disease, stage 2 (mild): Secondary | ICD-10-CM | POA: Diagnosis not present

## 2017-04-01 DIAGNOSIS — F17210 Nicotine dependence, cigarettes, uncomplicated: Secondary | ICD-10-CM | POA: Insufficient documentation

## 2017-04-01 DIAGNOSIS — Z720 Tobacco use: Secondary | ICD-10-CM | POA: Diagnosis not present

## 2017-04-01 DIAGNOSIS — Z8249 Family history of ischemic heart disease and other diseases of the circulatory system: Secondary | ICD-10-CM | POA: Diagnosis not present

## 2017-04-01 DIAGNOSIS — Z0001 Encounter for general adult medical examination with abnormal findings: Secondary | ICD-10-CM | POA: Diagnosis present

## 2017-04-01 DIAGNOSIS — K279 Peptic ulcer, site unspecified, unspecified as acute or chronic, without hemorrhage or perforation: Secondary | ICD-10-CM | POA: Insufficient documentation

## 2017-04-01 DIAGNOSIS — Z9889 Other specified postprocedural states: Secondary | ICD-10-CM | POA: Insufficient documentation

## 2017-04-01 DIAGNOSIS — M542 Cervicalgia: Secondary | ICD-10-CM | POA: Diagnosis not present

## 2017-04-01 DIAGNOSIS — Z1211 Encounter for screening for malignant neoplasm of colon: Secondary | ICD-10-CM | POA: Diagnosis not present

## 2017-04-01 DIAGNOSIS — K4090 Unilateral inguinal hernia, without obstruction or gangrene, not specified as recurrent: Secondary | ICD-10-CM | POA: Diagnosis not present

## 2017-04-01 DIAGNOSIS — F329 Major depressive disorder, single episode, unspecified: Secondary | ICD-10-CM | POA: Diagnosis not present

## 2017-04-01 DIAGNOSIS — R200 Anesthesia of skin: Secondary | ICD-10-CM | POA: Diagnosis not present

## 2017-04-01 LAB — POCT GLYCOSYLATED HEMOGLOBIN (HGB A1C): HEMOGLOBIN A1C: 5

## 2017-04-01 MED ORDER — ATORVASTATIN CALCIUM 10 MG PO TABS: 10.0000 mg | ORAL_TABLET | Freq: Every day | ORAL | 3 refills | Status: DC

## 2017-04-01 MED ORDER — ESCITALOPRAM OXALATE 10 MG PO TABS: ORAL_TABLET | ORAL | 3 refills | Status: DC

## 2017-04-01 MED ORDER — DICLOFENAC SODIUM 1 % TD GEL: 2.0000 g | Freq: Four times a day (QID) | TRANSDERMAL | 0 refills | Status: DC

## 2017-04-01 MED ORDER — NICOTINE 7 MG/24HR TD PT24: 7.0000 mg | MEDICATED_PATCH | Freq: Every day | TRANSDERMAL | 1 refills | Status: DC

## 2017-04-01 MED FILL — DICLOFENAC SODIUM 1% GEL: 1 | 12 days supply | Qty: 100 | Fill #0

## 2017-04-01 MED FILL — ?ESCITALOPRAM 10 MG TABLET: 10 | 30 days supply | Qty: 30 | Fill #0

## 2017-04-01 MED FILL — ?ATORVASTATIN 10 MG TABLET: 10 | 30 days supply | Qty: 30 | Fill #0

## 2017-04-01 NOTE — Patient Instructions (Addendum)
Use the nicotine patches as discussed.  Start Lexapro daily to help with depression/anxiety.  Follow up or call if you have increase depression/anxiety while on the medication.  Start Lipitor 10 mg for cholesterol.   Please inform your neurosurgeon about the intermittent numbness over the left eye lid.    You have been referred for colonoscopy.   Hernia A hernia happens when an organ or tissue inside your body pushes out through a weak spot in the belly (abdomen). Follow these instructions at home:  Avoid stretching or overusing (straining) the muscles near the hernia.  Do not lift anything heavier than 10 lb (4.5 kg).  Use the muscles in your leg when you lift something up. Do not use the muscles in your back.  When you cough, try to cough gently.  Eat a diet that has a lot of fiber. Eat lots of fruits and vegetables.  Drink enough fluids to keep your pee (urine) clear or pale yellow. Try to drink 6-8 glasses of water a day.  Take medicines to make your poop soft (stool softeners) as told by your doctor.  Lose weight, if you are overweight.  Do not use any tobacco products, including cigarettes, chewing tobacco, or electronic cigarettes. If you need help quitting, ask your doctor.  Keep all follow-up visits as told by your doctor. This is important. Contact a doctor if:  The skin by the hernia gets puffy (swollen) or red.  The hernia is painful. Get help right away if:  You have a fever.  You have belly pain that is getting worse.  You feel sick to your stomach (nauseous) or you throw up (vomit).  You cannot push the hernia back in place by gently pressing on it while you are lying down.  The hernia: ? Changes in shape or size. ? Is stuck outside your belly. ? Changes color. ? Feels hard or tender. This information is not intended to replace advice given to you by your health care provider. Make sure you discuss any questions you have with your health care  provider. Document Released: 04/07/2010 Document Revised: 03/25/2016 Document Reviewed: 08/28/2014 Elsevier Interactive Patient Education  [name].

## 2017-04-01 NOTE — Progress Notes (Signed)
Patient ID: GEOVANNI RAHMING, male    DOB: 10-27-1966  MRN: [id number]  CC: Establish Care; Hernia; and Insomnia   Subjective: Breyon Sigg is a 51 y.o. male who presents to re-est care His concerns today include:  Pt with hx of SZ disorder, CVA, CKD stage 2, tob dep., meningioma s/p craniotomy and PUD (actually duodenal inflammation on EGD 2014, stomach mucosa normal).  1. C/o having hernia in LT groin x 3 yrs -has increased in size, it is reducible. -pain "every now and then."  2. C/o posterior neck pain x 1 mth. Alternates side. No initiating factors -intermittent.  Last 1-2 wks when it comes on. -feels like a tightness with movement. -not taking anything for it -some numbness in last 3 toes on RT foot since brain surgery -also reports intermittent numbness across RT upper eye lid  X 3 mths.  Saw neurosurgeon last mth but forgot to mention it.  Had brain MRI but no results as yet.  -Keppra dose increased and needs refill but does not recall the increased dose  3.  PHQ and GAD 7 + -depressed about not being able to work.  Was a Furniture conservator/restorer prior to brain surgery.  Told he can not do that any more and he does not want to do anything else -"I feel down most days but sometimes I can't shake it.  I can't do what I use to do."  Use to like working in yard but gets dizziness with this and takes 1-2 days to recuperate. -going through disability process.  Working with [employer]  4.  HL:  Out of Simvastatin for several mths  5.  Tob dep: down to 3 cig/day from 1/2 pk.  Smoked for 10 yrs. would like to quit  HM: Due for colonoscopy  Patient Active Problem List   Diagnosis Date Noted  . Diabetes mellitus screening 04/01/2017  . S/P craniotomy 01/27/2016  . Benign meningioma ([place]) 01/27/2016  . Meningioma ([place]) 01/19/2016  . 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 CKD (chronic kidney disease) stage 2, GFR 60-89 ml/min 01/18/2016  . Tobacco abuse 01/18/2016  . Cerebrovascular  accident (CVA) ([place])   . Seizure ([place])   . Peptic ulcer disease 04/13/2013  . GERD (gastroesophageal reflux disease) 04/13/2013  . Abdominal pain, epigastric 04/13/2013     Current Outpatient Prescriptions on File Prior to Visit  Medication Sig Dispense Refill  . levETIRAcetam (KEPPRA) 500 MG tablet Take 1 tablet (500 mg total) by mouth 2 (two) times daily. 60 tablet 0   No current facility-administered medications on file prior to visit.     No Known Allergies  Social History   Social History  . Marital status: Single    Spouse name: N/A  . Number of children: 2  . Years of education: N/A   Occupational History  .  [employer]   Social History Main Topics  . Smoking status: Current Every Day Smoker    Packs/day: 0.50    Years: 20.00    Types: Cigarettes  . Smokeless tobacco: Never Used  . Alcohol use 0.0 oz/week     Comment: 40 oz per day  . Drug use: No     Comment: denies  . Sexual activity: Yes   Other Topics Concern  . Not on file   Social History Narrative  . No narrative on file    Family History  Problem Relation Age of Onset  . Diabetes Maternal Grandmother   . Diabetes Paternal Grandfather   .  Hypertension Paternal Grandfather   . Hypertension Father   . Diabetes Father   . Sickle cell trait Father   . Hypertension Mother   . Diabetes Mother   . Sickle cell trait Mother     Past Surgical History:  Procedure Laterality Date  . CLOSED REDUCTION NASAL FRACTURE N/A 06/04/2014   Procedure: CLOSED REDUCTION NASAL FRACTURE;  Surgeon: Ascencion Dike, MD;  Location: [place];  Service: ENT;  Laterality: N/A;  . CRANIOTOMY Right 01/27/2016   Procedure: RIGHT FRONTAL CRANIOTOMY FOR MENINGIOMA;  Surgeon: Kristeen Miss, MD;  Location: [place] [facility];  Service: Neurosurgery;  Laterality: Right;  RIGHT FRONTAL CRANIOTOMY FOR MENINGIOMA  . DIAGNOSTIC LAPAROSCOPY     - stomach  . ESOPHAGOGASTRODUODENOSCOPY ENDOSCOPY      ROS: Review of Systems    Constitutional: Positive for activity change. Negative for appetite change.  Eyes: Eye discharge: not able to do as much as he would like due to chronic dizziness post brain surgery.  Respiratory: Negative for shortness of breath.        Had a mild cough last week. Noticed streaks of blood one time in his sputum..  Cardiovascular: Negative for chest pain and palpitations.  Gastrointestinal: Negative for blood in stool, constipation and diarrhea.  Psychiatric/Behavioral: Positive for dysphoric mood and sleep disturbance. The patient is nervous/anxious.     PHYSICAL EXAM: BP 126/87   Pulse 72   Temp 98.7 F (37.1 C) (Oral)   Resp 16   Wt 238 lb (108 kg)   SpO2 98%   BMI 29.75 kg/m   Physical Exam General appearance - alert, well appearing, and in no distress Mental status - alert, oriented to person, place, and time, normal mood, behavior, speech, dress, motor activity, and thought processes Eyes - pupils equal and reactive, extraocular eye movements intact Mouth - mucous membranes moist, pharynx normal without lesions Neck - supple, no significant adenopathy Lymphatics -no cervical or axillary lymphadenopathy Chest - clear to auscultation, no wheezes, rales or rhonchi, symmetric air entry Heart - normal rate, regular rhythm, normal S1, S2, no murmurs, rubs, clicks or gallops Neurological - cranial nerves II through XII intact, motor and sensory grossly normal bilaterally Musculoskeletal - mild tenderness on palpation of trapezius muscle at the back of the neck on both sides. Mild discomfort with rotation and flexion of the neck. Extremities - no lower extremity edema GU - golf ball size left inguinal hernia palpable with Valsalva maneuver  Depression screen PHQ 2/9 04/01/2017  Decreased Interest 2  Down, Depressed, Hopeless 2  PHQ - 2 Score 4  Altered sleeping 3  Tired, decreased energy 2  Change in appetite 3  Feeling bad or failure about yourself  2  Trouble concentrating  (No Data)  Moving slowly or fidgety/restless 2  Suicidal thoughts 0  PHQ-9 Score 16   GAD 7 : Generalized Anxiety Score 04/01/2017  Nervous, Anxious, on Edge 2  Control/stop worrying 2  Worry too much - different things 2  Trouble relaxing 3  Restless 2  Easily annoyed or irritable 3  Afraid - awful might happen 3  Total GAD 7 Score 17    Results for orders placed or performed in visit on 04/01/17  POCT glycosylated hemoglobin (Hb A1C)  Result Value Ref Range   Hemoglobin A1C 5.0      ASSESSMENT AND PLAN: 1. Unilateral inguinal hernia without obstruction or gangrene, recurrence not specified - Ambulatory referral to General Surgery -Precautions given no signs of  strangulation which would warrant being seen in the emergency room  2. Acute neck pain -Likely MSK in nature - diclofenac sodium (VOLTAREN) 1 % GEL; Apply 2 g topically 4 (four) times daily.  Dispense: 100 g; Refill: 0  3. Diabetes mellitus screening -Screen was negative - POCT glycosylated hemoglobin (Hb A1C)  4. Hyperlipidemia, unspecified hyperlipidemia type -Change simvastatin to Lipitor. - Hepatic Function Panel - atorvastatin (LIPITOR) 10 MG tablet; Take 1 tablet (10 mg total) by mouth daily.  Dispense: 90 tablet; Refill: 3  5. Anxiety and depression -Patient feels his symptoms are significant enough that he would like to be tried with an antidepressant and referred to LCSW -Started on Lexapro. Patient told it would take about 4-6 weeks before he starts feeling the effects of the medication. Follow-up immediately if depression and anxiety worsens. - escitalopram (LEXAPRO) 10 MG tablet; 1/2 tAB PO daily x 7 days then 1 tab daily thereafter.  Dispense: 30 tablet; Refill: 3  6. Tobacco abuse Patient advised to quit smoking. Discussed health risks associated with smoking including lung and other types of cancers, chronic lung diseases and CV risks.. Pt ready to give trail of quitting.  Discussed methods to  help quit including quitting cold [place], use of NRT, Chantix and Bupropion.  - nicotine (NICODERM CQ - DOSED IN MG/24 HR) 7 mg/24hr patch; Place 1 patch (7 mg total) onto the skin daily.  Dispense: 30 patch; Refill: 1  7. Colon cancer screening Referred to GI for colonoscopy.  8.  Patient advised to call the neurosurgeon's office for refill on his Keppra since he does not recall his dose for me to refill today.  I also advise that he speak with the neurosurgeon about the intermittent numbness that he experiences across the right upper eyelid  Patient was given the opportunity to ask questions.  Patient verbalized understanding of the plan and was able to repeat key elements of the plan.   Orders Placed This Encounter  Procedures  . Hepatic Function Panel  . Ambulatory referral to General Surgery  . Ambulatory referral to Gastroenterology  . POCT glycosylated hemoglobin (Hb A1C)     Requested Prescriptions   Signed Prescriptions Disp Refills  . nicotine (NICODERM CQ - DOSED IN MG/24 HR) 7 mg/24hr patch 30 patch 1    Sig: Place 1 patch (7 mg total) onto the skin daily.  Marland Kitchen escitalopram (LEXAPRO) 10 MG tablet 30 tablet 3    Sig: 1/2 tAB PO daily x 7 days then 1 tab daily thereafter.  . diclofenac sodium (VOLTAREN) 1 % GEL 100 g 0    Sig: Apply 2 g topically 4 (four) times daily.  Marland Kitchen atorvastatin (LIPITOR) 10 MG tablet 90 tablet 3    Sig: Take 1 tablet (10 mg total) by mouth daily.    Return in about 3 months (around 07/02/2017).  Karle Plumber, MD, FACP

## 2017-04-01 NOTE — BH Specialist Note (Signed)
Integrated Behavioral Health Initial Visit  MRN: [id number] Name: Jeremy Soto   Session Start time: 11:00 AM Session End time: 11:30 AM Total time: 30 minutes  Type of Service: [place] Interpretor:No. Interpretor Name and Language: N/A   Warm Hand Off Completed.       SUBJECTIVE: Jeremy Soto is a 51 y.o. male accompanied by patient. Patient was referred by Dr. Wynetta Emery for depression. Patient reports the following symptoms/concerns: feelings of sadness and worry, difficulty sleeping, decreased energy, and irritability Duration of problem: 1 year; Severity of problem: moderate  OBJECTIVE: Mood: Depressed and Affect: Appropriate Risk of harm to self or others: No plan to harm self or others   LIFE CONTEXT: Family and Social: Pt receives emotional support from his parents, sibling, and friends.  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 He receives Medicaid. Self-Care: Pt smokes cigarettes (3 daily) noting he has reduced use from 10 daily Life Changes: Pt is no longer able to work resulting in financial strain.   GOALS ADDRESSED: Patient will reduce symptoms of: anxiety and depression and increase knowledge and/or ability of: coping skills and also: Increase healthy adjustment to current life circumstances and Increase adequate support systems for patient/family   INTERVENTIONS: Solution-Focused Strategies, Supportive Counseling, Psychoeducation and/or Health Education and Link to [employer]  Standardized Assessments completed: PHQ 2&9  ASSESSMENT: Patient currently experiencing depression and anxiety triggered by decline in physical health. He is no longer able to work resulting in financial strain. Patient receives strong support from family and friends. Patient may benefit from psychoeducation, psychotherapy, and medication management. LCSWA educated pt on the correlation between  physical and mental health. LCSWA discussed benefits of applying healthy coping skills to decrease symptoms. Pt was successful in identifying healthy strategies to decrease tobacco use and cope with mental health. Pt is open to medication management through PCP. LCSWA provided pt with community resources for crisis intervention, psychotherapy, and medication management.  PLAN: 1. Follow up with behavioral health clinician on : Pt was encouraged to contact LCSWA if symptoms worsen or fail to improve to schedule behavioral appointments at [facility]. 2. Behavioral recommendations: LCSWA recommends that pt apply healthy coping skills discussed, comply with medication management, and utilize provided resources. Pt is encouraged to schedule follow up appointment with LCSWA 3. Referral(s): [place] (LME/Outside Clinic) 4. "From scale of 1-10, how likely are you to follow plan?": 8/10  Rebekah Chesterfield, LCSW 04/04/17 4:13 PM

## 2017-04-02 LAB — HEPATIC FUNCTION PANEL
ALBUMIN: 5.3 g/dL (ref 3.5–5.5)
ALK PHOS: 89 IU/L (ref 39–117)
ALT: 13 IU/L (ref 0–44)
AST: 17 IU/L (ref 0–40)
BILIRUBIN TOTAL: 0.9 mg/dL (ref 0.0–1.2)
Bilirubin, Direct: 0.15 mg/dL (ref 0.00–0.40)
Total Protein: 8.1 g/dL (ref 6.0–8.5)

## 2017-05-20 ENCOUNTER — Telehealth: Payer: Self-pay | Admitting: *Deleted

## 2017-05-20 NOTE — Telephone Encounter (Signed)
Pt no show for previsit 05/20/17. Attempted to call pt's cell phone, number cannot receive message. Called mother's phone and left a generic message. Called [facility].Marland Kitchenas shown pt is a resident there, he is no longer a resident there. Called mother's number and left a message regarding rescheduling previsit today before 5 pm or colonoscopy scheduled 06/03/17 would be cancelled and both appointments would need to be rescheduled. Reached Lady Deutscher and explained the need for above, she was going to try to reach patient.

## 2017-05-20 NOTE — Telephone Encounter (Signed)
New previsit appointment was scheduled for 05/23/17 at 130 pm

## 2017-05-26 ENCOUNTER — Ambulatory Visit (AMBULATORY_SURGERY_CENTER): Payer: Self-pay | Admitting: *Deleted

## 2017-05-26 VITALS — Wt 239.4 lb

## 2017-05-26 DIAGNOSIS — Z1211 Encounter for screening for malignant neoplasm of colon: Secondary | ICD-10-CM

## 2017-05-26 NOTE — Progress Notes (Signed)
Patient here for PV for colonoscopy scheduled 06/03/17. Pt has a history of seizures and reports he has had an increase in number of seizures recently, most current being this past Monday or Tuesday (2-3 days ago). Discussed with Josh CRNA, pt needs to see neurology and receive clearance from that office in order to proceed with colonoscopy. I phoned Dr. Clarice Pole office to schedule an appointment for this gentleman. Spoke with Deresha. Caren Griffins is scheduler and was unavailable so Simone Curia is going to have [facility] [facility] call patient to schedule an appointment for followup.  Cancelled colonoscopy and advised patient to call back to schedule when he has seizures under control and has received clearance from neurology.

## 2017-06-03 ENCOUNTER — Encounter: Payer: Medicaid Other | Admitting: Gastroenterology

## 2017-07-07 ENCOUNTER — Ambulatory Visit: Payer: Medicaid Other | Admitting: Internal Medicine

## 2017-08-04 ENCOUNTER — Ambulatory Visit: Payer: Medicaid Other | Attending: Internal Medicine | Admitting: Internal Medicine

## 2017-08-04 ENCOUNTER — Encounter: Payer: Self-pay | Admitting: Internal Medicine

## 2017-08-04 ENCOUNTER — Ambulatory Visit: Payer: Medicaid Other | Admitting: Licensed Clinical Social Worker

## 2017-08-04 VITALS — BP 143/94 | HR 72 | Temp 98.4°F | Resp 16 | Wt 247.8 lb

## 2017-08-04 DIAGNOSIS — Z720 Tobacco use: Secondary | ICD-10-CM

## 2017-08-04 DIAGNOSIS — Z833 Family history of diabetes mellitus: Secondary | ICD-10-CM | POA: Diagnosis not present

## 2017-08-04 DIAGNOSIS — E785 Hyperlipidemia, unspecified: Secondary | ICD-10-CM | POA: Diagnosis not present

## 2017-08-04 DIAGNOSIS — G40909 Epilepsy, unspecified, not intractable, without status epilepticus: Secondary | ICD-10-CM | POA: Diagnosis not present

## 2017-08-04 DIAGNOSIS — M25511 Pain in right shoulder: Secondary | ICD-10-CM | POA: Insufficient documentation

## 2017-08-04 DIAGNOSIS — Z8249 Family history of ischemic heart disease and other diseases of the circulatory system: Secondary | ICD-10-CM | POA: Insufficient documentation

## 2017-08-04 DIAGNOSIS — F17210 Nicotine dependence, cigarettes, uncomplicated: Secondary | ICD-10-CM | POA: Diagnosis not present

## 2017-08-04 DIAGNOSIS — Z9889 Other specified postprocedural states: Secondary | ICD-10-CM | POA: Diagnosis not present

## 2017-08-04 DIAGNOSIS — Z1211 Encounter for screening for malignant neoplasm of colon: Secondary | ICD-10-CM | POA: Diagnosis not present

## 2017-08-04 DIAGNOSIS — F329 Major depressive disorder, single episode, unspecified: Secondary | ICD-10-CM | POA: Diagnosis not present

## 2017-08-04 DIAGNOSIS — N182 Chronic kidney disease, stage 2 (mild): Secondary | ICD-10-CM | POA: Insufficient documentation

## 2017-08-04 DIAGNOSIS — Z79899 Other long term (current) drug therapy: Secondary | ICD-10-CM | POA: Diagnosis not present

## 2017-08-04 DIAGNOSIS — I129 Hypertensive chronic kidney disease with stage 1 through stage 4 chronic kidney disease, or unspecified chronic kidney disease: Secondary | ICD-10-CM | POA: Insufficient documentation

## 2017-08-04 DIAGNOSIS — F419 Anxiety disorder, unspecified: Secondary | ICD-10-CM

## 2017-08-04 DIAGNOSIS — I10 Essential (primary) hypertension: Secondary | ICD-10-CM | POA: Diagnosis not present

## 2017-08-04 MED ORDER — ACETAMINOPHEN-CODEINE #3 300-30 MG PO TABS: 2.0000 | ORAL_TABLET | Freq: Every evening | ORAL | 0 refills | Status: DC | PRN

## 2017-08-04 MED ORDER — ESCITALOPRAM OXALATE 10 MG PO TABS: 10.0000 mg | ORAL_TABLET | Freq: Every day | ORAL | 6 refills | Status: DC

## 2017-08-04 MED ORDER — ATORVASTATIN CALCIUM 10 MG PO TABS: 10.0000 mg | ORAL_TABLET | Freq: Every day | ORAL | 3 refills | Status: DC

## 2017-08-04 NOTE — Progress Notes (Signed)
Patient ID: Jeremy Soto, male    DOB: [birth year]  MRN: [id number]  CC: Follow-up   Subjective: Jeremy Soto is a 51 y.o. male who presents for chronic disease management. Last seen 04/2017 His concerns today include:  Pt with hx of tob dep, HL, depression/anxiety, meningioma status post craniotomy 12/2015(initial pt was thought to have CVA but brain lesion discovered on MRI), generalized seizure diagnosed at the time of meningioma.  1. Dep/Anx: Patient started on Lexapro on last visit. Was doing well until he ran out. Requesting a refill today  2. Colon CA screen:  Referred for colon cancer screening on last visit. Told to see neurosurgeon first because pt reported increase sz. Reportedly saw Jeremy Soto in August. Pt said he was inadvertently taking Keppra 500 mg just once a day. Told to take twice a day. No sz since dose increase -does not drive  3. Pain in RT shoulder: Had sz in August and fell b/w wall and bed. Thinks he landed on RT shoulder. Pain in RT shoulder since then -popping sound with certain movements, pain radiates to neck -pain worse at nights and when reaching for objects. Not sleeping well because of the pain  4. Tob: never got patches. Smoking 4 cig/day. Still wants to quit  HM: got flu shot at [facility] last mth per pt  Patient Active Problem List   Diagnosis Date Noted  . Diabetes mellitus screening 04/01/2017  . S/P craniotomy 01/27/2016  . Benign meningioma ([place]) 01/27/2016  . CKD (chronic kidney disease) stage 2, GFR 60-89 ml/min 01/18/2016  . Tobacco abuse 01/18/2016  . Seizure ([place])   . GERD (gastroesophageal reflux disease) 04/13/2013     Current Outpatient Prescriptions on File Prior to Visit  Medication Sig Dispense Refill  . diclofenac sodium (VOLTAREN) 1 % GEL Apply 2 g topically 4 (four) times daily. 100 g 0  . levETIRAcetam (KEPPRA) 500 MG tablet Take 1 tablet (500 mg total) by mouth 2 (two) times daily. 60 tablet 0  . nicotine (NICODERM CQ -  DOSED IN MG/24 HR) 7 mg/24hr patch Place 1 patch (7 mg total) onto the skin daily. (Patient not taking: Reported on 05/26/2017) 30 patch 1   No current facility-administered medications on file prior to visit.     No Known Allergies  Social History   Social History  . Marital status: Single    Spouse name: N/A  . Number of children: 2  . Years of education: N/A   Occupational History  .  [employer]   Social History Main Topics  . Smoking status: Current Every Day Smoker    Packs/day: 0.33    Years: 20.00    Types: Cigarettes  . Smokeless tobacco: Never Used  . Alcohol use 0.6 oz/week    1 Cans of beer per week  . Drug use: No     Comment: denies  . Sexual activity: Yes   Other Topics Concern  . Not on file   Social History Narrative  . No narrative on file    Family History  Problem Relation Age of Onset  . Diabetes Maternal Grandmother   . Diabetes Paternal Grandfather   . Hypertension Paternal Grandfather   . Hypertension Father   . Diabetes Father   . Sickle cell trait Father   . Hypertension Mother   . Diabetes Mother   . Sickle cell trait Mother   . Colon cancer Neg Hx   . Rectal cancer Neg Hx   .  Stomach cancer Neg Hx   . Esophageal cancer Neg Hx     Past Surgical History:  Procedure Laterality Date  . CLOSED REDUCTION NASAL FRACTURE N/A 06/04/2014   Procedure: CLOSED REDUCTION NASAL FRACTURE;  Surgeon: Ascencion Dike, MD;  Location: [facility];  Service: ENT;  Laterality: N/A;  . CRANIOTOMY Right 01/27/2016   Procedure: RIGHT FRONTAL CRANIOTOMY FOR MENINGIOMA;  Surgeon: Kristeen Miss, MD;  Location: [facility];  Service: Neurosurgery;  Laterality: Right;  RIGHT FRONTAL CRANIOTOMY FOR MENINGIOMA  . DIAGNOSTIC LAPAROSCOPY     - stomach  . ESOPHAGOGASTRODUODENOSCOPY ENDOSCOPY      ROS: Review of Systems Negative except as stated above PHYSICAL EXAM: BP (!) 143/94   Pulse 72   Temp 98.4 F (36.9 C) (Oral)   Resp 16   Wt 247 lb 12.8 oz  (112.4 kg)   SpO2 100%   BMI 30.97 kg/m   Repeat BP 140/96 Physical Exam  General appearance - alert, well appearing, and in no distress Mental status - alert, oriented to person, place, and time, normal mood, behavior, speech, dress, motor activity, and thought processes Mouth - mucous membranes moist, pharynx normal without lesions Neck - supple, no significant adenopathy Chest - clear to auscultation, no wheezes, rales or rhonchi, symmetric air entry Heart - normal rate, regular rhythm, normal S1, S2, no murmurs, rubs, clicks or gallops Neurological -mild end horizontal nystagmus. Otherwise cranial nerves intact. Power reduced in the right upper arm otherwise 5 out of 5 in other extremities. Gross sensation intact Musculoskeletal - right shoulder: No point tenderness. Power decreased on active resisted abduction. Drop arm test - exhibits mild to moderate discomfort but able to maintain resistance of movement  Extremities -no lower extremity edema  Results for orders placed or performed in visit on 04/01/17  Hepatic Function Panel  Result Value Ref Range   Total Protein 8.1 6.0 - 8.5 g/dL   Albumin 5.3 3.5 - 5.5 g/dL   Bilirubin Total 0.9 0.0 - 1.2 mg/dL   Bilirubin, Direct 0.15 0.00 - 0.40 mg/dL   Alkaline Phosphatase 89 39 - 117 IU/L   AST 17 0 - 40 IU/L   ALT 13 0 - 44 IU/L  POCT glycosylated hemoglobin (Hb A1C)  Result Value Ref Range   Hemoglobin A1C 5.0    Lab Results  Component Value Date   CHOL 274 (H) 01/19/2016   HDL 37 (L) 01/19/2016   LDLCALC 194 (H) 01/19/2016   TRIG 213 (H) 01/19/2016   CHOLHDL 7.4 01/19/2016    ASSESSMENT AND PLAN: 1. Anxiety and depression - escitalopram (LEXAPRO) 10 MG tablet; Take 1 tablet (10 mg total) by mouth daily. .  Dispense: 30 tablet; Refill: 6  2. Hyperlipidemia, unspecified hyperlipidemia type - atorvastatin (LIPITOR) 10 MG tablet; Take 1 tablet (10 mg total) by mouth daily.  Dispense: 90 tablet; Refill: 3  3. Tobacco  abuse Advised to quit. Call 1 800 quit now to get nicotine patches  4. Seizure disorder ([place]) -Advised not to drive until seizure free for at least 6-12 months. -Check Keppra level. If good will send back to GI to have his colonoscopy done - Levetiracetam level  5. Acute pain of right shoulder -Probably mild rotator cuff injury.  Given limited supply of Tylenol threes 2 units at bedtime. - Ambulatory referral to Orthopedic Surgery - DG Shoulder Right; Future - Basic Metabolic Panel - acetaminophen-codeine (TYLENOL #3) 300-30 MG tablet; Take 2 tablets by mouth at bedtime as needed  for moderate pain.  Dispense: 30 tablet; Refill: 0  6. Hypertension, unspecified type -DASH Diet advised -Appointment with the clinical pharmacists in 1 week for recheck. If still elevated would start him on Norvasc  7. Colon cancer screening See #4 above  Patient was given the opportunity to ask questions.  Patient verbalized understanding of the plan and was able to repeat key elements of the plan.   Orders Placed This Encounter  Procedures  . DG Shoulder Right  . Levetiracetam level  . Basic Metabolic Panel  . Ambulatory referral to Orthopedic Surgery     Requested Prescriptions   Signed Prescriptions Disp Refills  . escitalopram (LEXAPRO) 10 MG tablet 30 tablet 6    Sig: Take 1 tablet (10 mg total) by mouth daily. .  . atorvastatin (LIPITOR) 10 MG tablet 90 tablet 3    Sig: Take 1 tablet (10 mg total) by mouth daily.  Marland Kitchen acetaminophen-codeine (TYLENOL #3) 300-30 MG tablet 30 tablet 0    Sig: Take 2 tablets by mouth at bedtime as needed for moderate pain.    Return in about 3 months (around 11/04/2017).  Jeremy Plumber, MD, FACP

## 2017-08-04 NOTE — BH Specialist Note (Signed)
Integrated Behavioral Health Follow Up Visit  MRN: [id number] Name: Jeremy Soto  Number of [place] Clinician visits: 2/6 Session Start time: 1:40 PM  Session End time: 2:10 PM Total time: 30 minutes  Type of Service: [place] Interpretor:No. Interpretor Name and Language: N/A  SUBJECTIVE: Jeremy Soto is a 51 y.o. male accompanied by self Patient was referred by Dr. Wynetta Emery for depression and anxiety. Patient reports the following symptoms/concerns: chronic pain, feelings of sadness and worry, difficulty sleeping, decreased energy, suicidal ideations, and irritability Duration of problem: Ongoing; Severity of problem: moderately severe   OBJECTIVE: Mood: moderately severe and Affect: Depressed Risk of harm to self or others: No plan to harm self or others Pt reported SI within 2 weeks. He denied current SI/HI/AVH  LIFE CONTEXT: Family and Social: Pt receives emotional support from friends and parents. He reports ongoing conflict with friend, whom he is residing with  School/Work: Pt is unemployed and has applied for disability. He receives Medicaid Self-Care: Pt has increased tobacco use (4 daily) Pt is residing with another friend for a few days and is taking daily walks to assist in coping with stressors Life Changes: Pt has ran out of his blood pressure and depression medications due to transportation barriers. He is requesting refills from PCP, in addition, to changing his pharmacy for better access  GOALS ADDRESSED: Patient will: 1.  Reduce symptoms of: agitation, anxiety and depression  2.  Increase knowledge and/or ability of: coping skills  3.  Demonstrate ability to: Improve medication compliance  INTERVENTIONS: Interventions utilized:  Mindfulness or Psychologist, educational and Psychoeducation and/or Health Education Standardized Assessments completed: GAD-7 and PHQ 2&9 with C-SSRS  ASSESSMENT: Patient  currently experiencing increased agitation, depression, and anxiety triggered by chronic pain in rt shoulder and running out of medications due to transportation barriers. Pt reported SI within the last two weeks; however, denied plan or intent to harm self or others.    Patient is participating in medication management through PCP. LCSWA taught pt relaxation skills (i.e. Deep breathing) and provided psychoeducation techniques to cope with chronic pain. Crisis intervention resources were provided to pt, in addition, to a SCAT application to assist with transportation barriers.  PLAN: 1. Follow up with behavioral health clinician on : Pt was encouraged tocontact LCSWA if symptoms worsen or fail to improveto schedule behavioral appointments at [facility]. 2. Behavioral recommendations: LCSWA recommends that pt apply healthy coping skills discussed, comply with medication management, and utilize provided resources. Pt is encouraged to complete SCAT application to assist with transportation barriers. 3. Referral(s): [facility]) and [employer] Resources:  Transportation 4. "From scale of 1-10, how likely are you to follow plan?": 9/10  Rebekah Chesterfield, LCSW 08/08/17 3:49 PM

## 2017-08-04 NOTE — Patient Instructions (Signed)
Give pt a follow up appointment with Stacy in 1 week for recheck blood pressure.   Call 1-800-Quit now to get Nicotine patches.  I have sent refill on Lexapro to your pharmacy.   You should not drive until you are seizure free for at least one year.   If your

## 2017-08-05 DIAGNOSIS — F329 Major depressive disorder, single episode, unspecified: Secondary | ICD-10-CM | POA: Insufficient documentation

## 2017-08-05 DIAGNOSIS — I10 Essential (primary) hypertension: Secondary | ICD-10-CM | POA: Insufficient documentation

## 2017-08-05 DIAGNOSIS — F419 Anxiety disorder, unspecified: Secondary | ICD-10-CM

## 2017-08-05 DIAGNOSIS — E785 Hyperlipidemia, unspecified: Secondary | ICD-10-CM | POA: Insufficient documentation

## 2017-08-05 DIAGNOSIS — F32A Depression, unspecified: Secondary | ICD-10-CM | POA: Insufficient documentation

## 2017-08-08 ENCOUNTER — Telehealth: Payer: Self-pay

## 2017-08-08 LAB — BASIC METABOLIC PANEL
BUN / CREAT RATIO: 9 (ref 9–20)
BUN: 13 mg/dL (ref 6–24)
CALCIUM: 9.9 mg/dL (ref 8.7–10.2)
CO2: 24 mmol/L (ref 20–29)
Chloride: 103 mmol/L (ref 96–106)
Creatinine, Ser: 1.52 mg/dL — ABNORMAL HIGH (ref 0.76–1.27)
GFR, EST AFRICAN AMERICAN: 60 mL/min/{1.73_m2} (ref >59)
GFR, EST NON AFRICAN AMERICAN: 52 mL/min/{1.73_m2} — AB (ref >59)
Glucose: 107 mg/dL — ABNORMAL HIGH (ref 65–99)
POTASSIUM: 4.3 mmol/L (ref 3.5–5.2)
SODIUM: 141 mmol/L (ref 134–144)

## 2017-08-08 LAB — LEVETIRACETAM LEVEL: Levetiracetam Lvl: 5.8 ug/mL — ABNORMAL LOW (ref 10.0–40.0)

## 2017-08-08 NOTE — Telephone Encounter (Signed)
Contacted pt to go over lab results pt didn't answer lvm asking pt to give me a call at his earliest convenience  

## 2017-08-11 ENCOUNTER — Ambulatory Visit: Payer: Medicaid Other | Admitting: Pharmacist

## 2017-08-11 NOTE — Progress Notes (Deleted)
   S:    Patient arrives ***.    Presents to the clinic for hypertension evaluation. Patient was referred on 08/04/17.  Patient was last seen by Primary Care Provider on 08/04/17.   Patient {Actions; denies-reports:120008} adherence with medications.  Current BP Medications include:  none  Antihypertensives tried in the past include: none  Dietary habits include:   O:   Last 3 Office BP readings: BP Readings from Last 3 Encounters:  08/04/17 (!) 143/94  04/01/17 126/87  11/17/16 116/72    BMET    Component Value Date/Time   NA 141 08/04/2017 1558   K 4.3 08/04/2017 1558   CL 103 08/04/2017 1558   CO2 24 08/04/2017 1558   GLUCOSE 107 (H) 08/04/2017 1558   GLUCOSE 90 11/17/2016 0435   BUN 13 08/04/2017 1558   CREATININE 1.52 (H) 08/04/2017 1558   CREATININE 1.50 (H) 04/13/2013 1231   CALCIUM 9.9 08/04/2017 1558   GFRNONAA 52 (L) 08/04/2017 1558   GFRAA 60 08/04/2017 1558    A/P: History of elevated blood pressure in office, currently *** on current medications.  {Meds adjust:18428} ***.   Results reviewed and written information provided.   Total time in face-to-face counseling *** minutes.   F/U Clinic Visit with Dr. Marland Kitchen  Patient seen with Thornton Park, PharmD Candidate

## 2017-08-18 ENCOUNTER — Ambulatory Visit: Payer: Medicaid Other | Attending: Internal Medicine | Admitting: Pharmacist

## 2017-08-18 VITALS — BP 130/82 | HR 64

## 2017-08-18 DIAGNOSIS — I10 Essential (primary) hypertension: Secondary | ICD-10-CM | POA: Insufficient documentation

## 2017-08-18 DIAGNOSIS — Z0130 Encounter for examination of blood pressure without abnormal findings: Secondary | ICD-10-CM

## 2017-08-18 NOTE — Patient Instructions (Addendum)
Thanks for coming to see [place]!  Your blood pressure looks great!  Follow up with Dr. Wynetta Emery as scheduled in January

## 2017-08-18 NOTE — Progress Notes (Signed)
   S:    Patient arrives in good spirits.    Presents to the clinic for hypertension evaluation. Patient was referred on 08/04/17.  Patient was last seen by Primary Care Provider on 08/04/17.   Current BP Medications include:  none  Antihypertensives tried in the past include: none   O:   Last 3 Office BP readings: BP Readings from Last 3 Encounters:  08/18/17 130/82  08/04/17 (!) 143/94  04/01/17 126/87    BMET    Component Value Date/Time   NA 141 08/04/2017 1558   K 4.3 08/04/2017 1558   CL 103 08/04/2017 1558   CO2 24 08/04/2017 1558   GLUCOSE 107 (H) 08/04/2017 1558   GLUCOSE 90 11/17/2016 0435   BUN 13 08/04/2017 1558   CREATININE 1.52 (H) 08/04/2017 1558   CREATININE 1.50 (H) 04/13/2013 1231   CALCIUM 9.9 08/04/2017 1558   GFRNONAA 52 (L) 08/04/2017 1558   GFRAA 60 08/04/2017 1558    A/P: History of elevated blood pressure in office, currently <140/90 on no medications. No changes will be made at this time.  Results reviewed and written information provided.   Total time in face-to-face counseling 5 minutes.   F/U Clinic Visit with Dr. Wynetta Emery in January 2019  Patient seen with Thornton Park, PharmD Candidate

## 2017-08-24 ENCOUNTER — Telehealth: Payer: Self-pay | Admitting: Internal Medicine

## 2017-08-24 DIAGNOSIS — G40909 Epilepsy, unspecified, not intractable, without status epilepticus: Secondary | ICD-10-CM

## 2017-08-24 NOTE — Telephone Encounter (Signed)
Dr. Gracy Bruins office called stating they faxed [place] 2 times requesting a referral for patient to follow up with Dr. Jannifer Franklin (Neurologist) for seizures ,but referral has to come from PCP

## 2017-08-24 NOTE — Telephone Encounter (Signed)
Dr.Johnson have you received any information regarding this

## 2017-08-25 NOTE — Telephone Encounter (Signed)
Dr. Jannifer Franklin is with [facility].

## 2017-08-25 NOTE — Telephone Encounter (Signed)
Referral submitted to [facility]

## 2017-10-04 ENCOUNTER — Ambulatory Visit: Payer: Medicaid Other | Admitting: Neurology

## 2017-10-04 ENCOUNTER — Encounter: Payer: Self-pay | Admitting: Neurology

## 2017-10-04 VITALS — BP 137/91 | HR 81 | Ht 75.0 in | Wt 253.0 lb

## 2017-10-04 DIAGNOSIS — D329 Benign neoplasm of meninges, unspecified: Secondary | ICD-10-CM | POA: Diagnosis not present

## 2017-10-04 DIAGNOSIS — R569 Unspecified convulsions: Secondary | ICD-10-CM | POA: Diagnosis not present

## 2017-10-04 MED ORDER — LEVETIRACETAM 750 MG PO TABS: 750.0000 mg | ORAL_TABLET | Freq: Two times a day (BID) | ORAL | 5 refills | Status: DC

## 2017-10-04 NOTE — Progress Notes (Signed)
Reason for visit: Seizures  Referring physician: Dr. Anne Ng is a 51 y.o. male  History of present illness:  Jeremy Soto is a 51 year old right-handed black male with a history of a right frontal meningioma that presented with a seizure.  The patient underwent surgery by Dr. Ellene Route on 27 January 2016.  The patient has been placed on Keppra, he has had 2 different types of seizures.  One seizure episode is associated with feeling like he is in a daydreaming event, he has difficulty responding during these episodes.  The patient may also have generalized seizures associated with jerking and tongue biting and urinary incontinence.  The patient last had a seizure 2 months ago, he believes he may have missed a dose of his medications.  The patient does not operate a motor vehicle.  The seizure medications have been supplied through Dr. Ellene Route.  The patient comes to this office for management of his seizures.  The patient reports no numbness or weakness of the face, arms, or legs.  He denies any balance problems, he does report some urgency of the bowels and bladder.  He reports some slight numbness of the big toe and second toes on both feet.  The patient tolerates the Keppra fairly well.  He lives with a friend currently, he does not work.  Past Medical History:  Diagnosis Date  . Anxiety   . Asthma    as a child  . Brain tumor ([place])   . Constipation   . Depression   . GERD (gastroesophageal reflux disease)   . Head injury due to trauma    dropped chain saw on head- had staples  . Peptic ulcer   . Seizure ([place])   . Shortness of breath dyspnea    with exertion   . Sickle cell trait [facility])     Past Surgical History:  Procedure Laterality Date  . CLOSED REDUCTION NASAL FRACTURE N/A 06/04/2014   Procedure: CLOSED REDUCTION NASAL FRACTURE;  Surgeon: Ascencion Dike, MD;  Location: [facility];  Service: ENT;  Laterality: N/A;  . CRANIOTOMY Right 01/27/2016   Procedure:  RIGHT FRONTAL CRANIOTOMY FOR MENINGIOMA;  Surgeon: Kristeen Miss, MD;  Location: [facility];  Service: Neurosurgery;  Laterality: Right;  RIGHT FRONTAL CRANIOTOMY FOR MENINGIOMA  . DIAGNOSTIC LAPAROSCOPY     - stomach  . ESOPHAGOGASTRODUODENOSCOPY ENDOSCOPY      Family History  Problem Relation Age of Onset  . Diabetes Maternal Grandmother   . Diabetes Paternal Grandfather   . Hypertension Paternal Grandfather   . Hypertension Father   . Diabetes Father   . Sickle cell trait Father   . Hypertension Mother   . Diabetes Mother   . Sickle cell trait Mother   . Colon cancer Neg Hx   . Rectal cancer Neg Hx   . Stomach cancer Neg Hx   . Esophageal cancer Neg Hx     Social history:  reports that he has been smoking cigarettes.  He has a 40.00 pack-year smoking history. he has never used smokeless tobacco. He reports that he drinks about 0.6 oz of alcohol per week. He reports that he does not use drugs.  Medications:  Prior to Admission medications   Medication Sig Start Date End Date Taking? Authorizing Provider  acetaminophen-codeine (TYLENOL #3) 300-30 MG tablet Take 2 tablets by mouth at bedtime as needed for moderate pain. 08/04/17  Yes Ladell Pier, MD  atorvastatin (LIPITOR) 10 MG  tablet Take 1 tablet (10 mg total) by mouth daily. 08/04/17  Yes Ladell Pier, MD  diclofenac sodium (VOLTAREN) 1 % GEL Apply 2 g topically 4 (four) times daily. 04/01/17  Yes Ladell Pier, MD  escitalopram (LEXAPRO) 10 MG tablet Take 1 tablet (10 mg total) by mouth daily. . 08/04/17  Yes Ladell Pier, MD  levETIRAcetam (KEPPRA) 500 MG tablet Take 1 tablet (500 mg total) by mouth 2 (two) times daily. 11/17/16  Yes Delo, Nathaneil Canary, MD  nicotine (NICODERM CQ - DOSED IN MG/24 HR) 7 mg/24hr patch Place 1 patch (7 mg total) onto the skin daily. Patient not taking: Reported on 05/26/2017 04/01/17   Ladell Pier, MD     No Known Allergies  ROS:  Out of a complete 14 system review of  symptoms, the patient complains only of the following symptoms, and all other reviewed systems are negative.  Weight gain Hearing loss, ringing in the ears Itching Blurred vision, double vision Shortness of breath, wheezing Feeling cold, increased thirst Joint pain, aching muscles Skin sensitivity Memory loss, confusion, headache, weakness, slurred speech, dizziness, seizures Depression, anxiety, none of sleep, decreased energy, change in appetite, racing thoughts Sleepiness, snoring  Blood pressure (!) 137/91, pulse 81, height 6\' 3"  (1.905 m), weight 253 lb (114.8 kg).  Physical Exam  General: The patient is alert and cooperative at the time of the examination.  Eyes: Pupils are equal, round, and reactive to light. Discs are flat bilaterally.  Neck: The neck is supple, no carotid bruits are noted.  Respiratory: The respiratory examination is clear.  Cardiovascular: The cardiovascular examination reveals a regular rate and rhythm, no obvious murmurs or rubs are noted.  Skin: Extremities are without significant edema.  Neurologic Exam  Mental status: The patient is alert and oriented x 3 at the time of the examination. The patient has apparent normal recent and remote memory, with an apparently normal attention span and concentration ability.  Cranial nerves: Facial symmetry is present. There is good sensation of the face to pinprick and soft touch on the left, decreased on the right.  The patient splits the midline with vibration sensation, decreased on the right. The strength of the facial muscles and the muscles to head turning and shoulder shrug are normal bilaterally. Speech is well enunciated, no aphasia or dysarthria is noted. Extraocular movements are full. Visual fields are full. The tongue is midline, and the patient has symmetric elevation of the soft palate. No obvious hearing deficits are noted.  Motor: The motor testing reveals 5 over 5 strength of all 4 extremities.  Good symmetric motor tone is noted throughout.  Sensory: Sensory testing is notable for decreased pinprick, soft touch, vibration sensation on the right arm and leg, position sense is intact in all 4 extremities. No evidence of extinction is noted.  Coordination: Cerebellar testing reveals good finger-nose-finger and heel-to-shin bilaterally.  Gait and station: Gait is normal. Tandem gait is normal. Romberg is negative. No drift is seen.  Reflexes: Deep tendon reflexes are symmetric, but are depressed bilaterally. Toes are downgoing bilaterally.   MRI brain/ MRA head 01/17/17:  IMPRESSION: MRI HEAD IMPRESSION:  1. 3.0 x 2.1 x 2.7 cm solidly enhancing mass within the parasagittal right frontal lobe. Although somewhat difficult to discern, this is favored to be extra-axial in location, and most consistent with a meningioma. Associated vasogenic edema within the adjacent right frontal lobe without significant mass effect. 2. Mild chronic microvascular ischemic changes.  MRA HEAD IMPRESSION:  Normal intracranial MRA.  * MRI scan images were reviewed online. I agree with the written report.   EEG 01/19/16:  Impression: This is a normal routine EEG of the awake and asleep states, with activating procedures.  A normal study does not rule out the possibility of a seizure disorder in this patient.    Assessment/Plan:  1.  Right frontal meningioma, status post resection  2.  Intractable seizures  3.  Nonorganic sensory examination  The patient claims to have ongoing seizures at this time, we will increase Keppra taking 750 mg twice daily.  He will call if any seizure recurrence is noted.  The patient will follow-up in 4 months.  He is not operating motor vehicle.  Jeremy Alexanders MD 10/04/2017 2:07 PM  [facility] [address] [place], [place] [address]  Phone [phone] Fax [phone]

## 2017-10-04 NOTE — Patient Instructions (Signed)
   We will increase the Keppra to 750 mg twice a day.

## 2017-11-04 ENCOUNTER — Ambulatory Visit: Payer: Medicaid Other | Admitting: Internal Medicine

## 2017-11-11 ENCOUNTER — Ambulatory Visit: Payer: Medicaid Other | Admitting: Internal Medicine

## 2018-01-18 ENCOUNTER — Ambulatory Visit: Payer: Medicaid Other | Admitting: Family Medicine

## 2018-01-25 ENCOUNTER — Ambulatory Visit
Admission: RE | Admit: 2018-01-25 | Discharge: 2018-01-25 | Disposition: A | Discharge status: Home / Self Care | Payer: Medicaid Other | Source: Ambulatory Visit | Attending: Family Medicine | Admitting: Family Medicine

## 2018-01-25 ENCOUNTER — Ambulatory Visit: Payer: Medicaid Other | Admitting: Family Medicine

## 2018-01-25 ENCOUNTER — Ambulatory Visit: Payer: Self-pay

## 2018-01-25 ENCOUNTER — Encounter: Payer: Self-pay | Admitting: Family Medicine

## 2018-01-25 VITALS — BP 136/85 | HR 84 | Ht 75.0 in | Wt 240.0 lb

## 2018-01-25 DIAGNOSIS — M25511 Pain in right shoulder: Secondary | ICD-10-CM

## 2018-01-25 DIAGNOSIS — G8929 Other chronic pain: Secondary | ICD-10-CM | POA: Diagnosis not present

## 2018-01-25 MED ORDER — NITROGLYCERIN 0.2 MG/HR TD PT24: MEDICATED_PATCH | TRANSDERMAL | 1 refills | Status: DC

## 2018-01-25 NOTE — Patient Instructions (Signed)
You have an partial interstitial tear of your supraspinatus (rotator cuff) and bursitis - this should do well with conservative treatment. Get x-rays of your shoulder - we will call you with the results. Assuming these look ok we will order physical therapy - start this and do home exercises on days you don't go to therapy. Try to avoid painful activities (overhead activities, lifting with extended arm) as much as possible. Aleve 2 tabs twice a day with food OR ibuprofen 3 tabs three times a day with food for pain and inflammation. Nitro 1/4th patch to affected shoulder, change daily. Can take tylenol in addition to this. If not improving at follow-up we will consider further imaging, injection. Follow up with me in 6 weeks.

## 2018-01-29 ENCOUNTER — Encounter: Payer: Self-pay | Admitting: Family Medicine

## 2018-01-29 DIAGNOSIS — M25511 Pain in right shoulder: Secondary | ICD-10-CM | POA: Insufficient documentation

## 2018-01-29 NOTE — Assessment & Plan Note (Signed)
2/2 partial supraspinatus tear with subacromial bursitis.  Independently reviewed radiographs and no evidence bony bankart or other abnormalities.  Start physical therapy and home exercises.  Aleve or ibuprofen.  Nitro patches.  Consider injection if not improving.  F/u in 6 weeks.

## 2018-01-29 NOTE — Progress Notes (Signed)
PCP and consultation requested by: Ladell Pier, MD  Subjective:   HPI: Patient is a 52 y.o. male here for right shoulder pain.  Patient reports about 6 months ago he had a seizure. About 2 days later he started to get pain anterior right shoulder radiating to neck and back. Pain is sharp, worse with motion especially lifting things. Cannot lie on back or right shoulder. No prior right shoulder issues. No skin changes, numbness.  Past Medical History:  Diagnosis Date  . Anxiety   . Asthma    as a child  . Brain tumor ([place])   . Constipation   . Depression   . GERD (gastroesophageal reflux disease)   . Head injury due to trauma    dropped chain saw on head- had staples  . Peptic ulcer   . Seizure ([place])   . Shortness of breath dyspnea    with exertion   . Sickle cell trait ([place])     Current Outpatient Medications on File Prior to Visit  Medication Sig Dispense Refill  . acetaminophen-codeine (TYLENOL #3) 300-30 MG tablet Take 2 tablets by mouth at bedtime as needed for moderate pain. 30 tablet 0  . atorvastatin (LIPITOR) 10 MG tablet Take 1 tablet (10 mg total) by mouth daily. 90 tablet 3  . diclofenac sodium (VOLTAREN) 1 % GEL Apply 2 g topically 4 (four) times daily. 100 g 0  . escitalopram (LEXAPRO) 10 MG tablet Take 1 tablet (10 mg total) by mouth daily. . 30 tablet 6  . levETIRAcetam (KEPPRA) 750 MG tablet Take 1 tablet (750 mg total) by mouth 2 (two) times daily. 60 tablet 5  . nicotine (NICODERM CQ - DOSED IN MG/24 HR) 7 mg/24hr patch Place 1 patch (7 mg total) onto the skin daily. (Patient not taking: Reported on 05/26/2017) 30 patch 1   No current facility-administered medications on file prior to visit.     Past Surgical History:  Procedure Laterality Date  . CLOSED REDUCTION NASAL FRACTURE N/A 06/04/2014   Procedure: CLOSED REDUCTION NASAL FRACTURE;  Surgeon: Ascencion Dike, MD;  Location: [facility];  Service: ENT;  Laterality: N/A;  . CRANIOTOMY  Right 01/27/2016   Procedure: RIGHT FRONTAL CRANIOTOMY FOR MENINGIOMA;  Surgeon: Kristeen Miss, MD;  Location: [facility];  Service: Neurosurgery;  Laterality: Right;  RIGHT FRONTAL CRANIOTOMY FOR MENINGIOMA  . DIAGNOSTIC LAPAROSCOPY     - stomach  . ESOPHAGOGASTRODUODENOSCOPY ENDOSCOPY      No Known Allergies  Social History   Socioeconomic History  . Marital status: Single    Spouse name: Not on file  . Number of children: 2  . Years of education: 66  . Highest education level: Not on file  Occupational History  . Occupation: Disability    Employer: [employer]  . Financial resource strain: Not on file  . Food insecurity:    Worry: Not on file    Inability: Not on file  . Transportation needs:    Medical: Not on file    Non-medical: Not on file  Tobacco Use  . Smoking status: Current Every Day Smoker    Packs/day: 2.00    Years: 20.00    Pack years: 40.00    Types: Cigarettes  . Smokeless tobacco: Never Used  Substance and Sexual Activity  . Alcohol use: Yes    Alcohol/week: 0.6 oz    Types: 1 Cans of beer per week  . Drug use: No  Comment: denies  . Sexual activity: Yes  Lifestyle  . Physical activity:    Days per week: Not on file    Minutes per session: Not on file  . Stress: Not on file  Relationships  . Social connections:    Talks on phone: Not on file    Gets together: Not on file    Attends religious service: Not on file    Active member of club or organization: Not on file    Attends meetings of clubs or organizations: Not on file    Relationship status: Not on file  . Intimate partner violence:    Fear of current or ex partner: Not on file    Emotionally abused: Not on file    Physically abused: Not on file    Forced sexual activity: Not on file  Other Topics Concern  . Not on file  Social History Narrative   Lives with girlfriend   Caffeine use: daily   Right handed     Family History  Problem Relation Age of Onset  .  Diabetes Maternal Grandmother   . Diabetes Paternal Grandfather   . Hypertension Paternal Grandfather   . Hypertension Father   . Diabetes Father   . Sickle cell trait Father   . Hypertension Mother   . Diabetes Mother   . Sickle cell trait Mother   . Colon cancer Neg Hx   . Rectal cancer Neg Hx   . Stomach cancer Neg Hx   . Esophageal cancer Neg Hx     BP 136/85   Pulse 84   Ht 6\' 3"  (1.905 m)   Wt 240 lb (108.9 kg)   BMI 30.00 kg/m   Review of Systems: See HPI above.     Objective:  Physical Exam:  Gen: NAD, comfortable in exam room  Right shoulder: No swelling, ecchymoses.  No gross deformity. No TTP. FROM with painful arc. Positive Hawkins, Neers. Negative Yergasons. Strength 5/5 with empty can and resisted internal/external rotation.  Pain empty can. Negative apprehension. NV intact distally.  Left shoulder: No swelling, ecchymoses.  No gross deformity. No TTP. FROM. Strength 5/5 with empty can and resisted internal/external rotation. NV intact distally.   MSK u/s right shoulder: Biceps tendon intact on long and trans views.  Focal tenosynovitis of biceps tendon.  Subscapularis, infraspinatus intact.  AC joint with mild arthropathy.  Supraspinatus with partial interstitial tear.  Subacromial bursitis noted.  Assessment & Plan:  1. Right shoulder pain - 2/2 partial supraspinatus tear with subacromial bursitis.  Independently reviewed radiographs and no evidence bony bankart or other abnormalities.  Start physical therapy and home exercises.  Aleve or ibuprofen.  Nitro patches.  Consider injection if not improving.  F/u in 6 weeks.

## 2018-02-01 ENCOUNTER — Ambulatory Visit: Payer: Medicaid Other | Attending: Physical Therapy | Admitting: Physical Therapy

## 2018-02-02 ENCOUNTER — Ambulatory Visit: Payer: Medicaid Other | Admitting: Adult Health

## 2018-02-06 ENCOUNTER — Other Ambulatory Visit: Payer: Self-pay

## 2018-02-06 ENCOUNTER — Emergency Department (HOSPITAL_COMMUNITY)
Admission: EM | Admit: 2018-02-06 | Discharge: 2018-02-06 | Disposition: A | Discharge status: Home / Self Care | Payer: Medicaid Other | Attending: Emergency Medicine | Admitting: Emergency Medicine

## 2018-02-06 ENCOUNTER — Encounter: Payer: Self-pay | Admitting: Adult Health

## 2018-02-06 ENCOUNTER — Ambulatory Visit: Payer: Medicaid Other | Admitting: Adult Health

## 2018-02-06 VITALS — BP 137/87 | HR 79 | Ht 75.0 in | Wt 253.0 lb

## 2018-02-06 DIAGNOSIS — F17210 Nicotine dependence, cigarettes, uncomplicated: Secondary | ICD-10-CM | POA: Diagnosis not present

## 2018-02-06 DIAGNOSIS — Z86011 Personal history of benign neoplasm of the brain: Secondary | ICD-10-CM | POA: Insufficient documentation

## 2018-02-06 DIAGNOSIS — R569 Unspecified convulsions: Secondary | ICD-10-CM | POA: Diagnosis not present

## 2018-02-06 DIAGNOSIS — Z23 Encounter for immunization: Secondary | ICD-10-CM | POA: Insufficient documentation

## 2018-02-06 DIAGNOSIS — Y9289 Other specified places as the place of occurrence of the external cause: Secondary | ICD-10-CM | POA: Diagnosis not present

## 2018-02-06 DIAGNOSIS — D329 Benign neoplasm of meninges, unspecified: Secondary | ICD-10-CM

## 2018-02-06 DIAGNOSIS — Z79899 Other long term (current) drug therapy: Secondary | ICD-10-CM | POA: Insufficient documentation

## 2018-02-06 DIAGNOSIS — W540XXA Bitten by dog, initial encounter: Secondary | ICD-10-CM | POA: Insufficient documentation

## 2018-02-06 DIAGNOSIS — Z2914 Encounter for prophylactic rabies immune globin: Secondary | ICD-10-CM | POA: Insufficient documentation

## 2018-02-06 DIAGNOSIS — Y999 Unspecified external cause status: Secondary | ICD-10-CM | POA: Diagnosis not present

## 2018-02-06 DIAGNOSIS — S01511A Laceration without foreign body of lip, initial encounter: Secondary | ICD-10-CM | POA: Insufficient documentation

## 2018-02-06 DIAGNOSIS — I129 Hypertensive chronic kidney disease with stage 1 through stage 4 chronic kidney disease, or unspecified chronic kidney disease: Secondary | ICD-10-CM | POA: Insufficient documentation

## 2018-02-06 DIAGNOSIS — N182 Chronic kidney disease, stage 2 (mild): Secondary | ICD-10-CM | POA: Insufficient documentation

## 2018-02-06 DIAGNOSIS — Y9389 Activity, other specified: Secondary | ICD-10-CM | POA: Insufficient documentation

## 2018-02-06 DIAGNOSIS — J45909 Unspecified asthma, uncomplicated: Secondary | ICD-10-CM | POA: Diagnosis not present

## 2018-02-06 DIAGNOSIS — S0185XA Open bite of other part of head, initial encounter: Secondary | ICD-10-CM

## 2018-02-06 DIAGNOSIS — S0993XA Unspecified injury of face, initial encounter: Secondary | ICD-10-CM | POA: Diagnosis present

## 2018-02-06 MED ORDER — LEVETIRACETAM 750 MG PO TABS: 750.0000 mg | ORAL_TABLET | Freq: Two times a day (BID) | ORAL | 11 refills | Status: DC

## 2018-02-06 MED ORDER — LIDOCAINE-EPINEPHRINE (PF) 2 %-1:200000 IJ SOLN
20.0000 mL | Freq: Once | INTRAMUSCULAR | Status: AC
  Administered 2018-02-06: 20 mL
  Filled 2018-02-06: qty 20

## 2018-02-06 MED ORDER — TETANUS-DIPHTH-ACELL PERTUSSIS 5-2.5-18.5 LF-MCG/0.5 IM SUSP
0.5000 mL | Freq: Once | INTRAMUSCULAR | Status: AC
  Administered 2018-02-06: 0.5 mL via INTRAMUSCULAR
  Filled 2018-02-06: qty 0.5

## 2018-02-06 MED ORDER — OXYCODONE-ACETAMINOPHEN 5-325 MG PO TABS: 1.0000 | ORAL_TABLET | ORAL | 0 refills | Status: DC | PRN

## 2018-02-06 MED ORDER — OXYCODONE-ACETAMINOPHEN 5-325 MG PO TABS
2.0000 | ORAL_TABLET | Freq: Once | ORAL | Status: AC
  Administered 2018-02-06: 2 via ORAL
  Filled 2018-02-06: qty 2

## 2018-02-06 MED ORDER — RABIES VACCINE, PCEC IM SUSR
1.0000 mL | Freq: Once | INTRAMUSCULAR | Status: AC
  Administered 2018-02-06: 1 mL via INTRAMUSCULAR
  Filled 2018-02-06: qty 1

## 2018-02-06 MED ORDER — RABIES IMMUNE GLOBULIN 150 UNIT/ML IM INJ
20.0000 [IU]/kg | INJECTION | Freq: Once | INTRAMUSCULAR | Status: AC
  Administered 2018-02-06: 2325 [IU] via INTRAMUSCULAR
  Filled 2018-02-06: qty 15.5

## 2018-02-06 MED ORDER — CEPHALEXIN 500 MG PO CAPS: 500.0000 mg | ORAL_CAPSULE | Freq: Three times a day (TID) | ORAL | 0 refills | Status: DC

## 2018-02-06 NOTE — Discharge Instructions (Addendum)
Contact a health care provider if: You have increasing redness, swelling, or pain at the site of your wound. You have a general feeling of sickness (malaise). You feel nauseous or you vomit. You have pain that does not get better. Get help right away if: You have a red streak extending away from your wound. You have fluid, blood, or pus coming from your wound. You have a fever or chills. You have trouble moving your injured area. You have numbness or tingling extending beyond the wound.

## 2018-02-06 NOTE — ED Notes (Signed)
Called Pt's brother with permission of Pt. Pt's brother did not answer. Left message for brother Shadd Dunstan to give [place] a call back so we can obtain vaccination records of dog.

## 2018-02-06 NOTE — Consult Note (Signed)
ENT CONSULT:  Reason for Consult: Complex Lip Laceration 4 cm Referring Physician:  EDP  Jeremy Soto is an 52 y.o. male.  HPI: Patient [facility] to [facility] emergency department after suffering a dog bite injury to the face.  He was bitten by a family dog with extensive laceration involving the right lower lip.  Patient evaluated by the ER staff and ENT service consulted.  Past Medical History:  Diagnosis Date  . Anxiety   . Asthma    as a child  . Brain tumor ([place])   . Constipation   . Depression   . GERD (gastroesophageal reflux disease)   . Head injury due to trauma    dropped chain saw on head- had staples  . Peptic ulcer   . Seizure ([place])   . Shortness of breath dyspnea    with exertion   . Sickle cell trait [facility])     Past Surgical History:  Procedure Laterality Date  . CLOSED REDUCTION NASAL FRACTURE N/A 06/04/2014   Procedure: CLOSED REDUCTION NASAL FRACTURE;  Surgeon: Ascencion Dike, MD;  Location: [place];  Service: ENT;  Laterality: N/A;  . CRANIOTOMY Right 01/27/2016   Procedure: RIGHT FRONTAL CRANIOTOMY FOR MENINGIOMA;  Surgeon: Kristeen Miss, MD;  Location: [place] [facility];  Service: Neurosurgery;  Laterality: Right;  RIGHT FRONTAL CRANIOTOMY FOR MENINGIOMA  . DIAGNOSTIC LAPAROSCOPY     - stomach  . ESOPHAGOGASTRODUODENOSCOPY ENDOSCOPY      Family History  Problem Relation Age of Onset  . Diabetes Maternal Grandmother   . Diabetes Paternal Grandfather   . Hypertension Paternal Grandfather   . Hypertension Father   . Diabetes Father   . Sickle cell trait Father   . Hypertension Mother   . Diabetes Mother   . Sickle cell trait Mother   . Colon cancer Neg Hx   . Rectal cancer Neg Hx   . Stomach cancer Neg Hx   . Esophageal cancer Neg Hx     Social History:  reports that he has been smoking cigarettes.  He has a 40.00 pack-year smoking history. He has never used smokeless tobacco. He reports that he drinks about 0.6 oz of alcohol per  week. He reports that he does not use drugs.  Allergies: No Known Allergies  Medications: I have reviewed the patient's current medications.  No results found for this or any previous visit (from the past 48 hour(s)).  No results found.  ROS:ROS  Blood pressure (!) 142/98, pulse 89, temperature 98.5 F (36.9 C), temperature source Oral, resp. rate 20, SpO2 98 %.  PHYSICAL EXAM: Mental status - alert, oriented to person, place, and time Mouth - mucous membranes moist, pharynx normal without lesions and Extensive laceration involving the right lower lip with partial avulsion, total length 4 cm Neck - supple, no significant adenopathy  Studies Reviewed: None    Procedure: Multilayer repair complex lower lip laceration -4 cm  Patient evaluated in the emergency department, risks and benefits of debridement and closure of complex lip laceration were discussed in detail, patient understood and agreed with procedure under local anesthetic in the emergency department.  Anesthesia: 2 cc of 1% lidocaine 1 100,000 dilution epinephrine  Procedure: With the patient adequately anesthetized after injection of 2 cc of local anesthetic is extensive laceration was debrided and cleaned with hydrogen peroxide solution.  The patient was then prepped and draped with Betadine solution.  The patient's lower lip laceration was then reconstructed in multiple  layers beginning with interrupted 5-0 Vicryl suture to reapproximate the entire right lower lip muscle.  The mucosal laceration was closed with interrupted 4-0 gut suture.  The portion of the laceration was across the vermilion border and extended on the skin of the lower lip/chin was closed with running interlocked 5-0 Ethilon suture.  Patient tolerated the procedure well without complication or difficulty.  Plan discharged home in stable condition.  Delsa Bern, MD    Assessment/Plan: Patient presents to the was a long hospital emergency  department with extensive complex laceration of the lower lip.  The wound was cleaned and closed in the emergency department without complication or difficulty.  Recommend discharge to home with laceration precautions.  The ER physicians will prescribe oral antibiotics and appropriate pain medication.  Topical bacitracin ointment to the incision line.  The patient will avoid any additional trauma and follow-up in my office in 10 days for reevaluation and suture removal.  Jeremy Soto 02/06/2018, 7:11 PM

## 2018-02-06 NOTE — Progress Notes (Signed)
I have read the note, and I agree with the clinical assessment and plan.  Charles K Willis   

## 2018-02-06 NOTE — ED Notes (Signed)
Delay in vitals. Pt is getting sutures. Will obtain after procedure.

## 2018-02-06 NOTE — ED Provider Notes (Signed)
[facility] DEPT Provider Note   CSN: [id number] Arrival date & time: 02/06/18  1548     History   Chief Complaint Chief Complaint  Patient presents with  . Animal Bite    HPI Jeremy Soto is a 52 y.o. male.  With a past medical history of traumatic head injury, brain tumor who presents the emergency department for evaluation of dog bite to the lip.  Patient states that he was drinking alcohol this morning went over to his brother's house and was playing with his pit bull.  He states that he leaned down to kill the dog a case and it jumped up and grabbed his lower lip with its teeth.  This occurred around 1:15 PM today.  He is unsure of his last tetanus vaccination.  He is unsure if the dog has had his rabies vaccinations.  HPI  Past Medical History:  Diagnosis Date  . Anxiety   . Asthma    as a child  . Brain tumor ([place])   . Constipation   . Depression   . GERD (gastroesophageal reflux disease)   . Head injury due to trauma    dropped chain saw on head- had staples  . Peptic ulcer   . Seizure ([place])   . Shortness of breath dyspnea    with exertion   . Sickle cell trait [facility])     Patient Active Problem List   Diagnosis Date Noted  . Right shoulder pain 01/29/2018  . Hyperlipidemia 08/05/2017  . Anxiety and depression 08/05/2017  . Hypertension 08/05/2017  . Diabetes mellitus screening 04/01/2017  . S/P craniotomy 01/27/2016  . Benign meningioma ([place]) 01/27/2016  . CKD (chronic kidney disease) stage 2, GFR 60-89 ml/min 01/18/2016  . Tobacco abuse 01/18/2016  . Seizures ([place])   . GERD (gastroesophageal reflux disease) 04/13/2013    Past Surgical History:  Procedure Laterality Date  . CLOSED REDUCTION NASAL FRACTURE N/A 06/04/2014   Procedure: CLOSED REDUCTION NASAL FRACTURE;  Surgeon: Ascencion Dike, MD;  Location: [place];  Service: ENT;  Laterality: N/A;  . CRANIOTOMY Right 01/27/2016   Procedure: RIGHT FRONTAL  CRANIOTOMY FOR MENINGIOMA;  Surgeon: Kristeen Miss, MD;  Location: [place] [facility];  Service: Neurosurgery;  Laterality: Right;  RIGHT FRONTAL CRANIOTOMY FOR MENINGIOMA  . DIAGNOSTIC LAPAROSCOPY     - stomach  . ESOPHAGOGASTRODUODENOSCOPY ENDOSCOPY          Home Medications    Prior to Admission medications   Medication Sig Start Date End Date Taking? Authorizing Provider  acetaminophen-codeine (TYLENOL #3) 300-30 MG tablet Take 2 tablets by mouth at bedtime as needed for moderate pain. Patient not taking: Reported on 02/06/2018 08/04/17   Ladell Pier, MD  atorvastatin (LIPITOR) 10 MG tablet Take 1 tablet (10 mg total) by mouth daily. 08/04/17   Ladell Pier, MD  diclofenac sodium (VOLTAREN) 1 % GEL Apply 2 g topically 4 (four) times daily. 04/01/17   Ladell Pier, MD  escitalopram (LEXAPRO) 10 MG tablet Take 1 tablet (10 mg total) by mouth daily. . 08/04/17   Ladell Pier, MD  levETIRAcetam (KEPPRA) 750 MG tablet Take 1 tablet (750 mg total) by mouth 2 (two) times daily. 02/06/18   Ward Givens, NP  nicotine (NICODERM CQ - DOSED IN MG/24 HR) 7 mg/24hr patch Place 1 patch (7 mg total) onto the skin daily. 04/01/17   Ladell Pier, MD  nitroGLYCERIN (NITRODUR - DOSED IN MG/24 HR) 0.2  mg/hr patch Use 1/4 patch daily to the affected area. 01/25/18   Dene Gentry, MD    Family History Family History  Problem Relation Age of Onset  . Diabetes Maternal Grandmother   . Diabetes Paternal Grandfather   . Hypertension Paternal Grandfather   . Hypertension Father   . Diabetes Father   . Sickle cell trait Father   . Hypertension Mother   . Diabetes Mother   . Sickle cell trait Mother   . Colon cancer Neg Hx   . Rectal cancer Neg Hx   . Stomach cancer Neg Hx   . Esophageal cancer Neg Hx     Social History Social History   Tobacco Use  . Smoking status: Current Every Day Smoker    Packs/day: 2.00    Years: 20.00    Pack years: 40.00    Types: Cigarettes  .  Smokeless tobacco: Never Used  Substance Use Topics  . Alcohol use: Yes    Alcohol/week: 0.6 oz    Types: 1 Cans of beer per week  . Drug use: No    Comment: denies     Allergies   Patient has no known allergies.   Review of Systems Review of Systems Ten systems reviewed and are negative for acute change, except as noted in the HPI.    Physical Exam Updated Vital Signs BP (!) 142/98 (BP Location: Left Arm)   Pulse 89   Temp 98.5 F (36.9 C) (Oral)   Resp 20   SpO2 98%   Physical Exam  Constitutional: He appears well-developed and well-nourished. No distress.  HENT:  Head: Normocephalic and atraumatic.  Laceration and avulsion of tissue of the lower lip   Eyes: Pupils are equal, round, and reactive to light. Conjunctivae and EOM are normal. No scleral icterus.  Neck: Normal range of motion. Neck supple.  Cardiovascular: Normal rate, regular rhythm and normal heart sounds.  Pulmonary/Chest: Effort normal and breath sounds normal. No respiratory distress.  Abdominal: Soft. There is no tenderness.  Musculoskeletal: He exhibits no edema.  Neurological: He is alert.  Skin: Skin is warm and dry. He is not diaphoretic.  Psychiatric: His behavior is normal.  Nursing note and vitals reviewed.         ED Treatments / Results  Labs (all labs ordered are listed, but only abnormal results are displayed) Labs Reviewed - No data to display  EKG None  Radiology No results found.  Procedures Procedures (including critical care time)  Medications Ordered in ED Medications  Tdap (BOOSTRIX) injection 0.5 mL (0.5 mLs Intramuscular Given 02/06/18 1713)     Initial Impression / Assessment and Plan / ED Course  I have reviewed the triage vital signs and the nursing notes.  Pertinent labs & imaging results that were available during my care of the patient were reviewed by me and considered in my medical decision making (see chart for details).     Patient laceration  repaired by Dr. Wilburn Cornelia.  He he has gotten rabies vaccine and immunoglobulin and will follow up for repeat vaccinations at the urgent care.  Patient is also advised to follow-up with Dr. Wilburn Cornelia in 10 days for suture removal.  He appears otherwise ready for discharge.  I have discussed return precautions.  Final Clinical Impressions(s) / ED Diagnoses   Final diagnoses:  Dog bite of face, initial encounter    ED Discharge Orders    None       Margarita Mail, PA-C 02/06/18 2329  Fatima Blank, MD 02/07/18 0021

## 2018-02-06 NOTE — ED Triage Notes (Signed)
Per Pt: Pt reports that his brother's dog bit him. The dog is not an inside dog and pt is unsure if the dog is up to date on shots.

## 2018-02-06 NOTE — Progress Notes (Signed)
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  Patient returns today for follow-up.  At the last visit Keppra was increased to 750 mg twice a day.  He reports that he has not had any additional seizure events.  He lives  at home with a friend.  Able to complete all ADLs independently.  He reports that he is short tempered at times.  He reports that he has some issues with depression and Lexapro.  Reports that he likes Keppra and would rather not change the medication at this time.  He returns today for an evaluation.  HISTORY 10/04/17: Jeremy Soto is a 52 year old right-handed black male with a history of a right frontal meningioma that presented with a seizure.  The patient underwent surgery by Dr. Ellene Route on 27 January 2016.  The patient has been placed on Keppra, he has had 2 different types of seizures.  One seizure episode is associated with feeling like he is in a daydreaming event, he has difficulty responding during these episodes.  The patient may also have generalized seizures associated with jerking and tongue biting and urinary incontinence.  The patient last had a seizure 2 months ago, he believes he may have missed a dose of his medications.  The patient does not operate a motor vehicle.  The seizure medications have been supplied through Dr. Ellene Route.  The patient comes to this office for management of his seizures.  The patient reports no numbness or weakness of the face, arms, or legs.  He denies any balance problems, he does report some urgency of the bowels and bladder.  He reports some slight numbness of the big toe and second toes on both feet.  The patient tolerates the Keppra fairly well.  He lives with a friend currently, he does not work.   REVIEW OF SYSTEMS: Out of a complete 14 system review  of symptoms, the patient complains only of the following symptoms, and all other reviewed systems are negative.  Change, appetite change, chills, vision, loss of vision, shortness of breath, restless pain, memory loss, headache, weakness, confusion, depression  ALLERGIES: No Known Allergies  HOME MEDICATIONS: Outpatient Medications Prior to Visit  Medication Sig Dispense Refill  . atorvastatin (LIPITOR) 10 MG tablet Take 1 tablet (10 mg total) by mouth daily. 90 tablet 3  . diclofenac sodium (VOLTAREN) 1 % GEL Apply 2 g topically 4 (four) times daily. 100 g 0  . escitalopram (LEXAPRO) 10 MG tablet Take 1 tablet (10 mg total) by mouth daily. . 30 tablet 6  . levETIRAcetam (KEPPRA) 750 MG tablet Take 1 tablet (750 mg total) by mouth 2 (two) times daily. 60 tablet 5  . nicotine (NICODERM CQ - DOSED IN MG/24 HR) 7 mg/24hr patch Place 1 patch (7 mg total) onto the skin daily. 30 patch 1  . nitroGLYCERIN (NITRODUR - DOSED IN MG/24 HR) 0.2 mg/hr patch Use 1/4 patch daily to the affected area. 30 patch 1  . acetaminophen-codeine (TYLENOL #3) 300-30 MG tablet Take 2 tablets by mouth at bedtime as needed for moderate pain. (Patient not taking: Reported on 02/06/2018) 30 tablet 0   No facility-administered medications prior to visit.     PAST MEDICAL HISTORY: Past Medical History:  Diagnosis Date  . Anxiety   . Asthma    as  a child  . Brain tumor ([place])   . Constipation   . Depression   . GERD (gastroesophageal reflux disease)   . Head injury due to trauma    dropped chain saw on head- had staples  . Peptic ulcer   . Seizure ([place])   . Shortness of breath dyspnea    with exertion   . Sickle cell trait ([place])     PAST SURGICAL HISTORY: Past Surgical History:  Procedure Laterality Date  . CLOSED REDUCTION NASAL FRACTURE N/A 06/04/2014   Procedure: CLOSED REDUCTION NASAL FRACTURE;  Surgeon: Ascencion Dike, MD;  Location: [facility];  Service: ENT;  Laterality: N/A;  . CRANIOTOMY  Right 01/27/2016   Procedure: RIGHT FRONTAL CRANIOTOMY FOR MENINGIOMA;  Surgeon: Kristeen Miss, MD;  Location: [facility];  Service: Neurosurgery;  Laterality: Right;  RIGHT FRONTAL CRANIOTOMY FOR MENINGIOMA  . DIAGNOSTIC LAPAROSCOPY     - stomach  . ESOPHAGOGASTRODUODENOSCOPY ENDOSCOPY      FAMILY HISTORY: Family History  Problem Relation Age of Onset  . Diabetes Maternal Grandmother   . Diabetes Paternal Grandfather   . Hypertension Paternal Grandfather   . Hypertension Father   . Diabetes Father   . Sickle cell trait Father   . Hypertension Mother   . Diabetes Mother   . Sickle cell trait Mother   . Colon cancer Neg Hx   . Rectal cancer Neg Hx   . Stomach cancer Neg Hx   . Esophageal cancer Neg Hx     SOCIAL HISTORY: Social History   Socioeconomic History  . Marital status: Single    Spouse name: Not on file  . Number of children: 2  . Years of education: 53  . Highest education level: Not on file  Occupational History  . Occupation: Disability    Employer: [employer]  . Financial resource strain: Not on file  . Food insecurity:    Worry: Not on file    Inability: Not on file  . Transportation needs:    Medical: Not on file    Non-medical: Not on file  Tobacco Use  . Smoking status: Current Every Day Smoker    Packs/day: 2.00    Years: 20.00    Pack years: 40.00    Types: Cigarettes  . Smokeless tobacco: Never Used  Substance and Sexual Activity  . Alcohol use: Yes    Alcohol/week: 0.6 oz    Types: 1 Cans of beer per week  . Drug use: No    Comment: denies  . Sexual activity: Yes  Lifestyle  . Physical activity:    Days per week: Not on file    Minutes per session: Not on file  . Stress: Not on file  Relationships  . Social connections:    Talks on phone: Not on file    Gets together: Not on file    Attends religious service: Not on file    Active member of club or organization: Not on file    Attends meetings of clubs or  organizations: Not on file    Relationship status: Not on file  . Intimate partner violence:    Fear of current or ex partner: Not on file    Emotionally abused: Not on file    Physically abused: Not on file    Forced sexual activity: Not on file  Other Topics Concern  . Not on file  Social History Narrative   Lives with girlfriend   Caffeine  use: daily   Right handed       PHYSICAL EXAM  Vitals:   02/06/18 0827  BP: 137/87  Pulse: 79  Weight: 253 lb (114.8 kg)  Height: 6\' 3"  (1.905 m)   Body mass index is 31.62 kg/m.  Generalized: Well developed, in no acute distress   Neurological examination  Mentation: Alert oriented to time, place, history taking. Follows all commands speech and language fluent Cranial nerve II-XII: Pupils were equal round reactive to light. Extraocular movements were full, visual field were full on confrontational test. Facial sensation and strength were normal. Uvula tongue midline. Head turning and shoulder shrug  were normal and symmetric. Motor: The motor testing reveals 5 over 5 strength of all 4 extremities. Good symmetric motor tone is noted throughout.  Sensory: Sensory testing is intact to soft touch on all 4 extremities. No evidence of extinction is noted.  Coordination: Cerebellar testing reveals good finger-nose-finger and heel-to-shin bilaterally.  Gait and station: Gait is normal. Tandem gait is normal. Romberg is negative. No drift is seen.  Reflexes: Deep tendon reflexes are symmetric and normal bilaterally.   DIAGNOSTIC DATA (LABS, IMAGING, TESTING) - I reviewed patient records, labs, notes, testing and imaging myself where available.  Lab Results  Component Value Date   WBC 6.8 11/17/2016   HGB 13.0 11/17/2016   HCT 36.5 (L) 11/17/2016   MCV 89.2 11/17/2016   PLT 221 11/17/2016      Component Value Date/Time   NA 141 08/04/2017 1558   K 4.3 08/04/2017 1558   CL 103 08/04/2017 1558   CO2 24 08/04/2017 1558   GLUCOSE 107  (H) 08/04/2017 1558   GLUCOSE 90 11/17/2016 0435   BUN 13 08/04/2017 1558   CREATININE 1.52 (H) 08/04/2017 1558   CREATININE 1.50 (H) 04/13/2013 1231   CALCIUM 9.9 08/04/2017 1558   PROT 8.1 04/01/2017 1102   ALBUMIN 5.3 04/01/2017 1102   AST 17 04/01/2017 1102   ALT 13 04/01/2017 1102   ALKPHOS 89 04/01/2017 1102   BILITOT 0.9 04/01/2017 1102   GFRNONAA 52 (L) 08/04/2017 1558   GFRAA 60 08/04/2017 1558   Lab Results  Component Value Date   CHOL 274 (H) 01/19/2016   HDL 37 (L) 01/19/2016   LDLCALC 194 (H) 01/19/2016   TRIG 213 (H) 01/19/2016   CHOLHDL 7.4 01/19/2016   Lab Results  Component Value Date   HGBA1C 5.0 04/01/2017   No results found for: VITAMINB12 No results found for: TSH    ASSESSMENT AND PLAN 52 y.o. year old male  has a past medical history of Anxiety, Asthma, Brain tumor ([place]), Constipation, Depression, GERD (gastroesophageal reflux disease), Head injury due to trauma, Peptic ulcer, Seizure ([place]), Shortness of breath dyspnea, and Sickle cell trait ([place]). here with:  1.  Seizures 2.  History of meningioma brain  Overall the patient has done well on Keppra.  He will continue on Keppra 750 mg twice a day.  If he has significant changes with his mood Keppra may need to be reconsidered.  Patient voiced understanding.  He is advised that if his symptoms worsen or he develops new symptoms he should let us know.  He will follow-up in 6 months or sooner if needed.   Ward Givens, MSN, NP-C 02/06/2018, 8:32 AM [facility] [address] [place], [place] [address] [phone]

## 2018-02-06 NOTE — Patient Instructions (Addendum)
Your Plan:  Continue Keppra 750 mg twice a day If you have significant changes in your mood then let us know If you have any seizures please let us know If your symptoms worsen or you develop new symptoms please let us know.   Thank you for coming to see [place] at [facility]. I hope we have been able to provide you high quality care today.  You may receive a patient satisfaction survey over the next few weeks. We would appreciate your feedback and comments so that we may continue to improve ourselves and the health of our patients.

## 2018-02-13 ENCOUNTER — Ambulatory Visit: Payer: Medicaid Other

## 2018-07-26 ENCOUNTER — Ambulatory Visit: Payer: Medicaid Other | Admitting: Family Medicine

## 2018-07-26 ENCOUNTER — Other Ambulatory Visit: Payer: Self-pay

## 2018-08-08 ENCOUNTER — Encounter: Payer: Self-pay | Admitting: Adult Health

## 2018-08-14 ENCOUNTER — Ambulatory Visit: Payer: Medicaid Other | Admitting: Adult Health

## 2019-02-16 ENCOUNTER — Other Ambulatory Visit: Payer: Self-pay | Admitting: Adult Health

## 2019-02-19 NOTE — Telephone Encounter (Signed)
Not able to [facility] (gave one refill) and note to call office.

## 2019-05-17 ENCOUNTER — Other Ambulatory Visit: Payer: Self-pay | Admitting: Adult Health

## 2019-06-29 ENCOUNTER — Other Ambulatory Visit: Payer: Self-pay

## 2019-06-29 ENCOUNTER — Ambulatory Visit: Payer: Medicaid Other | Attending: Internal Medicine | Admitting: Internal Medicine

## 2019-06-29 ENCOUNTER — Encounter: Payer: Self-pay | Admitting: Internal Medicine

## 2019-06-29 VITALS — BP 142/91 | HR 81 | Temp 98.4°F | Resp 16 | Ht 75.0 in | Wt 274.8 lb

## 2019-06-29 DIAGNOSIS — E785 Hyperlipidemia, unspecified: Secondary | ICD-10-CM | POA: Diagnosis not present

## 2019-06-29 DIAGNOSIS — R569 Unspecified convulsions: Secondary | ICD-10-CM | POA: Diagnosis not present

## 2019-06-29 DIAGNOSIS — G4489 Other headache syndrome: Secondary | ICD-10-CM | POA: Diagnosis not present

## 2019-06-29 DIAGNOSIS — H53149 Visual discomfort, unspecified: Secondary | ICD-10-CM | POA: Diagnosis not present

## 2019-06-29 DIAGNOSIS — N182 Chronic kidney disease, stage 2 (mild): Secondary | ICD-10-CM | POA: Insufficient documentation

## 2019-06-29 DIAGNOSIS — F419 Anxiety disorder, unspecified: Secondary | ICD-10-CM | POA: Diagnosis not present

## 2019-06-29 DIAGNOSIS — Z79899 Other long term (current) drug therapy: Secondary | ICD-10-CM | POA: Diagnosis not present

## 2019-06-29 DIAGNOSIS — Z2821 Immunization not carried out because of patient refusal: Secondary | ICD-10-CM

## 2019-06-29 DIAGNOSIS — H538 Other visual disturbances: Secondary | ICD-10-CM | POA: Insufficient documentation

## 2019-06-29 DIAGNOSIS — M549 Dorsalgia, unspecified: Secondary | ICD-10-CM | POA: Insufficient documentation

## 2019-06-29 DIAGNOSIS — M5416 Radiculopathy, lumbar region: Secondary | ICD-10-CM | POA: Insufficient documentation

## 2019-06-29 DIAGNOSIS — F17210 Nicotine dependence, cigarettes, uncomplicated: Secondary | ICD-10-CM | POA: Diagnosis not present

## 2019-06-29 DIAGNOSIS — I129 Hypertensive chronic kidney disease with stage 1 through stage 4 chronic kidney disease, or unspecified chronic kidney disease: Secondary | ICD-10-CM | POA: Diagnosis not present

## 2019-06-29 DIAGNOSIS — D329 Benign neoplasm of meninges, unspecified: Secondary | ICD-10-CM | POA: Diagnosis not present

## 2019-06-29 DIAGNOSIS — R030 Elevated blood-pressure reading, without diagnosis of hypertension: Secondary | ICD-10-CM

## 2019-06-29 DIAGNOSIS — R200 Anesthesia of skin: Secondary | ICD-10-CM | POA: Insufficient documentation

## 2019-06-29 DIAGNOSIS — F329 Major depressive disorder, single episode, unspecified: Secondary | ICD-10-CM | POA: Insufficient documentation

## 2019-06-29 MED ORDER — LEVETIRACETAM 750 MG PO TABS: 750.0000 mg | ORAL_TABLET | Freq: Two times a day (BID) | ORAL | 3 refills | Status: DC

## 2019-06-29 MED ORDER — CYCLOBENZAPRINE HCL 5 MG PO TABS: 5.0000 mg | ORAL_TABLET | Freq: Three times a day (TID) | ORAL | 1 refills | Status: DC | PRN

## 2019-06-29 MED ORDER — ESCITALOPRAM OXALATE 10 MG PO TABS: 10.0000 mg | ORAL_TABLET | Freq: Every day | ORAL | 6 refills | Status: DC

## 2019-06-29 MED ORDER — ATORVASTATIN CALCIUM 10 MG PO TABS: 10.0000 mg | ORAL_TABLET | Freq: Every day | ORAL | 3 refills | Status: DC

## 2019-06-29 MED ORDER — TRAMADOL HCL 50 MG PO TABS: 50.0000 mg | ORAL_TABLET | Freq: Three times a day (TID) | ORAL | 0 refills | Status: DC | PRN

## 2019-06-29 NOTE — Progress Notes (Signed)
Patient ID: Jeremy Soto, male    DOB: 19-May-1966  MRN: [id number]  CC: re-establish and Back Pain   Subjective: Nicholos Huaman is a 53 y.o. male who presents for U/C visit for back pain.  Last seen 2018 His concerns today include:  Pt with hx of tob dep, HL, depression/anxiety, meningioma status post craniotomy 12/2015(initial pt was thought to have CVA but brain lesion discovered on MRI), generalized seizure diagnosed at the time of meningioma.  C/o constant LT lower back pain x 2 mth.  No initiating factors that he recalls.   -sometimes it radiates to LEs. For a long time in R leg then LT leg.  Intermittent radiation to LT thigh now.  "Very little numbness in thighs."  No loss of bowel or bladder function. -worse when walking and when he has to stand up.  -better when sitting with pillows to back and with laying down -taking Ibuprofen Q2-3 hrs 800 mg.  Rate pain 10/10  Needing RF on meds including Keppra, Lexapro, Atorvastatin  SZ: last saw neurologist 01/2018.   -had "small one" 2 wks ago. Felt his mouth pulling to one side. Did not lose consciousness.  Had been out of [place] for 5 mths prior to 1 mth ago.  Just got RF last mth from neurology NP. Reports he was having  HA every 3-4 days for several days leading up to his [birth year] which was on 06/23/2019.  HA associated with pain behind RT eye. No N/V.  Some blurred vision and photophobia.  Reports similar headaches in 2017 before it was discovered that he had a large right-sided meningioma that was subsequently resected Does not drive.    Dep/Anx: out of lexapro x 5 mths.  Feels he needs to be back on it. No SI.    Patient Active Problem List   Diagnosis Date Noted  . Right shoulder pain 01/29/2018  . Hyperlipidemia 08/05/2017  . Anxiety and depression 08/05/2017  . Hypertension 08/05/2017  . Diabetes mellitus screening 04/01/2017  . S/P craniotomy 01/27/2016  . Benign meningioma ([place]) 01/27/2016  . CKD (chronic kidney  disease) stage 2, GFR 60-89 ml/min 01/18/2016  . Tobacco abuse 01/18/2016  . Seizures ([place])   . GERD (gastroesophageal reflux disease) 04/13/2013     Current Outpatient Medications on File Prior to Visit  Medication Sig Dispense Refill  . atorvastatin (LIPITOR) 10 MG tablet Take 1 tablet (10 mg total) by mouth daily. 90 tablet 3  . diclofenac sodium (VOLTAREN) 1 % GEL Apply 2 g topically 4 (four) times daily. 100 g 0  . escitalopram (LEXAPRO) 10 MG tablet Take 1 tablet (10 mg total) by mouth daily. . 30 tablet 6  . levETIRAcetam (KEPPRA) 750 MG tablet TAKE 1 TABLET BY MOUTH TWICE A DAY 60 tablet 0  . nicotine (NICODERM CQ - DOSED IN MG/24 HR) 7 mg/24hr patch Place 1 patch (7 mg total) onto the skin daily. 30 patch 1  . nitroGLYCERIN (NITRODUR - DOSED IN MG/24 HR) 0.2 mg/hr patch Use 1/4 patch daily to the affected area. 30 patch 1   No current facility-administered medications on file prior to visit.     No Known Allergies  Social History   Socioeconomic History  . Marital status: Single    Spouse name: Not on file  . Number of children: 2  . Years of education: 25  . Highest education level: Not on file  Occupational History  . Occupation: Disability    Employer: [employer]  Social Needs  . Financial resource strain: Not on file  . Food insecurity    Worry: Not on file    Inability: Not on file  . Transportation needs    Medical: Not on file    Non-medical: Not on file  Tobacco Use  . Smoking status: Current Every Day Smoker    Packs/day: 2.00    Years: 20.00    Pack years: 40.00    Types: Cigarettes  . Smokeless tobacco: Never Used  Substance and Sexual Activity  . Alcohol use: Yes    Alcohol/week: 1.0 standard drinks    Types: 1 Cans of beer per week  . Drug use: No    Comment: denies  . Sexual activity: Yes  Lifestyle  . Physical activity    Days per week: Not on file    Minutes per session: Not on file  . Stress: Not on file  Relationships  . Social  Herbalist on phone: Not on file    Gets together: Not on file    Attends religious service: Not on file    Active member of club or organization: Not on file    Attends meetings of clubs or organizations: Not on file    Relationship status: Not on file  . Intimate partner violence    Fear of current or ex partner: Not on file    Emotionally abused: Not on file    Physically abused: Not on file    Forced sexual activity: Not on file  Other Topics Concern  . Not on file  Social History Narrative   Lives with girlfriend   Caffeine use: daily   Right handed     Family History  Problem Relation Age of Onset  . Diabetes Maternal Grandmother   . Diabetes Paternal Grandfather   . Hypertension Paternal Grandfather   . Hypertension Father   . Diabetes Father   . Sickle cell trait Father   . Hypertension Mother   . Diabetes Mother   . Sickle cell trait Mother   . Colon cancer Neg Hx   . Rectal cancer Neg Hx   . Stomach cancer Neg Hx   . Esophageal cancer Neg Hx     Past Surgical History:  Procedure Laterality Date  . CLOSED REDUCTION NASAL FRACTURE N/A 06/04/2014   Procedure: CLOSED REDUCTION NASAL FRACTURE;  Surgeon: Ascencion Dike, MD;  Location: [facility];  Service: ENT;  Laterality: N/A;  . CRANIOTOMY Right 01/27/2016   Procedure: RIGHT FRONTAL CRANIOTOMY FOR MENINGIOMA;  Surgeon: Kristeen Miss, MD;  Location: [place];  Service: Neurosurgery;  Laterality: Right;  RIGHT FRONTAL CRANIOTOMY FOR MENINGIOMA  . DIAGNOSTIC LAPAROSCOPY     - stomach  . ESOPHAGOGASTRODUODENOSCOPY ENDOSCOPY      ROS: Review of Systems Negative except as stated above  PHYSICAL EXAM: BP (!) 142/91   Pulse 81   Temp 98.4 F (36.9 C) (Oral)   Resp 16   Ht 6\' 3"  (1.905 m)   Wt 274 lb 12.8 oz (124.6 kg)   SpO2 97%   BMI 34.35 kg/m   Physical Exam  General appearance -alert, middle-age African-American male in NAD while sitting.  He does appear uncomfortable when  transferring from the chair to the exam table.  He maintains somewhat of a stooped posture with forward flexion of the trunk Mental status - normal mood, behavior, speech, dress, motor activity, and thought processes Neck - supple, no significant adenopathy Chest -  clear to auscultation, no wheezes, rales or rhonchi, symmetric air entry Heart - normal rate, regular rhythm, normal S1, S2, no murmurs, rubs, clicks or gallops Neurological -he has some decrease sensation over the right forehead and right cheek.  Otherwise cranial nerves are intact.  Power in the upper and lower extremities 5/5 bilaterally.  Gross sensation intact in the extremities.  Deep tendon reflexes at the knee and ankle are normal and equal bilaterally Musculoskeletal -mild to moderate tenderness on palpation of the lumbar spine and left lumbar paraspinal muscles.  Straight leg raise could only be done to about 30 degrees.  Straight leg raise causes pain in the left lower back Extremities -no lower extremity edema  Depression screen [place] 2/9 06/29/2019 08/04/2017 04/01/2017  Decreased Interest 3 1 2   Down, Depressed, Hopeless 2 3 2   PHQ - 2 Score 5 4 4   Altered sleeping 3 3 3   Tired, decreased energy 2 3 2   Change in appetite 2 2 3   Feeling bad or failure about yourself  2 2 2   Trouble concentrating 1 2 (No Data)  Moving slowly or fidgety/restless 1 1 2   Suicidal thoughts 0 3 0  PHQ-9 Score 16 20 16    GAD 7 : Generalized Anxiety Score 06/29/2019 08/04/2017 04/01/2017  Nervous, Anxious, on Edge 3 3 2   Control/stop worrying 3 3 2   Worry too much - different things 3 3 2   Trouble relaxing 3 3 3   Restless 3 3 2   Easily annoyed or irritable 3 3 3   Afraid - awful might happen 3 3 3   Total GAD 7 Score 21 21 17       CMP Latest Ref Rng & Units 08/04/2017 04/01/2017 11/17/2016  Glucose 65 - 99 mg/dL 107(H) - 90  BUN 6 - 24 mg/dL 13 - 10  Creatinine 0.76 - 1.27 mg/dL 1.52(H) - 1.30(H)  Sodium 134 - 144 mmol/L 141 - 139  Potassium  3.5 - 5.2 mmol/L 4.3 - 3.2(L)  Chloride 96 - 106 mmol/L 103 - 105  CO2 20 - 29 mmol/L 24 - 23  Calcium 8.7 - 10.2 mg/dL 9.9 - 9.4  Total Protein 6.0 - 8.5 g/dL - 8.1 -  Total Bilirubin 0.0 - 1.2 mg/dL - 0.9 -  Alkaline Phos 39 - 117 IU/L - 89 -  AST 0 - 40 IU/L - 17 -  ALT 0 - 44 IU/L - 13 -   Lipid Panel     Component Value Date/Time   CHOL 274 (H) 01/19/2016 0220   TRIG 213 (H) 01/19/2016 0220   HDL 37 (L) 01/19/2016 0220   CHOLHDL 7.4 01/19/2016 0220   VLDL 43 (H) 01/19/2016 0220   LDLCALC 194 (H) 01/19/2016 0220    CBC    Component Value Date/Time   WBC 6.8 11/17/2016 0435   RBC 4.09 (L) 11/17/2016 0435   HGB 13.0 11/17/2016 0435   HCT 36.5 (L) 11/17/2016 0435   PLT 221 11/17/2016 0435   MCV 89.2 11/17/2016 0435   MCH 31.8 11/17/2016 0435   MCHC 35.6 11/17/2016 0435   RDW 12.6 11/17/2016 0435   LYMPHSABS 2.5 11/17/2016 0435   MONOABS 0.4 11/17/2016 0435   EOSABS 0.0 11/17/2016 0435   BASOSABS 0.0 11/17/2016 0435    ASSESSMENT AND PLAN: 1. Lumbar radiculopathy History suggests nerve compression or slipped disc. Given that he has been trying conservative measures for 2 months without any improvement, it is reasonable to get advanced imaging. -Advised to stop ibuprofen as he is taking  too much of it.  I have placed him on Flexeril and tramadol.  Advised that both medications can cause drowsiness and to take only when needed.  Advised that tramadol is a controlled substance. [place] reviewed - cyclobenzaprine (FLEXERIL) 5 MG tablet; Take 1 tablet (5 mg total) by mouth 3 (three) times daily as needed for muscle spasms.  Dispense: 30 tablet; Refill: 1 - traMADol (ULTRAM) 50 MG tablet; Take 1 tablet (50 mg total) by mouth every 8 (eight) hours as needed.  Dispense: 60 tablet; Refill: 0 - MR Lumbar Spine Wo Contrast; Future  2. Headache syndrome Suggestive of migraine or cluster headaches.  However given his history of having brain tumor removed and this is new onset  headache, I think it is reasonable for [place] to get advanced imaging. - MR Brain W Wo Contrast; Future  3. Seizures (HCC) - levETIRAcetam (KEPPRA) 750 MG tablet; Take 1 tablet (750 mg total) by mouth 2 (two) times daily.  Dispense: 60 tablet; Refill: 3 - Ambulatory referral to Neurology - Levetiracetam level - CBC - Comprehensive metabolic panel  4. Benign meningioma ([place])  - MR Brain W Wo Contrast; Future  5. Hyperlipidemia, unspecified hyperlipidemia type - atorvastatin (LIPITOR) 10 MG tablet; Take 1 tablet (10 mg total) by mouth daily.  Dispense: 90 tablet; Refill: 3 - Lipid panel  6. Anxiety and depression Positive PHQ 9 and gad 7 screens.  Refill Lexapro - escitalopram (LEXAPRO) 10 MG tablet; Take 1 tablet (10 mg total) by mouth daily. .  Dispense: 30 tablet; Refill: 6  7. Elevated blood pressure reading Advised to stop ibuprofen.  We will plan to recheck blood pressure on follow-up visit  8. Influenza vaccination declined  Patient was given the opportunity to ask questions.  Patient verbalized understanding of the plan and was able to repeat key elements of the plan.   No orders of the defined types were placed in this encounter.    Requested Prescriptions   Pending Prescriptions Disp Refills  . atorvastatin (LIPITOR) 10 MG tablet 90 tablet 3    Sig: Take 1 tablet (10 mg total) by mouth daily.  Marland Kitchen escitalopram (LEXAPRO) 10 MG tablet 30 tablet 6    Sig: Take 1 tablet (10 mg total) by mouth daily. .  . levETIRAcetam (KEPPRA) 750 MG tablet 60 tablet 0    Sig: Take 1 tablet (750 mg total) by mouth 2 (two) times daily.    No follow-ups on file.  Karle Plumber, MD, FACP

## 2019-07-02 LAB — CBC
Hematocrit: 36.3 % — ABNORMAL LOW (ref 37.5–51.0)
Hemoglobin: 12.5 g/dL — ABNORMAL LOW (ref 13.0–17.7)
MCH: 32 pg (ref 26.6–33.0)
MCHC: 34.4 g/dL (ref 31.5–35.7)
MCV: 93 fL (ref 79–97)
Platelets: 203 10*3/uL (ref 150–450)
RBC: 3.91 x10E6/uL — ABNORMAL LOW (ref 4.14–5.80)
RDW: 13.5 % (ref 11.6–15.4)
WBC: 5.3 10*3/uL (ref 3.4–10.8)

## 2019-07-02 LAB — COMPREHENSIVE METABOLIC PANEL
ALT: 44 IU/L (ref 0–44)
AST: 31 IU/L (ref 0–40)
Albumin/Globulin Ratio: 2 (ref 1.2–2.2)
Albumin: 4.9 g/dL (ref 3.8–4.9)
Alkaline Phosphatase: 77 IU/L (ref 39–117)
BUN/Creatinine Ratio: 12 (ref 9–20)
BUN: 17 mg/dL (ref 6–24)
Bilirubin Total: 0.5 mg/dL (ref 0.0–1.2)
CO2: 25 mmol/L (ref 20–29)
Calcium: 9.5 mg/dL (ref 8.7–10.2)
Chloride: 99 mmol/L (ref 96–106)
Creatinine, Ser: 1.41 mg/dL — ABNORMAL HIGH (ref 0.76–1.27)
GFR calc Af Amer: 65 mL/min/{1.73_m2} (ref >59)
GFR calc non Af Amer: 56 mL/min/{1.73_m2} — ABNORMAL LOW (ref >59)
Globulin, Total: 2.4 g/dL (ref 1.5–4.5)
Glucose: 97 mg/dL (ref 65–99)
Potassium: 4.3 mmol/L (ref 3.5–5.2)
Sodium: 138 mmol/L (ref 134–144)
Total Protein: 7.3 g/dL (ref 6.0–8.5)

## 2019-07-02 LAB — LIPID PANEL
Chol/HDL Ratio: 8.8 ratio — ABNORMAL HIGH (ref 0.0–5.0)
Cholesterol, Total: 317 mg/dL — ABNORMAL HIGH (ref 100–199)
HDL: 36 mg/dL — ABNORMAL LOW (ref >39)
LDL Calculated: 219 mg/dL — ABNORMAL HIGH (ref 0–99)
Triglycerides: 312 mg/dL — ABNORMAL HIGH (ref 0–149)
VLDL Cholesterol Cal: 62 mg/dL — ABNORMAL HIGH (ref 5–40)

## 2019-07-02 LAB — LEVETIRACETAM LEVEL: Levetiracetam Lvl: 10.4 ug/mL (ref 10.0–40.0)

## 2019-07-04 ENCOUNTER — Other Ambulatory Visit: Payer: Self-pay | Admitting: Internal Medicine

## 2019-07-04 ENCOUNTER — Telehealth: Payer: Self-pay

## 2019-07-04 DIAGNOSIS — E785 Hyperlipidemia, unspecified: Secondary | ICD-10-CM

## 2019-07-04 DIAGNOSIS — R569 Unspecified convulsions: Secondary | ICD-10-CM

## 2019-07-04 MED ORDER — LEVETIRACETAM 1000 MG PO TABS: 1000.0000 mg | ORAL_TABLET | Freq: Two times a day (BID) | ORAL | 6 refills | Status: AC

## 2019-07-04 MED ORDER — ATORVASTATIN CALCIUM 20 MG PO TABS: 20.0000 mg | ORAL_TABLET | Freq: Every day | ORAL | 6 refills | Status: DC

## 2019-07-04 NOTE — Telephone Encounter (Signed)
Contacted pt to go over lab results pt is aware    Dr. Wynetta Emery pt states he takes ibuprofen because he has headaches. Pt is wanting to know what can he take for his headaches.

## 2019-07-05 NOTE — Telephone Encounter (Signed)
Return pt call and went over Dr. Wynetta Emery message pt is aware and doesn't have any questions or concerns

## 2019-07-13 ENCOUNTER — Telehealth: Payer: Self-pay | Admitting: Internal Medicine

## 2019-07-16 ENCOUNTER — Ambulatory Visit (HOSPITAL_COMMUNITY): Payer: Medicaid Other

## 2019-07-24 ENCOUNTER — Telehealth: Payer: Self-pay | Admitting: Internal Medicine

## 2019-07-24 NOTE — Telephone Encounter (Signed)
[facility] with [facility] called in regards to patients upcoming appointment. States the appointment has already been reschedule due to no prior auth. Please follow up. States they will be in from 9:00 am -5:30 pm

## 2019-07-25 NOTE — Telephone Encounter (Signed)
[facility] called again to request this be done as soon as possible because pt had to be rescheduled

## 2019-07-26 NOTE — Telephone Encounter (Signed)
MA informed staff of PA being approved today for MRI BRAIN and MRI SPINE.

## 2019-07-27 ENCOUNTER — Other Ambulatory Visit: Payer: Self-pay

## 2019-07-27 ENCOUNTER — Ambulatory Visit (HOSPITAL_COMMUNITY)
Admission: RE | Admit: 2019-07-27 | Discharge: 2019-07-27 | Disposition: A | Discharge status: Home / Self Care | Payer: Medicaid Other | Source: Ambulatory Visit | Attending: Internal Medicine | Admitting: Internal Medicine

## 2019-07-27 DIAGNOSIS — G4489 Other headache syndrome: Secondary | ICD-10-CM | POA: Insufficient documentation

## 2019-07-27 DIAGNOSIS — M5416 Radiculopathy, lumbar region: Secondary | ICD-10-CM | POA: Diagnosis present

## 2019-07-27 DIAGNOSIS — D329 Benign neoplasm of meninges, unspecified: Secondary | ICD-10-CM

## 2019-07-27 MED ORDER — GADOBUTROL 1 MMOL/ML IV SOLN
10.0000 mL | Freq: Once | INTRAVENOUS | Status: AC | PRN
  Administered 2019-07-27: 17:00:00 10 mL via INTRAVENOUS

## 2019-07-28 ENCOUNTER — Other Ambulatory Visit: Payer: Self-pay | Admitting: Internal Medicine

## 2019-07-28 DIAGNOSIS — M5416 Radiculopathy, lumbar region: Secondary | ICD-10-CM

## 2019-07-31 ENCOUNTER — Telehealth: Payer: Self-pay | Admitting: Neurology

## 2019-07-31 ENCOUNTER — Institutional Professional Consult (permissible substitution): Payer: Medicaid Other | Admitting: Neurology

## 2019-07-31 ENCOUNTER — Encounter: Payer: Self-pay | Admitting: Neurology

## 2019-07-31 NOTE — Telephone Encounter (Signed)
This patient did not show for a revisit appointment today. 

## 2019-08-03 ENCOUNTER — Other Ambulatory Visit: Payer: Self-pay

## 2019-08-03 ENCOUNTER — Encounter: Payer: Self-pay | Admitting: Internal Medicine

## 2019-08-03 ENCOUNTER — Encounter: Payer: Self-pay | Admitting: Licensed Clinical Social Worker

## 2019-08-03 ENCOUNTER — Ambulatory Visit: Payer: Medicaid Other | Attending: Internal Medicine | Admitting: Internal Medicine

## 2019-08-03 DIAGNOSIS — Z1211 Encounter for screening for malignant neoplasm of colon: Secondary | ICD-10-CM

## 2019-08-03 DIAGNOSIS — G43009 Migraine without aura, not intractable, without status migrainosus: Secondary | ICD-10-CM

## 2019-08-03 DIAGNOSIS — F323 Major depressive disorder, single episode, severe with psychotic features: Secondary | ICD-10-CM

## 2019-08-03 DIAGNOSIS — G40909 Epilepsy, unspecified, not intractable, without status epilepticus: Secondary | ICD-10-CM

## 2019-08-03 DIAGNOSIS — M5416 Radiculopathy, lumbar region: Secondary | ICD-10-CM | POA: Diagnosis not present

## 2019-08-03 DIAGNOSIS — E782 Mixed hyperlipidemia: Secondary | ICD-10-CM

## 2019-08-03 DIAGNOSIS — D649 Anemia, unspecified: Secondary | ICD-10-CM

## 2019-08-03 DIAGNOSIS — N182 Chronic kidney disease, stage 2 (mild): Secondary | ICD-10-CM

## 2019-08-03 MED ORDER — DULOXETINE HCL 20 MG PO CPEP: 20.0000 mg | ORAL_CAPSULE | Freq: Every day | ORAL | 3 refills | Status: DC

## 2019-08-03 MED ORDER — TRAMADOL HCL 50 MG PO TABS: 50.0000 mg | ORAL_TABLET | Freq: Three times a day (TID) | ORAL | 0 refills | Status: DC | PRN

## 2019-08-03 NOTE — Progress Notes (Signed)
Call placed to patient. LCSW introduced self and explained role at [facility]. Pt was informed of referral from PCP to address behavioral health and/or resource needs.   Pt shared increase in depression and anxiety symptoms triggered by difficulty managing chronic medical conditions, back pain, and financial strain. He endorses audio commanding hallucinations on a routine basis that talk with him and "scares me" at times. Pt reports hx of suicidal and homicidal ideations; however, denies current intent or plan. There are no weapons in the home.   Protective factors were identified, safety plan was discussed, and crisis intervention resources were provided (LCSW emailed resources to email on file) Pt receives strong support from live-in girl-friend, mother (local), and friends.   Validation and encouragement was provided. LCSW discussed therapeutic strategies to assist with management and/or decrease in symptoms. Pt was successful in identifying healthy coping skills (refurbishing jewelry box, coloring, listening to music) noting that walking helped him in past; however, he has limited mobility. Pt has a Chief Executive Officer to assist with disability appeal.   Pt is participating in medication management and is open to psychotherapy.   Plan of Action LCSW will follow up with psychiatry referral

## 2019-08-03 NOTE — Progress Notes (Signed)
Patient verified DOB Patient has taken medication today. Patient has eaten today. Patient denies pain at this time. Patient states he receives minimal relief with the tramadol. Patient complains of pain when walking. Patient complains of depression due to not being able to do the things he loved to do.

## 2019-08-03 NOTE — Progress Notes (Signed)
Patient spoke with PCP over the phone today

## 2019-08-03 NOTE — Progress Notes (Signed)
Virtual Visit via Telephone Note Due to current restrictions/limitations of in-office visits due to the COVID-19 pandemic, this scheduled clinical appointment was converted to a telehealth visit  I connected with Jeremy Soto on 08/03/19 at 12:07 p.m by telephone and verified that I am speaking with the correct person using two identifiers. I am in my office.  The patient is at home.  Only the patient and myself participated in this encounter.  I discussed the limitations, risks, security and privacy concerns of performing an evaluation and management service by telephone and the availability of in person appointments. I also discussed with the patient that there may be a patient responsible charge related to this service. The patient expressed understanding and agreed to proceed.   History of Present Illness: Pt with hx oftob dep, HL,depression/anxiety,meningioma status post craniotomy3/2017(initial pt was thought to have CVA but brain lesion discovered on MRI),generalized seizure diagnosed at the time of meningioma.  Lumbar radiculopathy:  MRI of back showed mild bulging discs in lumbar spine. No slip disc. Pt reports he still has a lot of pain in his back.  Will refer to pain specialist for further eval. he is requesting increased dose on tramadol.  HA:  Reports that he has not had any HA over past several days.  When they occur, they always start behine the RT eye then does to the back of head No N/V.  Some blurred vision and photophobia.  Reports similar headaches in 2017 before it was discovered that he had a large right-sided meningioma that was subsequently resected -MRI of brain done recently showed no new mass or new brain abnormality.  Did reveal scattered punctate foci of T2 within the cerebral hemispheric white matter that was not changed.  These could be due to migraine related foci or indicate an early manifestation of small vessel change. Referred to neurology on last visit  for hx of SZ.  Appt scheduled for next mth -no SZ since last visit.  Keppra dose inc to 1 gram BID due to Keppra level being borderline normal range  Anemia:  Has a mild stable normocytic anemia No blood in stools Not sure of any hx of colon CA.  He is agreeable for referral for colonoscopy .   HL:  LDL was 219 with total cholesterol 317.  Fhx of hyperlipidemia.  Lipitor dose increase to 20 mg.  Reports compliance  CKD 2:  eGFR stable compared to 1 yr ago.  Depression:  Reports he still get depress.  Past several days have not been good.  He tells me that he has been having bad thoughts and hearing voices telling him that he is no good.  He admits to suicidal ideation at times.  He felt that way yesterday.  Feels he can not do the things he use to do. Feels scared at times.  Does not like to talk about it.  He thinks he has been taking the Lexapro.  His girlfriend fills his med box and remind him to take his medications.    Observations/Objective:  Results for orders placed or performed in visit on 06/29/19  Levetiracetam level  Result Value Ref Range   Levetiracetam Lvl 10.4 10.0 - 40.0 ug/mL  CBC  Result Value Ref Range   WBC 5.3 3.4 - 10.8 x10E3/uL   RBC 3.91 (L) 4.14 - 5.80 x10E6/uL   Hemoglobin 12.5 (L) 13.0 - 17.7 g/dL   Hematocrit 36.3 (L) 37.5 - 51.0 %   MCV 93 79 - 97  fL   MCH 32.0 26.6 - 33.0 pg   MCHC 34.4 31.5 - 35.7 g/dL   RDW 13.5 11.6 - 15.4 %   Platelets 203 150 - 450 x10E3/uL  Comprehensive metabolic panel  Result Value Ref Range   Glucose 97 65 - 99 mg/dL   BUN 17 6 - 24 mg/dL   Creatinine, Ser 1.41 (H) 0.76 - 1.27 mg/dL   GFR calc non Af Amer 56 (L) >59 mL/min/1.73   GFR calc Af Amer 65 >59 mL/min/1.73   BUN/Creatinine Ratio 12 9 - 20   Sodium 138 134 - 144 mmol/L   Potassium 4.3 3.5 - 5.2 mmol/L   Chloride 99 96 - 106 mmol/L   CO2 25 20 - 29 mmol/L   Calcium 9.5 8.7 - 10.2 mg/dL   Total Protein 7.3 6.0 - 8.5 g/dL   Albumin 4.9 3.8 - 4.9 g/dL    Globulin, Total 2.4 1.5 - 4.5 g/dL   Albumin/Globulin Ratio 2.0 1.2 - 2.2   Bilirubin Total 0.5 0.0 - 1.2 mg/dL   Alkaline Phosphatase 77 39 - 117 IU/L   AST 31 0 - 40 IU/L   ALT 44 0 - 44 IU/L  Lipid panel  Result Value Ref Range   Cholesterol, Total 317 (H) 100 - 199 mg/dL   Triglycerides 312 (H) 0 - 149 mg/dL   HDL 36 (L) >39 mg/dL   VLDL Cholesterol Cal 62 (H) 5 - 40 mg/dL   LDL Calculated 219 (H) 0 - 99 mg/dL   Chol/HDL Ratio 8.8 (H) 0.0 - 5.0 ratio    Assessment and Plan: 1. Lumbar radiculopathy No significant nerve compression seen on MRI.  Does have some bulging disc.  I have referred him to a pain specialist to see whether he would benefit from injections versus physical therapy. -I have done a refill on tramadol at the current dose. Change Lexapro to Cymbalta which should help with pain - DULoxetine (CYMBALTA) 20 MG capsule; Take 1 capsule (20 mg total) by mouth daily. Stop Lexapro  Dispense: 30 capsule; Refill: 3 - traMADol (ULTRAM) 50 MG tablet; Take 1 tablet (50 mg total) by mouth every 8 (eight) hours as needed.  Dispense: 60 tablet; Refill: 0  2. Current severe episode of major depressive disorder with psychotic features, unspecified whether recurrent [facility]) Advised patient to be seen in the emergency room today given that he is having depression with some psychotic features and that he is hearing voices.  Patient tells me he will go and be seen in the emergency room as instructed. -I will have our LCSW follow-up with him -Stop Lexapro.  Change to Cymbalta - Ambulatory referral to Psychiatry - DULoxetine (CYMBALTA) 20 MG capsule; Take 1 capsule (20 mg total) by mouth daily. Stop Lexapro  Dispense: 30 capsule; Refill: 3  3. Migraine without aura and without status migrainosus, not intractable Headaches suggest migraines. I did not want to overburdened the patient with too many medication in addition or changes on this telephone visit.  Will discuss further on in person  visit.  I have also asked him to discuss with the neurologist whom he will see next month  4. Seizure disorder ([place]) Continue Keppra  5. Normochromic anemia - Ambulatory referral to Gastroenterology  6. CKD (chronic kidney disease) stage 2, GFR 60-89 ml/min Advised against long-term NSAID use  7. Mixed hyperlipidemia May have familial hyperlipidemia.  Plan to recheck lipid profile in several months.  Continue Lipitor.  The dose was increased to 20  mg  8. Screening for colon cancer - Ambulatory referral to Gastroenterology   Follow Up Instructions: 4-5 wks   I discussed the assessment and treatment plan with the patient. The patient was provided an opportunity to ask questions and all were answered. The patient agreed with the plan and demonstrated an understanding of the instructions.   The patient was advised to call back or seek an in-person evaluation if the symptoms worsen or if the condition fails to improve as anticipated.  I provided 27 minutes of non-face-to-face time during this encounter.   Karle Plumber, MD

## 2019-08-03 NOTE — Progress Notes (Signed)
Patient was made aware during televisit todaey

## 2019-08-04 ENCOUNTER — Other Ambulatory Visit: Payer: Self-pay | Admitting: Internal Medicine

## 2019-08-04 DIAGNOSIS — M5416 Radiculopathy, lumbar region: Secondary | ICD-10-CM

## 2019-08-11 ENCOUNTER — Other Ambulatory Visit: Payer: Self-pay | Admitting: Internal Medicine

## 2019-08-11 DIAGNOSIS — M5416 Radiculopathy, lumbar region: Secondary | ICD-10-CM

## 2019-08-11 NOTE — Progress Notes (Signed)
PMR referral for pain management declined. Dr. Letta Pate does not think he has anything to offer him. Will try referral to different group.

## 2019-08-13 ENCOUNTER — Encounter: Payer: Self-pay | Admitting: Internal Medicine

## 2019-08-23 ENCOUNTER — Encounter (HOSPITAL_COMMUNITY): Payer: Self-pay | Admitting: Psychology

## 2019-08-23 ENCOUNTER — Other Ambulatory Visit: Payer: Self-pay

## 2019-08-23 ENCOUNTER — Ambulatory Visit (INDEPENDENT_AMBULATORY_CARE_PROVIDER_SITE_OTHER): Payer: Medicaid Other | Admitting: Psychology

## 2019-08-23 DIAGNOSIS — F411 Generalized anxiety disorder: Secondary | ICD-10-CM

## 2019-08-23 DIAGNOSIS — F331 Major depressive disorder, recurrent, moderate: Secondary | ICD-10-CM

## 2019-08-23 NOTE — Progress Notes (Signed)
Virtual Visit via Telephone Note  I connected with Jeremy Soto on 08/23/19 at 11:00 AM EDT by telephone and verified that I am speaking with the correct person using two identifiers.   I discussed the limitations, risks, security and privacy concerns of performing an evaluation and management service by telephone and the availability of in person appointments. I also discussed with the patient that there may be a patient responsible charge related to this service. The patient expressed understanding and agreed to proceed.    I discussed the assessment and treatment plan with the patient. The patient was provided an opportunity to ask questions and all were answered. The patient agreed with the plan and demonstrated an understanding of the instructions.   The patient was advised to call back or seek an in-person evaluation if the symptoms worsen or if the condition fails to improve as anticipated.  I provided 61 minutes of non-face-to-face time during this encounter.   Jeremy Soto [facility]  Comprehensive Clinical Assessment (CCA) Note  08/23/2019 [name] [id number]  Visit Diagnosis:      ICD-10-CM   1. MDD (major depressive disorder), recurrent episode, moderate (HCC)  F33.1   2. GAD (generalized anxiety disorder)  F41.1       CCA Part One  Part One has been completed on paper by the patient.  (See scanned document in Chart Review)  CCA Part Two A  Intake/Chief Complaint:  CCA Intake With Chief Complaint CCA Part Two Date: 08/23/19 CCA Part Two Time: 66 Chief Complaint/Presenting Problem: Pt is referred by PCP, Dr. Wynetta Emery, at [facility].  Dr. Wynetta Emery is currenlty tx for MDD.   Pt reports a long hx of struggles w/ anger since a young age and feeling very hurt by things over the years.  pt reported he first had tx when he was a teen, then in adulthood several years ago and now seeking counseling again.  pt also reported hx of drug use in  the past, criminal hx related to assault charges and last in [place] recovery tx 4 years ago and has done well w/ that since.  pt reported that he went to [employer] on full football scholarship for 3 semesters and was being recruited by the [employer].  pt reported that he had a daughter just after highschool and mother of the baby didn't have support from family to raise as interracial child and so his mother took in his daughter.  pt reported that he felt betrayed by mother as started seeking child support "behind his back" but family kept ensuring he was ok and to not worry about notices received.  pt reported he was arrested for failing to pay child support and that his life changed from here.  he dropped out of college to care for his daughter and began working multiple jobs, pt reported that he got "twisted up in selling drugs" for a friend and then eventually began using.  pt reported he struggled w/ having outbursts and going off on people but didn't want to.  pt reported that more recently he has had stressors of medical issues- dx at age 82y/o w/ brain tumor found after crisis of seizure and stroke and had surgery to remove.  pt reported that he has struggled since w/ seizures, severe headaches and worry about his health.  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 Patients Currently Reported Symptoms/Problems: Pt reports he currently struggles a lot w/ depression, feeling hurt  over the years .  pt reports struggles w/ sleep disturbance at times.  pt reports if he has any headache he can't sleep for fear that he will not wake up.  pt reports he struggles w/ getting easily agitated at times and will just go on his "power walks" at that time- sometimes walking miles.  pt reports this allows him to not react to his anger and hurts someone or himself. pt reports he has spent jail time for doing things he is not proud of in the past- but no recent aggression towards others.  pt reports he does hear  voices at times- no experiencing currently and has through adult years.  pt reports he hasn't used any drugs in 4 years, pt is still a daily smoker and reports he as taken an occassional drink in past 4 years- last one month ago.  pt reports he is currently under the care of his PCP and neurologist. Collateral Involvement: none Individual's Strengths: supports "girlfriend, mom, daughters". pt enjoys time w/ grandkids, likes being outside" Individual's Preferences: "some relief, some peace w/in myself and some joy again in life" Type of Services Patient Feels Are Needed: counseling.  continue medication management w/ PCP.  Mental Health Symptoms Depression:  Depression: Change in energy/activity, Irritability, Worthlessness, Sleep (too much or little), Difficulty Concentrating, Fatigue  Mania:  Mania: Irritability  Anxiety:   Anxiety: Difficulty concentrating, Irritability, Fatigue, Restlessness, Sleep, Tension, Worrying  Psychosis:  Psychosis: Hallucinations(reports hearing voices at times.)  Trauma:  Trauma: N/A  Obsessions:  Obsessions: N/A  Compulsions:  Compulsions: N/A  Inattention:  Inattention: N/A  Hyperactivity/Impulsivity:  Hyperactivity/Impulsivity: N/A  Oppositional/Defiant Behaviors:  Oppositional/Defiant Behaviors: Angry, Temper  Borderline Personality:  Emotional Irregularity: Intense/inappropriate anger  Other Mood/Personality Symptoms:      Mental Status Exam Appearance and self-care  Stature:  Stature: Tall(per pt report- 6'3")  Weight:  Weight: (unable to assess)  Clothing:  Clothing: (not able to assess)  Grooming:  Grooming: (not able to assess)  Cosmetic use:  Cosmetic Use: (not able to asess)  Posture/gait:  Posture/Gait: (not able to assess)  Motor activity:  Motor Activity: (not able to asses)  Sensorium  Attention:  Attention: Normal  Concentration:  Concentration: Normal  Orientation:  Orientation: X5  Recall/memory:  Recall/Memory: Normal  Affect and Mood   Affect:  Affect: Appropriate  Mood:  Mood: Anxious, Depressed, Irritable  Relating  Eye contact:  Eye Contact: (not able to assess)  Facial expression:  Facial Expression: (not able to assess)  Attitude toward examiner:  Attitude Toward Examiner: Cooperative  Thought and Language  Speech flow: Speech Flow: Normal  Thought content:  Thought Content: Appropriate to mood and circumstances  Preoccupation:     Hallucinations:  Hallucinations: (denies any current auditory hallucinations)  Organization:     Transport planner of Knowledge:  Fund of Knowledge: Average  Intelligence:  Intelligence: Average  Abstraction:  Abstraction: Normal  Judgement:  Judgement: Fair, Normal  Reality Testing:  Reality Testing: Adequate  Insight:  Insight: Fair  Decision Making:  Decision Making: Normal, Impulsive  Social Functioning  Social Maturity:  Social Maturity: Responsible  Social Judgement:  Social Judgement: Normal  Stress  Stressors:  Stressors: Illness(conflicts)  Coping Ability:  Coping Ability: English as a second language teacher Deficits:     Supports:      Family and Psychosocial History: Family history Marital status: Long term relationship Long term relationship, how long?: in long term relationship w/ girlfriend of 4 years.  met in  rehab together.  pt reports help each other be accountable. Additional relationship information: pt was married once for couple of months and annulled. Are you sexually active?: Yes Does patient have children?: Yes How many children?: 2 How is patient's relationship with their children?: pt has 2 adult daughers- oldest in her 4s, youngests in her late 10s.  pt reports positive relationship w/ both and has 4 grandchildren- 3 granddaughters and 1 grandson that is scheduled to be induced tomorrow.  Childhood History:  Childhood History By whom was/is the patient raised?: Mother Additional childhood history information: Pt reported raised by his mother.  pt  reported never knew his father growing up- knew of him and ran into him a couple of times. Description of patient's relationship with caregiver when they were a child: Pt was close w/ mom- reports supportive.  father wasn't part of his life growing up. Patient's description of current relationship with people who raised him/her: pt reports close to mother- but at times still struggles w/ being betrayed re: first daughter.   pt met his father at age 21y/o.  pt reports they keep in touch but not extremely close to as wasn't there for him growing up. Does patient have siblings?: Yes Number of Siblings: 1 Description of patient's current relationship with siblings: pt grew up w/ an older brother.  pt reports they are good currently.  pt reports on his father's side has over 20 siblings. 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 Did You Have Any Special Interests In School?: football Did You Have An Individualized Education Program (IIEP): No Did You Have Any  Difficulty At School?: No  Religion: Religion/Spirituality Are You A Religious Person?: Yes(not religious but believes in God) How Might This Affect Treatment?: support  Leisure/Recreation: Leisure / Recreation Leisure and Hobbies: being outside, working on [employer], spending time w/ grandchildren, coloring.  Exercise/Diet: Exercise/Diet Do You Exercise?: No Have You Gained or Lost A Significant Amount of Weight in the Past Six Months?: No Do You Follow a Special Diet?: No Do You Have Any Trouble Sleeping?: Yes Explanation of Sleeping Difficulties: difficulty falling asleep  CCA Part Two C  Alcohol/Drug Use: Alcohol / Drug Use History of alcohol / drug use?: Yes(pt reported his drugs of choice were alcohol, marijuana and cocaine.  Pt had rehab in the past.  last was four years ago and feels that he has managed well.  no use in past 4 years of drugs, some alcohol use- last one month ago.) Negative Consequences of Use: Personal relationships, Financial  CCA Part Three  ASAM's:  Six Dimensions of Multidimensional Assessment  Dimension 1:  Acute Intoxication and/or Withdrawal Potential:     Dimension 2:  Biomedical Conditions and Complications:     Dimension 3:  Emotional, Behavioral, or Cognitive Conditions and Complications:     Dimension 4:  Readiness to Change:     Dimension 5:  Relapse, Continued use, or Continued Problem Potential:     Dimension 6:  Recovery/Living Environment:      Substance use Disorder (SUD)    Social Function:  Social Functioning Social Maturity: Responsible Social Judgement: Normal  Stress:  Stress Stressors: Illness(conflicts) Coping Ability: Overwhelmed Patient Takes Medications The Way The Doctor Instructed?: Yes Priority Risk: Low Acuity  Risk Assessment- Self-Harm Potential: Risk Assessment For Self-Harm Potential Thoughts of Self-Harm: No current thoughts Method: No plan Additional Information for  Self-Harm Potential: Previous Attempts Additional Comments for Self-Harm Potential: pt reports over 10 years ago did attempt suicide by gun- pulled trigger and nothing happened.  pt reported life changing and no attempts since.  Risk Assessment -Dangerous to Others Potential: Risk Assessment For Dangerous to Others Potential Method: No Plan Availability of Means: No access or NA  DSM5 Diagnoses: Patient Active Problem List   Diagnosis Date Noted  . Migraine without aura and without status migrainosus, not intractable 08/03/2019  . Current severe episode of major depressive disorder with psychotic features ([place]) 08/03/2019  . Lumbar radiculopathy 06/29/2019  . Right shoulder pain 01/29/2018  . Hyperlipidemia 08/05/2017  . Anxiety and depression 08/05/2017  . Hypertension 08/05/2017  . Diabetes mellitus screening 04/01/2017  . S/P craniotomy 01/27/2016  . Benign meningioma ([place]) 01/27/2016  . CKD (chronic kidney disease) stage 2, GFR 60-89 ml/min 01/18/2016  . Tobacco abuse 01/18/2016  . Seizures ([place])   . GERD (gastroesophageal reflux disease) 04/13/2013    Patient Centered Plan: Patient is on the following Treatment Plan(s):  Anxiety and Depression  Recommendations for Services/Supports/Treatments: Recommendations for Services/Supports/Treatments Recommendations For Services/Supports/Treatments: Individual Therapy, Medication Management  Treatment Plan Summary: OP Treatment Plan Summary: Pt to attend counseling at least biweekly to assist coping w/ depression, anxiety and anger. Pt to f/u in 2 weeks for counseling via webex.  Pt to f/u as scheduled w/ PCP and neurologist.   Jeremy Soto

## 2019-09-06 ENCOUNTER — Ambulatory Visit (INDEPENDENT_AMBULATORY_CARE_PROVIDER_SITE_OTHER): Payer: Medicaid Other | Admitting: Psychology

## 2019-09-06 ENCOUNTER — Other Ambulatory Visit: Payer: Self-pay

## 2019-09-06 DIAGNOSIS — F331 Major depressive disorder, recurrent, moderate: Secondary | ICD-10-CM

## 2019-09-06 NOTE — Progress Notes (Signed)
Virtual Visit via Video Note  I connected with Jeremy Soto on 09/06/19 at  2:30 PM EST by a video enabled telemedicine application and verified that I am speaking with the correct person using two identifiers.   I discussed the limitations of evaluation and management by telemedicine and the availability of in person appointments. The patient expressed understanding and agreed to proceed.   I discussed the assessment and treatment plan with the patient. The patient was provided an opportunity to ask questions and all were answered. The patient agreed with the plan and demonstrated an understanding of the instructions.   The patient was advised to call back or seek an in-person evaluation if the symptoms worsen or if the condition fails to improve as anticipated.  I provided 45 minutes of non-face-to-face time during this encounter.   [name] [facility]    THERAPIST PROGRESS NOTE  Session Time: 2.35pm-3.20pm  Participation Level: Active  Behavioral Response: Well GroomedAlertwnl affect.  mood reported to be irritable  Type of Therapy: Individual Therapy  Treatment Goals addressed: Diagnosis: mDD and goal 1.  Interventions: CBT, Strength-based and Supportive  Summary: Jeremy Soto is a 53 y.o. male who presents with affect wnl.   Counselor called to pt and assisted logging into to webex meeting.  Pt reported that he has been outside today- trying to do some yard work- look for snakes.  Pt reported that he has been easily irritable this week and has recognized need to step away and that is why working outside today.  Helping to burn off energy/agitation.  Pt reported that is recognizes that tries to do things like used to and can't and when others try to assist- upsets.  Pt reported that also daughter asked for help to move out and breakup w/ relationship.  Pt reported will help her move some things out but doesn't want to react to her boyfriend and so will make sure has someone  else to support.  Pt reported on supports that can call on and helpful to him to talk and vent and not react w/ anger.   Suicidal/Homicidal: Nowithout intent/plan  Therapist Response: Assessed pt current functioning per pt report. Processed w/pt coping w/ mood and irritability.  Reflected use of positive coping skills and removing self from situation.  Discussed supports and engaging those supports.   Plan: Return again in 2 weeks., via webex.  Counselor to call and assist if unable to log on.  Pt to f/u w/ PCP re: medication management as scheduled.  Diagnosis: MDD, GAD   [name] [facility] 09/06/2019

## 2019-09-07 ENCOUNTER — Other Ambulatory Visit: Payer: Self-pay | Admitting: Internal Medicine

## 2019-09-07 DIAGNOSIS — M5416 Radiculopathy, lumbar region: Secondary | ICD-10-CM

## 2019-09-13 ENCOUNTER — Other Ambulatory Visit: Payer: Self-pay

## 2019-09-13 ENCOUNTER — Ambulatory Visit: Payer: Medicaid Other | Admitting: Neurology

## 2019-09-13 ENCOUNTER — Encounter: Payer: Self-pay | Admitting: Neurology

## 2019-09-13 VITALS — BP 122/84 | HR 68 | Temp 97.5°F | Ht 75.0 in | Wt 274.3 lb

## 2019-09-13 DIAGNOSIS — D329 Benign neoplasm of meninges, unspecified: Secondary | ICD-10-CM | POA: Diagnosis not present

## 2019-09-13 DIAGNOSIS — R569 Unspecified convulsions: Secondary | ICD-10-CM

## 2019-09-13 DIAGNOSIS — G43009 Migraine without aura, not intractable, without status migrainosus: Secondary | ICD-10-CM

## 2019-09-13 MED ORDER — DIVALPROEX SODIUM 500 MG PO DR TAB: 500.0000 mg | DELAYED_RELEASE_TABLET | Freq: Two times a day (BID) | ORAL | 5 refills | Status: DC

## 2019-09-13 NOTE — Patient Instructions (Signed)
We will start Depakote 500 mg tablets for the seizures and headache.   Depakote (valproic acid) is a seizure medication that also has an FDA approval for migraine headache. The most common potential side effects of this medication include weight gain, tremor, or possible stomach upset. This medication can potentially cause liver problems. If confusion is noted on this medication, contact our office immediately.

## 2019-09-13 NOTE — Progress Notes (Signed)
Reason for visit: Seizures  Referring physician: Dr. Anne Ng is a 53 y.o. male  History of present illness:  [name] is a 53 year old right-handed black male with a history of seizures following a resection of a meningioma in the right frontal area.  The patient has recently had MRI of the brain that shows minimal anterior frontal encephalomalacia.  The patient is on Keppra taking 1000 mg twice daily, he tolerates the drug fairly well.  The patient indicates that he is having 3-4 small seizures a year associated with a sensation coming over the right face, the patient will get confused and feel weak all over and tend to veer to the right when he has an event.  He believes that he may have had 1 generalized seizure over the last year, he cannot member when this was.  The patient also reports daily headache events that usually start in the right occipital area and go forward associated with photophobia and phonophobia and some nausea.  The patient also reports some low back pain, MRI of the lumbar spine done recently has not shown a significant issue structurally with the back.  The patient claims that his balance is off, he may stagger a bit.  He reports numbness on the right side of the body.  He returns to this office for an evaluation.  The patient does not operate a motor vehicle.   Past Medical History:  Diagnosis Date  . Anxiety   . Asthma    as a child  . Brain tumor ([place])   . Constipation   . Depression   . GERD (gastroesophageal reflux disease)   . Head injury due to trauma    dropped chain saw on head- had staples  . Headache   . Peptic ulcer   . Seizure ([place])   . Shortness of breath dyspnea    with exertion   . Sickle cell trait [facility])     Past Surgical History:  Procedure Laterality Date  . CLOSED REDUCTION NASAL FRACTURE N/A 06/04/2014   Procedure: CLOSED REDUCTION NASAL FRACTURE;  Surgeon: Ascencion Dike, MD;  Location: [place];   Service: ENT;  Laterality: N/A;  . CRANIOTOMY Right 01/27/2016   Procedure: RIGHT FRONTAL CRANIOTOMY FOR MENINGIOMA;  Surgeon: Kristeen Miss, MD;  Location: [place] [facility];  Service: Neurosurgery;  Laterality: Right;  RIGHT FRONTAL CRANIOTOMY FOR MENINGIOMA  . DIAGNOSTIC LAPAROSCOPY     - stomach  . ESOPHAGOGASTRODUODENOSCOPY ENDOSCOPY      Family History  Problem Relation Age of Onset  . Diabetes Maternal Grandmother   . Depression Maternal Grandmother   . Diabetes Paternal Grandfather   . Hypertension Paternal Grandfather   . Hypertension Father   . Diabetes Father   . Sickle cell trait Father   . Hypertension Mother   . Diabetes Mother   . Sickle cell trait Mother   . Depression Mother   . Depression Maternal Grandfather   . Psychosis Nephew   . Colon cancer Neg Hx   . Rectal cancer Neg Hx   . Stomach cancer Neg Hx   . Esophageal cancer Neg Hx     Social history:  reports that he has been smoking cigarettes. He has a 5.00 pack-year smoking history. He has never used smokeless tobacco. He reports current alcohol use of about 1.0 standard drinks of alcohol per week. He reports previous drug use.  Medications:  Prior to Admission medications   Medication Sig  Start Date End Date Taking? Authorizing Provider  atorvastatin (LIPITOR) 20 MG tablet Take 1 tablet (20 mg total) by mouth daily. 07/04/19  Yes Ladell Pier, MD  cyclobenzaprine (FLEXERIL) 5 MG tablet TAKE 1 TABLET BY MOUTH THREE TIMES A DAY AS NEEDED FOR MUSCLE SPASMS 09/07/19  Yes Ladell Pier, MD  diclofenac sodium (VOLTAREN) 1 % GEL Apply 2 g topically 4 (four) times daily. 04/01/17  Yes Ladell Pier, MD  HYDROcodone-acetaminophen (NORCO/VICODIN) 5-325 MG tablet Take 1 tablet by mouth every 6 (six) hours as needed for moderate pain.   Yes [provider]  levETIRAcetam (KEPPRA) 1000 MG tablet Take 1 tablet (1,000 mg total) by mouth 2 (two) times daily. 07/04/19  Yes Ladell Pier, MD  traMADol  (ULTRAM) 50 MG tablet Take 1 tablet (50 mg total) by mouth every 8 (eight) hours as needed. 08/03/19  Yes Ladell Pier, MD     No Known Allergies  ROS:  Out of a complete 14 system review of symptoms, the patient complains only of the following symptoms, and all other reviewed systems are negative.  Back pain Walking difficulty Numbness Headache  Blood pressure 122/84, pulse 68, temperature (!) 97.5 F (36.4 C), temperature source Temporal, height 6\' 3"  (1.905 m), weight 274 lb 5 oz (124.4 kg).  Physical Exam  General: The patient is alert and cooperative at the time of the examination.  Eyes: Pupils are equal, round, and reactive to light. Discs are flat bilaterally.  Neck: The neck is supple, no carotid bruits are noted.  Respiratory: The respiratory examination is clear.  Cardiovascular: The cardiovascular examination reveals a regular rate and rhythm, no obvious murmurs or rubs are noted.  Skin: Extremities are without significant edema.  Neurologic Exam  Mental status: The patient is alert and oriented x 3 at the time of the examination. The patient has apparent normal recent and remote memory, with an apparently normal attention span and concentration ability.  Cranial nerves: Facial symmetry is present. There is decreased pinprick and vibration sensation on the right face as compared to the left, the patient splits the midline with vibration sensation on the forehead, decreased on the right. The strength of the facial muscles and the muscles to head turning and shoulder shrug are normal bilaterally. Speech is well enunciated, no aphasia or dysarthria is noted. Extraocular movements are full. Visual fields are full. The tongue is midline, and the patient has symmetric elevation of the soft palate. No obvious hearing deficits are noted.  Motor: The motor testing reveals 5 over 5 strength of all 4 extremities. Good symmetric motor tone is noted throughout.  Sensory:  Sensory testing is notable for decreased pinprick and vibration sensation on the right arm and leg.  Coordination: Cerebellar testing reveals good finger-nose-finger and heel-to-shin bilaterally.  Gait and station: Gait is slightly wide-based, the patient walk independently.  Tandem gait is unsteady but he does not fall.  Romberg is negative but is slightly unsteady.  Reflexes: Deep tendon reflexes are symmetric and normal bilaterally. Toes are downgoing bilaterally.   MRI brain 07/28/19:  IMPRESSION: No acute or significant finding. Old right frontal craniotomy for resection of meningioma. No residual or recurrent disease. Small region of encephalomalacia and gliosis in the medial right frontal lobe, unchanged since 2018.  Few scattered punctate foci of T2 and FLAIR signal within the cerebral hemispheric white matter, not significantly changed. These could be migraine related foci or indicate an early manifestation of small vessel change.  *  MRI scan images were reviewed online. I agree with the written report.   MRI lumbar 07/28/19:  IMPRESSION: Mild bulging discs at L4-5 and L5-S1 but no disc protrusions, spinal or foraminal stenosis.     Assessment/Plan:  1.  History of seizures, poorly controlled  2.  Nonorganic sensory examination  3.  Reports of low back pain  4.  Gait disturbance  The neurologic examination at this time is clearly different from what it was previously when the patient was evaluated in December 2018.  The patient now reports a right-sided sensory deficit that is nonorganic in nature, he has developed a gait disturbance and low back pain.  At least some of these deficits are probably not organic in nature.  The meningioma was resected from the right frontal area which does not correlate with his right-sided symptoms.  Given the ongoing headaches and reported seizures, the patient will be increased on Depakote taking 500 mg twice daily, he will remain  on his current dose of Keppra.  He will follow-up here in 6 months.  Jill Alexanders MD 09/13/2019 9:58 AM  [facility] [address] [place], [place] [address]  Phone [phone] Fax [phone]

## 2019-09-17 ENCOUNTER — Other Ambulatory Visit: Payer: Self-pay

## 2019-09-17 ENCOUNTER — Ambulatory Visit: Payer: Medicaid Other | Attending: Internal Medicine | Admitting: Internal Medicine

## 2019-09-25 ENCOUNTER — Encounter: Payer: Self-pay | Admitting: Internal Medicine

## 2019-10-01 ENCOUNTER — Other Ambulatory Visit: Payer: Self-pay

## 2019-10-01 ENCOUNTER — Ambulatory Visit: Payer: Medicaid Other | Attending: Physical Medicine and Rehabilitation

## 2019-10-01 DIAGNOSIS — M62830 Muscle spasm of back: Secondary | ICD-10-CM | POA: Insufficient documentation

## 2019-10-01 DIAGNOSIS — M2560 Stiffness of unspecified joint, not elsewhere classified: Secondary | ICD-10-CM | POA: Insufficient documentation

## 2019-10-01 DIAGNOSIS — G8929 Other chronic pain: Secondary | ICD-10-CM | POA: Diagnosis present

## 2019-10-01 DIAGNOSIS — M545 Low back pain, unspecified: Secondary | ICD-10-CM

## 2019-10-01 NOTE — Therapy (Signed)
[place], [place], [address] Phone: [phone]   Fax:  [phone]  Physical Therapy Evaluation  Patient Details  Name: Jeremy Soto MRN: [id number] Date of Birth: 08-01-1966 Referring Provider (PT): Jeremy Free, MD   Encounter Date: 10/01/2019  PT End of Session - 10/01/19 1554    Visit Number  1    Number of Visits  13    Date for PT Re-Evaluation  11/09/19    Authorization Type  MCD    PT Start Time  0245    PT Stop Time  0330    PT Time Calculation (min)  45 min    Activity Tolerance  Patient tolerated treatment well;Patient limited by pain    Behavior During Therapy  [facility] for tasks assessed/performed       Past Medical History:  Diagnosis Date  . Anxiety   . Asthma    as a child  . Brain tumor ([place])   . Constipation   . Depression   . GERD (gastroesophageal reflux disease)   . Head injury due to trauma    dropped chain saw on head- had staples  . Headache   . Peptic ulcer   . Seizure ([place])   . Shortness of breath dyspnea    with exertion   . Sickle cell trait [facility])     Past Surgical History:  Procedure Laterality Date  . CLOSED REDUCTION NASAL FRACTURE N/A 06/04/2014   Procedure: CLOSED REDUCTION NASAL FRACTURE;  Surgeon: Ascencion Dike, MD;  Location: [place];  Service: ENT;  Laterality: N/A;  . CRANIOTOMY Right 01/27/2016   Procedure: RIGHT FRONTAL CRANIOTOMY FOR MENINGIOMA;  Surgeon: Kristeen Miss, MD;  Location: [place] [facility];  Service: Neurosurgery;  Laterality: Right;  RIGHT FRONTAL CRANIOTOMY FOR MENINGIOMA  . DIAGNOSTIC LAPAROSCOPY     - stomach  . ESOPHAGOGASTRODUODENOSCOPY ENDOSCOPY      There were no vitals filed for this visit.   Subjective Assessment - 10/01/19 1451    Subjective  He reports chronic LBP .   PAin for 3-4 months .   He reports on backpain prior to this. He reports pain with walking.   MD not sure why has pain.    Currently in Pain?   Yes    Pain Score  3     Pain Location  Back    Pain Orientation  Right;Left;Lower    Pain Descriptors / Indicators  Pressure   pinch   Pain Type  Chronic pain    Pain Radiating Towards  bilateral lateral thiighs to toes    Pain Onset  More than a month ago    Pain Frequency  Constant    Aggravating Factors   walking    Pain Relieving Factors  meds , sit,         [facility] PT Assessment - 10/01/19 0001      Assessment   Medical Diagnosis  chronic back pain    Referring Provider (PT)  Jeremy Free, MD    Onset Date/Surgical Date  --   6 months ago per MD note   Next MD Visit  As needed    Prior Therapy  No      Precautions   Precautions  None      Restrictions   Weight Bearing Restrictions  No      Balance Screen   Has the patient fallen in the past 6 months  Yes    How  many times?  2   walking and lost balnce   Has the patient had a decrease in activity level because of a fear of falling?   Yes    Is the patient reluctant to leave their home because of a fear of falling?   No      Prior Function   Vocation  Unemployed      Cognition   Overall Cognitive Status  Within Functional Limits for tasks assessed      Posture/Postural Control   Posture Comments  shoulder IR       ROM / Strength   AROM / PROM / Strength  AROM;Strength      AROM   AROM Assessment Site  Lumbar    Lumbar Flexion  40    Lumbar Extension  15    Lumbar - Right Side Bend  10    Lumbar - Left Side Bend  10      Strength   Overall Strength Comments  Bilateral LE WNL , abdomen 4/5      Flexibility   Soft Tissue Assessment /Muscle Length  yes    Hamstrings  45-50 degrees passive     Quadriceps  prone 100 degrees bilaterally    ITB  tight on RT       Palpation   Palpation comment  tender from mid thoracic spien to SI and gluteals      Ambulation/Gait   Gait Comments  Stiff , cautious                Objective measurements completed on examination: See above findings.               PT Education - 10/01/19 1455    Education Details  POC    Person(s) Educated  Patient    Methods  Explanation    Comprehension  Verbalized understanding       PT Short Term Goals - 10/01/19 1550      PT SHORT TERM GOAL #1   Title  He will be indpendent with initial hEP.    Baseline  no program    Time  2    Period  Weeks    Status  New      PT SHORT TERM GOAL #2   Title  He will report pain eased 10-20% generally.    Baseline  pain varies from 3/10 to 8/10 dependeing on time of day and medication taken  but is constant    Time  2    Period  Weeks    Status  New        PT Long Term Goals - 10/01/19 1551      PT LONG TERM GOAL #1   Title  He will be indpendent with all issued HEP.    Baseline  indpendent with inityial HEP    Time  6    Period  Weeks    Status  New      PT LONG TERM GOAL #2   Title  He will report pain as intermittant and decreased 50% or more    Baseline  pain constant    Time  6    Period  Weeks    Status  New      PT LONG TERM GOAL #3   Title  He will report able to wake with no pain    Baseline  AM pain worst time of day    Time  6    Period  Weeks    Status  New      PT LONG TERM GOAL #4   Title  He will report able to walk for basic community activity with 1-2 max pain    Baseline  he report pain moderate to sever walking out of home    Time  6    Period  Weeks    Status  New             Plan - 10/01/19 1554    Clinical Impression Statement  [name] reports chronic back and laterla leg pain more on the RT. He reports no injury and gradual onset . He is limtied with all activity on feet.   His ROM is significantly limited and he walks slowly .   He should improve with skilled PT and consisitent HEP    Personal Factors and Comorbidities  Time since onset of injury/illness/exacerbation    Examination-Activity Limitations  Bend;Reach Overhead;Locomotion Level;Sleep;Squat;Stand;Lift     Examination-Participation Restrictions  Community Activity;Driving;Cleaning;Yard Work;Shop    Stability/Clinical Decision Making  Evolving/Moderate complexity    Clinical Decision Making  Moderate    Rehab Potential  Good    PT Frequency  --   3 visits   PT Duration  2 weeks   then 2x/week for 4 weeks   PT Treatment/Interventions  Electrical Stimulation;Moist Heat;Therapeutic exercise;Therapeutic activities;Patient/family education;Manual techniques;Passive range of motion;Dry needling;Taping    PT Next Visit Plan  Manual and modalities , review HEP and add as needed    PT Home Exercise Plan  PPT LTR, diapragmanmatic breathing.    Consulted and Agree with Plan of Care  Patient       Patient will benefit from skilled therapeutic intervention in order to improve the following deficits and impairments:  Decreased range of motion, Difficulty walking, Pain, Increased muscle spasms, Decreased activity tolerance, Decreased strength  Visit Diagnosis: Chronic bilateral low back pain, unspecified whether sciatica present  Muscle spasm of back  Joint stiffness of spine     Problem List Patient Active Problem List   Diagnosis Date Noted  . Migraine without aura and without status migrainosus, not intractable 08/03/2019  . Current severe episode of major depressive disorder with psychotic features ([place]) 08/03/2019  . Lumbar radiculopathy 06/29/2019  . Right shoulder pain 01/29/2018  . Hyperlipidemia 08/05/2017  . Anxiety and depression 08/05/2017  . Hypertension 08/05/2017  . Diabetes mellitus screening 04/01/2017  . S/P craniotomy 01/27/2016  . Benign meningioma ([place]) 01/27/2016  . CKD (chronic kidney disease) stage 2, GFR 60-89 ml/min 01/18/2016  . Tobacco abuse 01/18/2016  . Seizures ([place])   . GERD (gastroesophageal reflux disease) 04/13/2013    Darrel Hoover PT 10/01/2019, 3:59 PM  [facility] [address] [place], [place], [address] Phone: [phone]   Fax:  [phone]  Name: Jeremy Soto MRN: [id number] Date of Birth: January 21, 1966

## 2019-10-01 NOTE — Patient Instructions (Signed)
PPT , LTR, 5-10 reps 5 sec hold , deep breathing diaphragmatic and lower rib expansion  5-10 reps 1-2 sec hold, 1-2x/day

## 2019-10-02 ENCOUNTER — Ambulatory Visit (HOSPITAL_COMMUNITY): Payer: Medicaid Other | Admitting: Psychology

## 2019-10-02 ENCOUNTER — Encounter (HOSPITAL_COMMUNITY): Payer: Self-pay | Admitting: Psychology

## 2019-10-02 NOTE — Progress Notes (Signed)
Jeremy Soto is a 53 y.o. male patient who didn't show for his virtual appointment. Counselor left voicemail message informing can call to office to schedule f/u.Marland Kitchen        [name], [facility]

## 2019-10-09 ENCOUNTER — Other Ambulatory Visit: Payer: Self-pay

## 2019-10-09 ENCOUNTER — Ambulatory Visit: Payer: Medicaid Other | Attending: Physical Medicine and Rehabilitation

## 2019-10-09 DIAGNOSIS — M62830 Muscle spasm of back: Secondary | ICD-10-CM | POA: Insufficient documentation

## 2019-10-09 DIAGNOSIS — M2560 Stiffness of unspecified joint, not elsewhere classified: Secondary | ICD-10-CM | POA: Insufficient documentation

## 2019-10-09 DIAGNOSIS — M545 Low back pain: Secondary | ICD-10-CM | POA: Diagnosis not present

## 2019-10-09 DIAGNOSIS — G8929 Other chronic pain: Secondary | ICD-10-CM | POA: Insufficient documentation

## 2019-10-09 NOTE — Patient Instructions (Signed)
Cat /camel  X 10 reps 1-3x/day,  Child's pose   X 2 1-3x/day 20-30 sec.

## 2019-10-09 NOTE — Therapy (Signed)
[place], [place], [address] Phone: [phone]   Fax:  [phone]  Physical Therapy Treatment  Patient Details  Name: Jeremy Soto MRN: [id number] Date of Birth: 05-01-1966 Referring Provider (PT): Almyra Free, MD   Encounter Date: 10/09/2019  PT End of Session - 10/09/19 1027    Visit Number  2    Number of Visits  13    Date for PT Re-Evaluation  11/09/19    Authorization Type  MCD    Authorization Time Period  12/7/to 10/21/19    Authorization - Visit Number  1    Authorization - Number of Visits  3    PT Start Time  1120    PT Stop Time  1220    PT Time Calculation (min)  60 min    Activity Tolerance  Patient tolerated treatment well;Patient limited by pain    Behavior During Therapy  [facility] for tasks assessed/performed       Past Medical History:  Diagnosis Date  . Anxiety   . Asthma    as a child  . Brain tumor ([place])   . Constipation   . Depression   . GERD (gastroesophageal reflux disease)   . Head injury due to trauma    dropped chain saw on head- had staples  . Headache   . Peptic ulcer   . Seizure ([place])   . Shortness of breath dyspnea    with exertion   . Sickle cell trait [facility])     Past Surgical History:  Procedure Laterality Date  . CLOSED REDUCTION NASAL FRACTURE N/A 06/04/2014   Procedure: CLOSED REDUCTION NASAL FRACTURE;  Surgeon: Ascencion Dike, MD;  Location: [place];  Service: ENT;  Laterality: N/A;  . CRANIOTOMY Right 01/27/2016   Procedure: RIGHT FRONTAL CRANIOTOMY FOR MENINGIOMA;  Surgeon: Kristeen Miss, MD;  Location: [place] [facility];  Service: Neurosurgery;  Laterality: Right;  RIGHT FRONTAL CRANIOTOMY FOR MENINGIOMA  . DIAGNOSTIC LAPAROSCOPY     - stomach  . ESOPHAGOGASTRODUODENOSCOPY ENDOSCOPY      There were no vitals filed for this visit.  Subjective Assessment - 10/09/19 1124    Subjective  Back feel better today.    Pain Score  5     Pain Location  Back    Pain Orientation  Right;Left;Lower    Pain Descriptors / Indicators  Pressure    Pain Type  Chronic pain    Pain Onset  More than a month ago    Pain Frequency  Constant    Aggravating Factors   bending over    Pain Relieving Factors  meds rest                       [facility] Adult PT Treatment/Exercise - 10/09/19 0001      Exercises   Exercises  Lumbar      Lumbar Exercises: Stretches   Single Knee to Chest Stretch  Right;Left;3 reps;20 seconds    Lower Trunk Rotation  3 reps;20 seconds    Lower Trunk Rotation Limitations  RT/LT    Pelvic Tilt  15 reps;5 seconds      Lumbar Exercises: Quadruped   Madcat/Old Horse  10 reps    Other Quadruped Lumbar Exercises  childs's pose 20 sec with cues for techniique      Knee/Hip Exercises: Stretches   Piriformis Stretch  Right;Left;2 reps;20 seconds    Piriformis Stretch Limitations  Figure 4  also x 2 20 sec RT/LT       Manual Therapy   Manual Therapy  Joint mobilization;Soft tissue mobilization    Joint Mobilization  Gr 2-3 PA glides to thoracic and lumbar spine    Soft tissue mobilization  paraspinals and QL         MHP and IFC to lower back ,intensity to tolerance  For decr. pain     PT Education - 10/09/19 1158    Education Details  HEP    Person(s) Educated  Patient    Methods  Explanation;Tactile cues;Verbal cues;Handout    Comprehension  Returned demonstration;Verbalized understanding       PT Short Term Goals - 10/01/19 1550      PT SHORT TERM GOAL #1   Title  He will be indpendent with initial hEP.    Baseline  no program    Time  2    Period  Weeks    Status  New      PT SHORT TERM GOAL #2   Title  He will report pain eased 10-20% generally.    Baseline  pain varies from 3/10 to 8/10 dependeing on time of day and medication taken  but is constant    Time  2    Period  Weeks    Status  New        PT Long Term Goals - 10/01/19 1551      PT LONG TERM GOAL #1    Title  He will be indpendent with all issued HEP.    Baseline  indpendent with inityial HEP    Time  6    Period  Weeks    Status  New      PT LONG TERM GOAL #2   Title  He will report pain as intermittant and decreased 50% or more    Baseline  pain constant    Time  6    Period  Weeks    Status  New      PT LONG TERM GOAL #3   Title  He will report able to wake with no pain    Baseline  AM pain worst time of day    Time  6    Period  Weeks    Status  New      PT LONG TERM GOAL #4   Title  He will report able to walk for basic community activity with 1-2 max pain    Baseline  he report pain moderate to sever walking out of home    Time  6    Period  Weeks    Status  New            Plan - 10/09/19 1027    Clinical Impression Statement  subjective improvement and tolerated all exercises but with pain.   He is compliant with HEP. Still very tender to manual.    PT Treatment/Interventions  Electrical Stimulation;Moist Heat;Therapeutic exercise;Therapeutic activities;Patient/family education;Manual techniques;Passive range of motion;Dry needling;Taping    PT Next Visit Plan  Manual and modalities , review HEP and add as needed    PT Home Exercise Plan  PPT LTR, diapragmanmatic breathing.   car/camel,    childs pose    Consulted and Agree with Plan of Care  Patient       Patient will benefit from skilled therapeutic intervention in order to improve the following deficits and impairments:  Decreased range of motion, Difficulty walking, Pain, Increased muscle spasms, Decreased  activity tolerance, Decreased strength  Visit Diagnosis: Chronic bilateral low back pain, unspecified whether sciatica present  Muscle spasm of back  Joint stiffness of spine     Problem List Patient Active Problem List   Diagnosis Date Noted  . Migraine without aura and without status migrainosus, not intractable 08/03/2019  . Current severe episode of major depressive disorder with psychotic  features ([place]) 08/03/2019  . Lumbar radiculopathy 06/29/2019  . Right shoulder pain 01/29/2018  . Hyperlipidemia 08/05/2017  . Anxiety and depression 08/05/2017  . Hypertension 08/05/2017  . Diabetes mellitus screening 04/01/2017  . S/P craniotomy 01/27/2016  . Benign meningioma ([place]) 01/27/2016  . CKD (chronic kidney disease) stage 2, GFR 60-89 ml/min 01/18/2016  . Tobacco abuse 01/18/2016  . Seizures ([place])   . GERD (gastroesophageal reflux disease) 04/13/2013    Jeremy Soto  PT 10/09/2019, 12:03 PM  [facility] [address] [place], [place], [address] Phone: [phone]   Fax:  [phone]  Name: Jeremy Soto MRN: [id number] Date of Birth: [birth year]

## 2019-10-10 ENCOUNTER — Telehealth (INDEPENDENT_AMBULATORY_CARE_PROVIDER_SITE_OTHER): Payer: Self-pay | Admitting: Licensed Clinical Social Worker

## 2019-10-10 NOTE — Telephone Encounter (Signed)
Follow up call placed to patient. LCSW left message requesting a return call.  

## 2019-10-11 ENCOUNTER — Ambulatory Visit (AMBULATORY_SURGERY_CENTER): Payer: Medicaid Other

## 2019-10-11 ENCOUNTER — Other Ambulatory Visit: Payer: Self-pay

## 2019-10-11 VITALS — Temp 96.9°F | Ht 75.0 in | Wt 277.0 lb

## 2019-10-11 DIAGNOSIS — Z1159 Encounter for screening for other viral diseases: Secondary | ICD-10-CM

## 2019-10-11 DIAGNOSIS — Z1211 Encounter for screening for malignant neoplasm of colon: Secondary | ICD-10-CM

## 2019-10-11 MED ORDER — NA SULFATE-K SULFATE-MG SULF 17.5-3.13-1.6 GM/177ML PO SOLN: 1.0000 | Freq: Once | ORAL | 0 refills | Status: AC

## 2019-10-11 NOTE — Progress Notes (Signed)
Denies allergies to eggs or soy products. Denies complication of anesthesia or sedation. Denies use of weight loss medication. Denies use of O2.   Emmi instructions given for colonoscopy.  Covid screening is scheduled for Friday 10/19/19 @ 2:40 Pm.

## 2019-10-16 ENCOUNTER — Ambulatory Visit: Payer: Medicaid Other

## 2019-10-16 ENCOUNTER — Other Ambulatory Visit: Payer: Self-pay

## 2019-10-16 DIAGNOSIS — M62830 Muscle spasm of back: Secondary | ICD-10-CM

## 2019-10-16 DIAGNOSIS — G8929 Other chronic pain: Secondary | ICD-10-CM

## 2019-10-16 DIAGNOSIS — M2560 Stiffness of unspecified joint, not elsewhere classified: Secondary | ICD-10-CM

## 2019-10-16 DIAGNOSIS — M545 Low back pain, unspecified: Secondary | ICD-10-CM

## 2019-10-16 NOTE — Therapy (Signed)
[place], [place], [address] Phone: [phone]   Fax:  [phone]  Physical Therapy Treatment  Patient Details  Name: Jeremy Soto MRN: [id number] Date of Birth: [birth year] Referring Provider (PT): Almyra Free, MD   Encounter Date: 10/16/2019  PT End of Session - 10/16/19 1351    Visit Number  3    Number of Visits  13    Date for PT Re-Evaluation  11/09/19    Authorization Type  MCD    Authorization Time Period  12/7/to 10/21/19    Authorization - Visit Number  2    Authorization - Number of Visits  3    PT Start Time  1250    PT Stop Time  1345    PT Time Calculation (min)  55 min    Activity Tolerance  Patient tolerated treatment well;Patient limited by pain    Behavior During Therapy  [facility] for tasks assessed/performed       Past Medical History:  Diagnosis Date  . Anxiety   . Arthritis   . Asthma    as a child  . Brain tumor ([place])   . Cancer [facility])    brain tumor  . Chronic kidney disease   . Constipation   . Depression   . GERD (gastroesophageal reflux disease)   . Head injury due to trauma    dropped chain saw on head- had staples  . Headache   . Hyperlipidemia   . Peptic ulcer   . Seizure ([place])   . Shortness of breath dyspnea    with exertion   . Sickle cell trait ([place])   . Sleep apnea   . Stroke ([place])   . Substance abuse [facility])     Past Surgical History:  Procedure Laterality Date  . CLOSED REDUCTION NASAL FRACTURE N/A 06/04/2014   Procedure: CLOSED REDUCTION NASAL FRACTURE;  Surgeon: Ascencion Dike, MD;  Location: [place];  Service: ENT;  Laterality: N/A;  . CRANIOTOMY Right 01/27/2016   Procedure: RIGHT FRONTAL CRANIOTOMY FOR MENINGIOMA;  Surgeon: Kristeen Miss, MD;  Location: [place] [facility];  Service: Neurosurgery;  Laterality: Right;  RIGHT FRONTAL CRANIOTOMY FOR MENINGIOMA  . DIAGNOSTIC LAPAROSCOPY     - stomach  . ESOPHAGOGASTRODUODENOSCOPY ENDOSCOPY       There were no vitals filed for this visit.  Subjective Assessment - 10/16/19 1352    Subjective  Doing well . Pain not bad Took medication today. pain7/10    Pain Score  7     Pain Location  Back    Pain Orientation  Right;Left;Lower    Pain Descriptors / Indicators  Pressure    Pain Type  Chronic pain    Pain Onset  More than a month ago    Pain Frequency  Constant    Aggravating Factors   bedning    Pain Relieving Factors  meds Abran Cantor                       [facility] Adult PT Treatment/Exercise - 10/16/19 0001      Ambulation/Gait   Gait Comments  smooth no esitation.       Lumbar Exercises: Stretches   Single Knee to Chest Stretch  Right;Left;2 reps;30 seconds    Lower Trunk Rotation  3 reps;20 seconds    Lower Trunk Rotation Limitations  RT/LT    Pelvic Tilt  15 reps;5 seconds    Other Lumbar Stretch Exercise  trunk fllexion at free motion x2 5 breaths then side stretch sitting over red ball 5 breath RT and LT       Lumbar Exercises: Aerobic   Nustep  L5 UE/LE 5 min      Lumbar Exercises: Supine   Bent Knee Raise  10 reps    Bent Knee Raise Limitations  RT/LT    Bridge with Ball Squeeze  15 reps;1 second      Lumbar Exercises: Quadruped   Madcat/Old Horse  15 reps    Other Quadruped Lumbar Exercises  childs's pose 20 sec with cues for techniique      Knee/Hip Exercises: Stretches   Piriformis Stretch  Right;Left;2 reps;20 seconds    Piriformis Stretch Limitations  Figure 4  also x 2 20 sec RT/LT       Modalities   Modalities  Moist Heat;Electrical Stimulation      Moist Heat Therapy   Number Minutes Moist Heat  15 Minutes    Moist Heat Location  Lumbar Spine      Electrical Stimulation   Electrical Stimulation Location  lumbar    Electrical Stimulation Action  :IFC    Electrical Stimulation Parameters  to tolerance    Electrical Stimulation Goals  Pain      Manual Therapy   Joint Mobilization  Gr 2-3 PA glides to thoracic and lumbar spine     Soft tissue mobilization  paraspinals and QL               PT Short Term Goals - 10/01/19 1550      PT SHORT TERM GOAL #1   Title  He will be indpendent with initial hEP.    Baseline  no program    Time  2    Period  Weeks    Status  New      PT SHORT TERM GOAL #2   Title  He will report pain eased 10-20% generally.    Baseline  pain varies from 3/10 to 8/10 dependeing on time of day and medication taken  but is constant    Time  2    Period  Weeks    Status  New        PT Long Term Goals - 10/01/19 1551      PT LONG TERM GOAL #1   Title  He will be indpendent with all issued HEP.    Baseline  indpendent with inityial HEP    Time  6    Period  Weeks    Status  New      PT LONG TERM GOAL #2   Title  He will report pain as intermittant and decreased 50% or more    Baseline  pain constant    Time  6    Period  Weeks    Status  New      PT LONG TERM GOAL #3   Title  He will report able to wake with no pain    Baseline  AM pain worst time of day    Time  6    Period  Weeks    Status  New      PT LONG TERM GOAL #4   Title  He will report able to walk for basic community activity with 1-2 max pain    Baseline  he report pain moderate to sever walking out of home    Time  6    Period  Weeks    Status  New            Plan - 10/16/19 1352    Clinical Impression Statement  Movement today is much smoother with less hesitation.   Pain decreased overall per pt.  He was able to do his HEP    PT Treatment/Interventions  Electrical Stimulation;Moist Heat;Therapeutic exercise;Therapeutic activities;Patient/family education;Manual techniques;Passive range of motion;Dry needling;Taping    PT Next Visit Plan  Manual and modalities , review HEP and add as needed    PT Home Exercise Plan  PPT LTR, diapragmanmatic breathing.   car/camel,    childs pose    Consulted and Agree with Plan of Care  Patient       Patient will benefit from skilled therapeutic  intervention in order to improve the following deficits and impairments:  Decreased range of motion, Difficulty walking, Pain, Increased muscle spasms, Decreased activity tolerance, Decreased strength  Visit Diagnosis: Chronic bilateral low back pain, unspecified whether sciatica present  Muscle spasm of back  Joint stiffness of spine     Problem List Patient Active Problem List   Diagnosis Date Noted  . Migraine without aura and without status migrainosus, not intractable 08/03/2019  . Current severe episode of major depressive disorder with psychotic features ([place]) 08/03/2019  . Lumbar radiculopathy 06/29/2019  . Right shoulder pain 01/29/2018  . Hyperlipidemia 08/05/2017  . Anxiety and depression 08/05/2017  . Hypertension 08/05/2017  . Diabetes mellitus screening 04/01/2017  . S/P craniotomy 01/27/2016  . Benign meningioma ([place]) 01/27/2016  . CKD (chronic kidney disease) stage 2, GFR 60-89 ml/min 01/18/2016  . Tobacco abuse 01/18/2016  . Seizures ([place])   . GERD (gastroesophageal reflux disease) 04/13/2013    Darrel Hoover  PT 10/16/2019, 2:36 PM  [facility] [address] [place], [place], [address] Phone: [phone]   Fax:  [phone]  Name: Jeremy Soto MRN: [id number] Date of Birth: 1966/08/05

## 2019-10-18 ENCOUNTER — Other Ambulatory Visit: Payer: Self-pay

## 2019-10-18 ENCOUNTER — Ambulatory Visit: Payer: Medicaid Other

## 2019-10-18 DIAGNOSIS — G8929 Other chronic pain: Secondary | ICD-10-CM

## 2019-10-18 DIAGNOSIS — M62830 Muscle spasm of back: Secondary | ICD-10-CM

## 2019-10-18 DIAGNOSIS — M545 Low back pain, unspecified: Secondary | ICD-10-CM

## 2019-10-18 DIAGNOSIS — M2560 Stiffness of unspecified joint, not elsewhere classified: Secondary | ICD-10-CM

## 2019-10-18 NOTE — Therapy (Signed)
[place], [place], [address] Phone: [phone]   Fax:  [phone]  Physical Therapy Treatment  Patient Details  Name: Jeremy Soto MRN: [id number] Date of Birth: Jan 19, 1966 Referring Provider (PT): Almyra Free, MD   Encounter Date: 10/18/2019  PT End of Session - 10/18/19 1220    Visit Number  4    Number of Visits  13    Date for PT Re-Evaluation  11/30/19    Authorization Type  MCD    Authorization Time Period  12/7/to 10/21/19    Authorization - Visit Number  3    Authorization - Number of Visits  3    PT Start Time  1217    PT Stop Time  1315    PT Time Calculation (min)  58 min    Activity Tolerance  Patient tolerated treatment well;Patient limited by pain    Behavior During Therapy  [facility] for tasks assessed/performed       Past Medical History:  Diagnosis Date  . Anxiety   . Arthritis   . Asthma    as a child  . Brain tumor ([place])   . Cancer [facility])    brain tumor  . Chronic kidney disease   . Constipation   . Depression   . GERD (gastroesophageal reflux disease)   . Head injury due to trauma    dropped chain saw on head- had staples  . Headache   . Hyperlipidemia   . Peptic ulcer   . Seizure ([place])   . Shortness of breath dyspnea    with exertion   . Sickle cell trait ([place])   . Sleep apnea   . Stroke ([place])   . Substance abuse [facility])     Past Surgical History:  Procedure Laterality Date  . CLOSED REDUCTION NASAL FRACTURE N/A 06/04/2014   Procedure: CLOSED REDUCTION NASAL FRACTURE;  Surgeon: Ascencion Dike, MD;  Location: [place];  Service: ENT;  Laterality: N/A;  . CRANIOTOMY Right 01/27/2016   Procedure: RIGHT FRONTAL CRANIOTOMY FOR MENINGIOMA;  Surgeon: Kristeen Miss, MD;  Location: [place] [facility];  Service: Neurosurgery;  Laterality: Right;  RIGHT FRONTAL CRANIOTOMY FOR MENINGIOMA  . DIAGNOSTIC LAPAROSCOPY     - stomach  . ESOPHAGOGASTRODUODENOSCOPY ENDOSCOPY       There were no vitals filed for this visit.  Subjective Assessment - 10/18/19 1317    Subjective  Im' bewtter and doing more at home Still need meds.    Pain Score  5     Pain Location  Back    Pain Orientation  Right;Left;Lower    Pain Descriptors / Indicators  Pressure    Pain Type  Chronic pain    Pain Onset  More than a month ago    Pain Frequency  Constant    Aggravating Factors   bending    Pain Relieving Factors  meds rest         [facility] PT Assessment - 10/18/19 0001      AROM   Lumbar Flexion  80                   [facility] Adult PT Treatment/Exercise - 10/18/19 0001      Lumbar Exercises: Aerobic   Nustep  L5 UE/LE 7  min      Lumbar Exercises: Supine   Bridge  15 reps      Lumbar Exercises: Sidelying   Clam  Right;Left;15 reps  Other Sidelying Lumbar Exercises  knee to chest bilaterally, arm openers RT and LT  all above voe HEP      Electrical Stimulation   Electrical Stimulation Location  lumbar    Electrical Stimulation Action  IFC    Electrical Stimulation Parameters  to tolerance    Electrical Stimulation Goals  Pain      Manual Therapy   Joint Mobilization  Gr 2-3-4 thoracic and lumbar.  and bilateral hip IR and knee flexion stretching    Soft tissue mobilization  paraspinals and QL             PT Education - 10/18/19 1315    Education Details  HEP    Person(s) Educated  Patient    Methods  Explanation;Tactile cues;Verbal cues;Handout    Comprehension  Returned demonstration;Verbalized understanding       PT Short Term Goals - 10/18/19 1317      PT SHORT TERM GOAL #1   Title  He will be indpendent with initial hEP.    Status  Achieved      PT SHORT TERM GOAL #2   Title  He will report pain eased 10-20% generally.    Status  Achieved        PT Long Term Goals - 10/01/19 1551      PT LONG TERM GOAL #1   Title  He will be indpendent with all issued HEP.    Baseline  indpendent with inityial HEP    Time  6     Period  Weeks    Status  New      PT LONG TERM GOAL #2   Title  He will report pain as intermittant and decreased 50% or more    Baseline  pain constant    Time  6    Period  Weeks    Status  New      PT LONG TERM GOAL #3   Title  He will report able to wake with no pain    Baseline  AM pain worst time of day    Time  6    Period  Weeks    Status  New      PT LONG TERM GOAL #4   Title  He will report able to walk for basic community activity with 1-2 max pain    Baseline  he report pain moderate to sever walking out of home    Time  6    Period  Weeks    Status  New            Plan - 10/18/19 1220    Clinical Impression Statement  He is much improved though pain levels still moderate . He is able to demo all HEP correctly. He should improve with futher skilled PT. STG met    PT Frequency  2x / week    PT Duration  4 weeks    PT Treatment/Interventions  Electrical Stimulation;Moist Heat;Therapeutic exercise;Therapeutic activities;Patient/family education;Manual techniques;Passive range of motion;Dry needling;Taping    PT Next Visit Plan  Manual and modalities , review HEP and add as needed    PT Home Exercise Plan  PPT LTR, diapragmanmatic breathing.   car/camel,    childs pose, knee to chest, bridge , clam    Consulted and Agree with Plan of Care  Patient       Patient will benefit from skilled therapeutic intervention in order to improve the following deficits and impairments:  Decreased range of  motion, Difficulty walking, Pain, Increased muscle spasms, Decreased activity tolerance, Decreased strength  Visit Diagnosis: Chronic bilateral low back pain, unspecified whether sciatica present  Muscle spasm of back  Joint stiffness of spine     Problem List Patient Active Problem List   Diagnosis Date Noted  . Migraine without aura and without status migrainosus, not intractable 08/03/2019  . Current severe episode of major depressive disorder with psychotic  features ([place]) 08/03/2019  . Lumbar radiculopathy 06/29/2019  . Right shoulder pain 01/29/2018  . Hyperlipidemia 08/05/2017  . Anxiety and depression 08/05/2017  . Hypertension 08/05/2017  . Diabetes mellitus screening 04/01/2017  . S/P craniotomy 01/27/2016  . Benign meningioma ([place]) 01/27/2016  . CKD (chronic kidney disease) stage 2, GFR 60-89 ml/min 01/18/2016  . Tobacco abuse 01/18/2016  . Seizures ([place])   . GERD (gastroesophageal reflux disease) 04/13/2013    Darrel Hoover  PT 10/18/2019, 1:31 PM  [facility] [address] [place], [place], [address] Phone: [phone]   Fax:  [phone]  Name: Jeremy Soto MRN: [id number] Date of Birth: 12-30-1965

## 2019-10-19 ENCOUNTER — Ambulatory Visit (INDEPENDENT_AMBULATORY_CARE_PROVIDER_SITE_OTHER): Payer: Medicaid Other

## 2019-10-19 DIAGNOSIS — Z1159 Encounter for screening for other viral diseases: Secondary | ICD-10-CM

## 2019-10-22 ENCOUNTER — Other Ambulatory Visit: Payer: Self-pay | Admitting: Internal Medicine

## 2019-10-22 ENCOUNTER — Ambulatory Visit (INDEPENDENT_AMBULATORY_CARE_PROVIDER_SITE_OTHER): Payer: Medicaid Other

## 2019-10-22 DIAGNOSIS — Z1159 Encounter for screening for other viral diseases: Secondary | ICD-10-CM

## 2019-10-23 LAB — SARS CORONAVIRUS 2 (TAT 6-24 HRS): SARS Coronavirus 2: NEGATIVE

## 2019-10-24 ENCOUNTER — Ambulatory Visit (AMBULATORY_SURGERY_CENTER): Payer: Medicaid Other | Admitting: Internal Medicine

## 2019-10-24 ENCOUNTER — Encounter: Payer: Self-pay | Admitting: Internal Medicine

## 2019-10-24 ENCOUNTER — Other Ambulatory Visit: Payer: Self-pay

## 2019-10-24 ENCOUNTER — Encounter: Payer: Medicaid Other | Admitting: Internal Medicine

## 2019-10-24 VITALS — BP 135/85 | HR 81 | Temp 98.0°F | Resp 17 | Ht 75.0 in | Wt 277.0 lb

## 2019-10-24 DIAGNOSIS — Z1211 Encounter for screening for malignant neoplasm of colon: Secondary | ICD-10-CM

## 2019-10-24 DIAGNOSIS — D128 Benign neoplasm of rectum: Secondary | ICD-10-CM

## 2019-10-24 DIAGNOSIS — D129 Benign neoplasm of anus and anal canal: Secondary | ICD-10-CM

## 2019-10-24 DIAGNOSIS — K621 Rectal polyp: Secondary | ICD-10-CM | POA: Diagnosis not present

## 2019-10-24 MED ORDER — SODIUM CHLORIDE 0.9 % IV SOLN: 500.0000 mL | Freq: Once | INTRAVENOUS | Status: DC

## 2019-10-24 NOTE — Progress Notes (Signed)
Patient woke up and was hurting. Abd distention noted.  Tried r and l sides and up on all fours not working.  Patient had levsin and it didn't help. Patient to bathroom unable to pass gas.  Up walking hall with the nurse. Passing gas minutely.  Patient walked and he stated that he felt much better and had no pain.

## 2019-10-24 NOTE — Op Note (Signed)
[place] Patient Name: Jeremy Soto Procedure Date: 10/24/2019 1:12 PM MRN: [id number] Endoscopist: Jerene Bears , MD Age: 53 Referring MD:  Date of Birth: 1966/09/27 Gender: Male Account #: [id number] Procedure:                Colonoscopy Indications:              Screening for colorectal malignant neoplasm, This                            is the patient's first colonoscopy Medicines:                Monitored Anesthesia Care Procedure:                Pre-Anesthesia Assessment:                           - Prior to the procedure, a History and Physical                            was performed, and patient medications and                            allergies were reviewed. The patient's tolerance of                            previous anesthesia was also reviewed. The risks                            and benefits of the procedure and the sedation                            options and risks were discussed with the patient.                            All questions were answered, and informed consent                            was obtained. Prior Anticoagulants: The patient has                            taken no previous anticoagulant or antiplatelet                            agents. ASA Grade Assessment: III - A patient with                            severe systemic disease. After reviewing the risks                            and benefits, the patient was deemed in                            satisfactory condition to undergo the procedure.  After obtaining informed consent, the colonoscope                            was passed under direct vision. Throughout the                            procedure, the patient's blood pressure, pulse, and                            oxygen saturations were monitored continuously. The                            Colonoscope was introduced through the anus and                            advanced to the cecum,  identified by appendiceal                            orifice and ileocecal valve. The colonoscopy was                            performed without difficulty. The patient tolerated                            the procedure well. The quality of the bowel                            preparation was good. The ileocecal valve,                            appendiceal orifice, and rectum were photographed. Scope In: 2:39:53 PM Scope Out: 3:06:03 PM Scope Withdrawal Time: 0 hours 15 minutes 6 seconds  Total Procedure Duration: 0 hours 26 minutes 10 seconds  Findings:                 The digital rectal exam was normal.                           A 6 mm polyp was found in the rectum. The polyp was                            pedunculated. The polyp was removed with a cold                            snare. Resection and retrieval were complete.                           The exam was otherwise without abnormality on                            direct and retroflexion views. Complications:            No immediate complications. Estimated Blood Loss:     Estimated blood loss was minimal. Impression:               -  One 6 mm polyp in the rectum, removed with a cold                            snare. Resected and retrieved.                           - The examination was otherwise normal on direct                            and retroflexion views. Recommendation:           - Patient has a contact number available for                            emergencies. The signs and symptoms of potential                            delayed complications were discussed with the                            patient. Return to normal activities tomorrow.                            Written discharge instructions were provided to the                            patient.                           - Resume previous diet.                           - Continue present medications.                           - Await pathology results.                            - Repeat colonoscopy is recommended for                            surveillance. The colonoscopy date will be                            determined after pathology results from today's                            exam become available for review. Jerene Bears, MD 10/24/2019 3:08:50 PM This report has been signed electronically.

## 2019-10-24 NOTE — Patient Instructions (Addendum)
Read all of the handouts given to you by your recovery room nurse.  Thank-you for choosing [place] for your healthcare needs today.  YOU HAD AN ENDOSCOPIC PROCEDURE TODAY AT [facility]:   Refer to the procedure report that was given to you for any specific questions about what was found during the examination.  If the procedure report does not answer your questions, please call your gastroenterologist to clarify.  If you requested that your care partner not be given the details of your procedure findings, then the procedure report has been included in a sealed envelope for you to review at your convenience later.  YOU SHOULD EXPECT: Some feelings of bloating in the abdomen. Passage of more gas than usual.  Walking can help get rid of the air that was put into your GI tract during the procedure and reduce the bloating. If you had a lower endoscopy (such as a colonoscopy or flexible sigmoidoscopy) you may notice spotting of blood in your stool or on the toilet paper. If you underwent a bowel prep for your procedure, you may not have a normal bowel movement for a few days.  Please Note:  You might notice some irritation and congestion in your nose or some drainage.  This is from the oxygen used during your procedure.  There is no need for concern and it should clear up in a day or so.  SYMPTOMS TO REPORT IMMEDIATELY:   Following lower endoscopy (colonoscopy or flexible sigmoidoscopy):  Excessive amounts of blood in the stool  Significant tenderness or worsening of abdominal pains  Swelling of the abdomen that is new, acute  Fever of 100F or higher   For urgent or emergent issues, a gastroenterologist can be reached at any hour by calling [phone].   DIET:  We do recommend a small meal at first, but then you may proceed to your regular diet.  Drink plenty of fluids but you should avoid alcoholic beverages for 24 hours. Avoid gassy foods today.  ACTIVITY:  You should plan to  take it easy for the rest of today and you should NOT DRIVE or use heavy machinery until tomorrow (because of the sedation medicines used during the test).    FOLLOW UP: Our staff will call the number listed on your records 48-72 hours following your procedure to check on you and address any questions or concerns that you may have regarding the information given to you following your procedure. If we do not reach you, we will leave a message.  We will attempt to reach you two times.  During this call, we will ask if you have developed any symptoms of COVID 19. If you develop any symptoms (ie: fever, flu-like symptoms, shortness of breath, cough etc.) before then, please call [phone].  If you test positive for Covid 19 in the 2 weeks post procedure, please call and report this information to [place].    If any biopsies were taken you will be contacted by phone or by letter within the next 1-3 weeks.  Please call us at [phone] if you have not heard about the biopsies in 3 weeks.    SIGNATURES/CONFIDENTIALITY: You and/or your care partner have signed paperwork which will be entered into your electronic medical record.  These signatures attest to the fact that that the information above on your After Visit Summary has been reviewed and is understood.  Full responsibility of the confidentiality of this discharge information lies with you and/or your  care-partner.

## 2019-10-24 NOTE — Progress Notes (Signed)
PT taken to PACU. Monitors in place. VSS. Report given to RN. 

## 2019-10-24 NOTE — Progress Notes (Signed)
Called to room to assist during endoscopic procedure.  Patient ID and intended procedure confirmed with present staff. Received instructions for my participation in the procedure from the performing physician.  

## 2019-10-24 NOTE — Progress Notes (Signed)
Temp JB VS DT  Pt's states no medical or surgical changes since previsit or office visit. 

## 2019-10-29 ENCOUNTER — Telehealth: Payer: Self-pay | Admitting: *Deleted

## 2019-10-29 NOTE — Telephone Encounter (Signed)
  Follow up Call-  Call back number 10/24/2019  Post procedure Call Back phone  # [phone]  Permission to leave phone message Yes  Some recent data might be hidden     Patient questions:  Do you have a fever, pain , or abdominal swelling? No. Pain Score  0 *  Have you tolerated food without any problems? Yes.    Have you been able to return to your normal activities? Yes.    Do you have any questions about your discharge instructions: Diet   No. Medications  No. Follow up visit  No.  Do you have questions or concerns about your Care? No.  Actions: * If pain score is 4 or above: No action needed, pain <4.  1. Have you developed a fever since your procedure? no  2.   Have you had an respiratory symptoms (SOB or cough) since your procedure? no  3.   Have you tested positive for COVID 19 since your procedure no  4.   Have you had any family members/close contacts diagnosed with the COVID 19 since your procedure?  no   If yes to any of these questions please route to Joylene John, RN and Alphonsa Gin, Therapist, sports.

## 2019-11-01 ENCOUNTER — Encounter

## 2019-11-01 ENCOUNTER — Other Ambulatory Visit: Payer: Self-pay

## 2019-11-01 ENCOUNTER — Ambulatory Visit (HOSPITAL_COMMUNITY): Payer: Medicaid Other | Admitting: Psychology

## 2019-11-01 ENCOUNTER — Ambulatory Visit: Payer: Medicaid Other

## 2019-11-01 DIAGNOSIS — M62830 Muscle spasm of back: Secondary | ICD-10-CM

## 2019-11-01 DIAGNOSIS — G8929 Other chronic pain: Secondary | ICD-10-CM

## 2019-11-01 DIAGNOSIS — M2560 Stiffness of unspecified joint, not elsewhere classified: Secondary | ICD-10-CM

## 2019-11-01 DIAGNOSIS — M545 Low back pain: Secondary | ICD-10-CM | POA: Diagnosis not present

## 2019-11-01 NOTE — Therapy (Signed)
[place], [place], [address] Phone: [phone]   Fax:  [phone]  Physical Therapy Treatment  Patient Details  Name: Jeremy Soto MRN: [id number] Date of Birth: 12-31-1965 Referring Provider (PT): Almyra Free, MD   Encounter Date: 11/01/2019  PT End of Session - 11/01/19 1211    Visit Number  5    Number of Visits  13    Date for PT Re-Evaluation  11/30/19    Authorization Type  MCD    Authorization Time Period  12/7/to 10/21/19    Authorization - Visit Number  1    Authorization - Number of Visits  1    PT Start Time  1206    PT Stop Time  1300    PT Time Calculation (min)  54 min    Activity Tolerance  Patient tolerated treatment well;Patient limited by pain    Behavior During Therapy  [facility] for tasks assessed/performed       Past Medical History:  Diagnosis Date  . Anxiety   . Arthritis   . Asthma    as a child  . Brain tumor ([place])   . Cancer [facility])    brain tumor  . Chronic kidney disease   . Constipation   . Depression   . GERD (gastroesophageal reflux disease)   . Head injury due to trauma    dropped chain saw on head- had staples  . Headache   . Hyperlipidemia   . Peptic ulcer   . Seizure ([place])   . Shortness of breath dyspnea    with exertion   . Sickle cell trait ([place])   . Sleep apnea   . Stroke ([place])   . Substance abuse [facility])     Past Surgical History:  Procedure Laterality Date  . CLOSED REDUCTION NASAL FRACTURE N/A 06/04/2014   Procedure: CLOSED REDUCTION NASAL FRACTURE;  Surgeon: Ascencion Dike, MD;  Location: [place];  Service: ENT;  Laterality: N/A;  . CRANIOTOMY Right 01/27/2016   Procedure: RIGHT FRONTAL CRANIOTOMY FOR MENINGIOMA;  Surgeon: Kristeen Miss, MD;  Location: [place] [facility]UR[facility]S;  Service: Neurosurgery;  Laterality: Right;  RIGHT FRONTAL CRANIOTOMY FOR MENINGIOMA  . DIAGNOSTIC LAPAROSCOPY     - stomach  . ESOPHAGOGASTRODUODENOSCOPY ENDOSCOPY       There were no vitals filed for this visit.  Subjective Assessment - 11/01/19 1209    Subjective  Do exercise on floor.  5/10 pain today . Try to keep it loose.    He feels he is better since start of PT    Pain Score  5     Pain Location  Back    Pain Orientation  Right;Left;Lower    Pain Descriptors / Indicators  Pressure    Pain Type  Chronic pain    Pain Radiating Towards  lateral thighs    Pain Onset  More than a month ago    Pain Frequency  Constant    Aggravating Factors   bend    Pain Relieving Factors  rest, meds         [facility] PT Assessment - 11/01/19 0001      AROM   Lumbar Flexion  80    Lumbar - Right Side Bend  15    Lumbar - Left Side Bend  15      Strength   Overall Strength Comments  Bilateral LE WNL , abdomen 4/5      Flexibility   Hamstrings  45-50 degrees passive       Ambulation/Gait   Gait Comments  smooth no hesitation.                    [place] Adult PT Treatment/Exercise - 11/01/19 0001      Lumbar Exercises: Stretches   Single Knee to Chest Stretch  Right;Left;2 reps;30 seconds    Lower Trunk Rotation  3 reps;20 seconds    Pelvic Tilt  15 reps;5 seconds      Lumbar Exercises: Aerobic   Nustep  L5 UE/LE 6  min      Lumbar Exercises: Supine   Bent Knee Raise  10 reps    Bent Knee Raise Limitations  RT/LT    Bridge  15 reps      Lumbar Exercises: Sidelying   Clam  Right;Left;15 reps    Other Sidelying Lumbar Exercises  knee to chest bilaterally, arm openers RT and LT       Moist Heat Therapy   Number Minutes Moist Heat  15 Minutes    Moist Heat Location  Lumbar Spine      Electrical Stimulation   Electrical Stimulation Location  lumbar    Electrical Stimulation Action  IFC    Electrical Stimulation Parameters  to tolerance    Electrical Stimulation Goals  Pain      Manual Therapy   Joint Mobilization  Gr 2-3-4 thoracic and lumbar.  and bilateral hip IR and knee flexion stretching    Soft tissue mobilization   paraspinals and QL               PT Short Term Goals - 11/01/19 1212      PT SHORT TERM GOAL #1   Title  He will be indpendent with initial hEP.    Status  Achieved      PT SHORT TERM GOAL #2   Title  He will report pain eased 10-20% generally.    Baseline  overall pain 20% better or more Still takes meds    Status  Achieved        PT Long Term Goals - 10/01/19 1551      PT LONG TERM GOAL #1   Title  He will be indpendent with all issued HEP.    Baseline  indpendent with inityial HEP    Time  6    Period  Weeks    Status  New      PT LONG TERM GOAL #2   Title  He will report pain as intermittant and decreased 50% or more    Baseline  pain constant    Time  6    Period  Weeks    Status  New      PT LONG TERM GOAL #3   Title  He will report able to wake with no pain    Baseline  AM pain worst time of day    Time  6    Period  Weeks    Status  New      PT LONG TERM GOAL #4   Title  He will report able to walk for basic community activity with 1-2 max pain    Baseline  he report pain moderate to sever walking out of home    Time  6    Period  Weeks    Status  New            Plan - 11/01/19 1212    Clinical Impression  Statement  He has made some pain improvements and his general movements are now smooth and unrestricted with walking and bed mobility. he should make further gains with skilled PT but due to chronic nature of his pain may not  get fully resolved.    Examination-Activity Limitations  Bend;Reach Overhead;Locomotion Level;Sleep;Squat;Stand;Lift    Examination-Participation Restrictions  Community Activity;Driving;Cleaning;Yard Work;Shop    PT Frequency  2x / week    PT Duration  4 weeks    PT Treatment/Interventions  Electrical Stimulation;Moist Heat;Therapeutic exercise;Therapeutic activities;Patient/family education;Manual techniques;Passive range of motion;Dry needling;Taping    PT Next Visit Plan  Manual and modalities , review HEP and  add as needed    PT Home Exercise Plan  PPT LTR, diapragmanmatic breathing.   car/camel,    childs pose, knee to chest, bridge , clam    Consulted and Agree with Plan of Care  Patient       Patient will benefit from skilled therapeutic intervention in order to improve the following deficits and impairments:  Decreased range of motion, Difficulty walking, Pain, Increased muscle spasms, Decreased activity tolerance, Decreased strength  Visit Diagnosis: Chronic bilateral low back pain, unspecified whether sciatica present  Muscle spasm of back  Joint stiffness of spine     Problem List Patient Active Problem List   Diagnosis Date Noted  . Migraine without aura and without status migrainosus, not intractable 08/03/2019  . Current severe episode of major depressive disorder with psychotic features ([place]) 08/03/2019  . Lumbar radiculopathy 06/29/2019  . Right shoulder pain 01/29/2018  . Hyperlipidemia 08/05/2017  . Anxiety and depression 08/05/2017  . Hypertension 08/05/2017  . Diabetes mellitus screening 04/01/2017  . S/P craniotomy 01/27/2016  . Benign meningioma ([place]) 01/27/2016  . CKD (chronic kidney disease) stage 2, GFR 60-89 ml/min 01/18/2016  . Tobacco abuse 01/18/2016  . Seizures ([place])   . GERD (gastroesophageal reflux disease) 04/13/2013    Darrel Hoover  PT 11/01/2019, 12:53 PM  [facility] [address] [place], [place], [address] Phone: [phone]   Fax:  [phone]  Name: Jeremy Soto MRN: [id number] Date of Birth: [birth year]

## 2019-11-06 ENCOUNTER — Encounter: Payer: Self-pay | Admitting: Internal Medicine

## 2019-11-12 ENCOUNTER — Other Ambulatory Visit: Payer: Self-pay

## 2019-11-12 ENCOUNTER — Ambulatory Visit: Payer: Medicaid Other | Attending: Physical Medicine and Rehabilitation

## 2019-11-12 DIAGNOSIS — M545 Low back pain, unspecified: Secondary | ICD-10-CM

## 2019-11-12 DIAGNOSIS — M2560 Stiffness of unspecified joint, not elsewhere classified: Secondary | ICD-10-CM | POA: Diagnosis present

## 2019-11-12 DIAGNOSIS — M62830 Muscle spasm of back: Secondary | ICD-10-CM

## 2019-11-12 DIAGNOSIS — G8929 Other chronic pain: Secondary | ICD-10-CM | POA: Insufficient documentation

## 2019-11-12 NOTE — Therapy (Signed)
[place], [place], [address] Phone: [phone]   Fax:  [phone]  Physical Therapy Treatment  Patient Details  Name: Jeremy Soto MRN: [id number] Date of Birth: [birth year] Referring Provider (PT): Almyra Free, MD   Encounter Date: 11/12/2019  PT End of Session - 11/12/19 1353    Visit Number  6    Number of Visits  13    Date for PT Re-Evaluation  11/30/19    Authorization Type  MCD    Authorization Time Period  11/12/19 to 11/25/19    Authorization - Visit Number  1    Authorization - Number of Visits  3    PT Start Time  0245    PT Stop Time  0345    PT Time Calculation (min)  60 min    Activity Tolerance  Patient tolerated treatment well;Patient limited by pain    Behavior During Therapy  [facility] for tasks assessed/performed       Past Medical History:  Diagnosis Date  . Anxiety   . Arthritis   . Asthma    as a child  . Brain tumor ([place])   . Cancer [facility])    brain tumor  . Chronic kidney disease   . Constipation   . Depression   . GERD (gastroesophageal reflux disease)   . Head injury due to trauma    dropped chain saw on head- had staples  . Headache   . Hyperlipidemia   . Peptic ulcer   . Seizure ([place])   . Shortness of breath dyspnea    with exertion   . Sickle cell trait ([place])   . Sleep apnea   . Stroke ([place])   . Substance abuse [facility])     Past Surgical History:  Procedure Laterality Date  . CLOSED REDUCTION NASAL FRACTURE N/A 06/04/2014   Procedure: CLOSED REDUCTION NASAL FRACTURE;  Surgeon: Ascencion Dike, MD;  Location: [place];  Service: ENT;  Laterality: N/A;  . CRANIOTOMY Right 01/27/2016   Procedure: RIGHT FRONTAL CRANIOTOMY FOR MENINGIOMA;  Surgeon: Kristeen Miss, MD;  Location: [place] NEUR[facility];  Service: Neurosurgery;  Laterality: Right;  RIGHT FRONTAL CRANIOTOMY FOR MENINGIOMA  . DIAGNOSTIC LAPAROSCOPY     - stomach  . ESOPHAGOGASTRODUODENOSCOPY  ENDOSCOPY      There were no vitals filed for this visit.  Subjective Assessment - 11/12/19 1452    Subjective  Don't want to exercise today. Doing about the same.   Contiued back pan.    Pain Score  5     Pain Location  Back    Pain Orientation  Right;Left;Lower    Pain Descriptors / Indicators  Pressure    Pain Type  Chronic pain    Pain Onset  More than a month ago    Pain Frequency  Constant    Aggravating Factors   bending    Pain Relieving Factors  rest , meds                       [facility] Adult PT Treatment/Exercise - 11/12/19 0001      Therapeutic Activites    Therapeutic Activities  Lifting    Lifting  10# kettle bell x 10 from 8 inch platform      Lumbar Exercises: Stretches   Lower Trunk Rotation  3 reps;20 seconds    Pelvic Tilt  15 reps;5 seconds      Lumbar Exercises: Aerobic  Nustep  L5 UE/LE 6  min      Lumbar Exercises: Supine   Bent Knee Raise  15 reps   green band   Bent Knee Raise Limitations  RT/LT    Bridge  15 reps      Lumbar Exercises: Sidelying   Clam  Right;Left;15 reps    Clam Limitations  green band    Other Sidelying Lumbar Exercises  --      Moist Heat Therapy   Number Minutes Moist Heat  15 Minutes      Electrical Stimulation   Electrical Stimulation Location  lumbar    Electrical Stimulation Action  IFC    Electrical Stimulation Parameters  to tolersnce    Electrical Stimulation Goals  Pain      Manual Therapy   Joint Mobilization  Gr 2-3-4 thoracic and lumbar.  and bilateral hip IR and knee flexion stretching    Soft tissue mobilization  paraspinals and QL               PT Short Term Goals - 11/01/19 1212      PT SHORT TERM GOAL #1   Title  He will be indpendent with initial hEP.    Status  Achieved      PT SHORT TERM GOAL #2   Title  He will report pain eased 10-20% generally.    Baseline  overall pain 20% better or more Still takes meds    Status  Achieved        PT Long Term Goals -  11/12/19 1538      PT LONG TERM GOAL #1   Title  He will be indpendent with all issued HEP.    Status  On-going      PT LONG TERM GOAL #2   Title  He will report pain as intermittant and decreased 50% or more    Baseline  pain constant but variable but really more in RT hip than back      PT LONG TERM GOAL #3   Title  He will report able to wake with no pain    Baseline  Still problematic    Status  On-going      PT LONG TERM GOAL #4   Title  He will report able to walk for basic community activity with 1-2 max pain    Baseline  Able  but pain varies and can be severe at times    Status  On-going            Plan - 11/12/19 1354    Clinical Impression Statement  No changes since last session .  Contnues to walk smoothly and move without indication of pain.  Felt looser in spine after mobilization.  less soft tissue tension in paraspinals    PT Treatment/Interventions  Electrical Stimulation;Moist Heat;Therapeutic exercise;Therapeutic activities;Patient/family education;Manual techniques;Passive range of motion;Dry needling;Taping    PT Next Visit Plan  Manual and modalities , review HEP and add as needed    PT Home Exercise Plan  PPT LTR, diapragmanmatic breathing.   car/camel,    childs pose, knee to chest, bridge , clam    Consulted and Agree with Plan of Care  Patient       Patient will benefit from skilled therapeutic intervention in order to improve the following deficits and impairments:  Decreased range of motion, Difficulty walking, Pain, Increased muscle spasms, Decreased activity tolerance, Decreased strength  Visit Diagnosis: Chronic bilateral low back pain, unspecified whether sciatica  present  Muscle spasm of back  Joint stiffness of spine     Problem List Patient Active Problem List   Diagnosis Date Noted  . Migraine without aura and without status migrainosus, not intractable 08/03/2019  . Current severe episode of major depressive disorder with  psychotic features ([place]) 08/03/2019  . Lumbar radiculopathy 06/29/2019  . Right shoulder pain 01/29/2018  . Hyperlipidemia 08/05/2017  . Anxiety and depression 08/05/2017  . Hypertension 08/05/2017  . Diabetes mellitus screening 04/01/2017  . S/P craniotomy 01/27/2016  . Benign meningioma ([place]) 01/27/2016  . CKD (chronic kidney disease) stage 2, GFR 60-89 ml/min 01/18/2016  . Tobacco abuse 01/18/2016  . Seizures ([place])   . GERD (gastroesophageal reflux disease) 04/13/2013    Jeremy Soto  PT 11/12/2019, 4:18 PM  [facility] [address] [place], [place], [address] Phone: [phone]   Fax:  [phone]  Name: Jeremy Soto MRN: [id number] Date of Birth: 16-Apr-1966

## 2019-11-15 ENCOUNTER — Ambulatory Visit: Payer: Medicaid Other

## 2019-11-19 ENCOUNTER — Other Ambulatory Visit: Payer: Self-pay

## 2019-11-19 ENCOUNTER — Ambulatory Visit: Payer: Medicaid Other

## 2019-11-19 DIAGNOSIS — M2560 Stiffness of unspecified joint, not elsewhere classified: Secondary | ICD-10-CM

## 2019-11-19 DIAGNOSIS — M545 Low back pain, unspecified: Secondary | ICD-10-CM

## 2019-11-19 DIAGNOSIS — M62830 Muscle spasm of back: Secondary | ICD-10-CM

## 2019-11-19 DIAGNOSIS — G8929 Other chronic pain: Secondary | ICD-10-CM

## 2019-11-19 NOTE — Patient Instructions (Signed)
Green band row/ shoulder ext/ rotation x 15 each  daily

## 2019-11-19 NOTE — Therapy (Signed)
[place], [place], [address] Phone: [phone]   Fax:  [phone]  Physical Therapy Treatment  Patient Details  Name: Jeremy Soto MRN: [id number] Date of Birth: 11-16-1965 Referring Provider (PT): Almyra Free, MD   Encounter Date: 11/19/2019  PT End of Session - 11/19/19 1342    Visit Number  7    Number of Visits  13    Date for PT Re-Evaluation  11/30/19    Authorization Type  MCD    Authorization Time Period  11/12/19 to 11/25/19    Authorization - Visit Number  2    Authorization - Number of Visits  3    PT Start Time  0130    PT Stop Time  0230   paiuse treatment as PT ahd tpo see another pt   PT Time Calculation (min)  60 min    Activity Tolerance  Patient tolerated treatment well;Patient limited by pain    Behavior During Therapy  [facility] for tasks assessed/performed       Past Medical History:  Diagnosis Date  . Anxiety   . Arthritis   . Asthma    as a child  . Brain tumor ([place])   . Cancer [facility])    brain tumor  . Chronic kidney disease   . Constipation   . Depression   . GERD (gastroesophageal reflux disease)   . Head injury due to trauma    dropped chain saw on head- had staples  . Headache   . Hyperlipidemia   . Peptic ulcer   . Seizure ([place])   . Shortness of breath dyspnea    with exertion   . Sickle cell trait ([place])   . Sleep apnea   . Stroke ([place])   . Substance abuse [facility])     Past Surgical History:  Procedure Laterality Date  . CLOSED REDUCTION NASAL FRACTURE N/A 06/04/2014   Procedure: CLOSED REDUCTION NASAL FRACTURE;  Surgeon: Ascencion Dike, MD;  Location: [place];  Service: ENT;  Laterality: N/A;  . CRANIOTOMY Right 01/27/2016   Procedure: RIGHT FRONTAL CRANIOTOMY FOR MENINGIOMA;  Surgeon: Kristeen Miss, MD;  Location: [place] NEUR[facility];  Service: Neurosurgery;  Laterality: Right;  RIGHT FRONTAL CRANIOTOMY FOR MENINGIOMA  . DIAGNOSTIC LAPAROSCOPY     -  stomach  . ESOPHAGOGASTRODUODENOSCOPY ENDOSCOPY      There were no vitals filed for this visit.  Subjective Assessment - 11/19/19 1340    Subjective  Continued back pain. &/10 today. Missed last apppointemnt so its worse    Pain Score  7     Pain Location  Back    Pain Orientation  Right;Left;Lower    Pain Descriptors / Indicators  Pressure    Pain Type  Chronic pain    Pain Onset  More than a month ago    Pain Frequency  Constant    Aggravating Factors   bending    Pain Relieving Factors  rest , meds                       [facility] Adult PT Treatment/Exercise - 11/19/19 0001      Lumbar Exercises: Stretches   Single Knee to Chest Stretch  Right;Left;2 reps;30 seconds    Lower Trunk Rotation Limitations  15 reps RT/LT      Pelvic Tilt  15 reps;5 seconds      Lumbar Exercises: Aerobic   Nustep  L7 UE/LE 6  min      Lumbar Exercises: Standing   Row  Both;15 reps    Theraband Level (Row)  Level 3 (Green)    Shoulder Extension  Both;15 reps    Theraband Level (Shoulder Extension)  Level 3 (Green)    Other Standing Lumbar Exercises  rotation with green band x 15 RT and Lt       Lumbar Exercises: Supine   Bent Knee Raise  15 reps    Bent Knee Raise Limitations  RT/LT    Bridge  20 reps      Lumbar Exercises: Sidelying   Clam  Right;Left;20 reps    Clam Limitations  green band      Moist Heat Therapy   Number Minutes Moist Heat  15 Minutes    Moist Heat Location  Lumbar Spine      Electrical Stimulation   Electrical Stimulation Location  lumbar    Electrical Stimulation Action  IFC    Electrical Stimulation Parameters   to tolerance    Electrical Stimulation Goals  Pain             PT Education - 11/19/19 1347    Education Details  HEP green band    Person(s) Educated  Patient    Methods  Explanation;Demonstration;Verbal cues;Handout    Comprehension  Returned demonstration;Verbalized understanding       PT Short Term Goals - 11/01/19 1212       PT SHORT TERM GOAL #1   Title  He will be indpendent with initial hEP.    Status  Achieved      PT SHORT TERM GOAL #2   Title  He will report pain eased 10-20% generally.    Baseline  overall pain 20% better or more Still takes meds    Status  Achieved        PT Long Term Goals - 11/12/19 1538      PT LONG TERM GOAL #1   Title  He will be indpendent with all issued HEP.    Status  On-going      PT LONG TERM GOAL #2   Title  He will report pain as intermittant and decreased 50% or more    Baseline  pain constant but variable but really more in RT hip than back      PT LONG TERM GOAL #3   Title  He will report able to wake with no pain    Baseline  Still problematic    Status  On-going      PT LONG TERM GOAL #4   Title  He will report able to walk for basic community activity with 1-2 max pain    Baseline  Able  but pain varies and can be severe at times    Status  On-going            Plan - 11/19/19 1343    Clinical Impression Statement  Pt reports pain is worse but he  is still moving well and he push much effort into exercises without reporting increased pain. He tolerated progression of HEP with band.   Would like to see if he can make any gains over 4-5 more session  If not will discharge    PT Treatment/Interventions  Electrical Stimulation;Moist Heat;Therapeutic exercise;Therapeutic activities;Patient/family education;Manual techniques;Passive range of motion;Dry needling;Taping    PT Next Visit Plan  Manual and modalities , review HEP and add as needed    PT Home Exercise Plan  PPT LTR,  diapragmanmatic breathing.   car/camel,    childs pose, knee to chest, bridge , clam,m band green row/rotation trunk and  shoulder extension.    Consulted and Agree with Plan of Care  Patient       Patient will benefit from skilled therapeutic intervention in order to improve the following deficits and impairments:  Decreased range of motion, Difficulty walking, Pain,  Increased muscle spasms, Decreased activity tolerance, Decreased strength  Visit Diagnosis: Chronic bilateral low back pain, unspecified whether sciatica present  Muscle spasm of back  Joint stiffness of spine     Problem List Patient Active Problem List   Diagnosis Date Noted  . Migraine without aura and without status migrainosus, not intractable 08/03/2019  . Current severe episode of major depressive disorder with psychotic features ([place]) 08/03/2019  . Lumbar radiculopathy 06/29/2019  . Right shoulder pain 01/29/2018  . Hyperlipidemia 08/05/2017  . Anxiety and depression 08/05/2017  . Hypertension 08/05/2017  . Diabetes mellitus screening 04/01/2017  . S/P craniotomy 01/27/2016  . Benign meningioma ([place]) 01/27/2016  . CKD (chronic kidney disease) stage 2, GFR 60-89 ml/min 01/18/2016  . Tobacco abuse 01/18/2016  . Seizures ([place])   . GERD (gastroesophageal reflux disease) 04/13/2013    Darrel Hoover PT  11/19/2019, 3:33 PM  [facility] [address] [place], [place], [address] Phone: [phone]   Fax:  [phone]  Name: Jeremy Soto MRN: [id number] Date of Birth: [birth year]

## 2019-11-22 ENCOUNTER — Telehealth: Payer: Self-pay | Admitting: Physical Therapy

## 2019-11-22 ENCOUNTER — Ambulatory Visit: Payer: Medicaid Other

## 2019-11-22 NOTE — Telephone Encounter (Signed)
Spoke to pt about missed appointment today and next appointment next week. He said he would come to that appointment.

## 2019-11-27 ENCOUNTER — Ambulatory Visit: Payer: Medicaid Other

## 2019-11-29 ENCOUNTER — Ambulatory Visit: Payer: Medicaid Other

## 2019-11-29 ENCOUNTER — Other Ambulatory Visit: Payer: Self-pay

## 2019-11-29 DIAGNOSIS — G8929 Other chronic pain: Secondary | ICD-10-CM

## 2019-11-29 DIAGNOSIS — M545 Low back pain: Secondary | ICD-10-CM | POA: Diagnosis not present

## 2019-11-29 DIAGNOSIS — M62830 Muscle spasm of back: Secondary | ICD-10-CM

## 2019-11-29 DIAGNOSIS — M2560 Stiffness of unspecified joint, not elsewhere classified: Secondary | ICD-10-CM

## 2019-11-29 NOTE — Therapy (Signed)
[place], [place], [address] Phone: [phone]   Fax:  [phone]  Physical Therapy Treatment  Patient Details  Name: Jeremy Soto MRN: [id number] Date of Birth: Apr 11, 1966 Referring Provider (PT): Almyra Free, MD   Encounter Date: 11/29/2019  PT End of Session - 11/29/19 1327    Visit Number  8    Number of Visits  16    Date for PT Re-Evaluation  12/28/19    Authorization Type  MCD    Authorization Time Period  11/12/19 to 11/25/19    Authorization - Visit Number  3    Authorization - Number of Visits  3    PT Start Time  0122    PT Stop Time  0220    PT Time Calculation (min)  58 min    Activity Tolerance  Patient tolerated treatment well    Behavior During Therapy  [facility] for tasks assessed/performed       Past Medical History:  Diagnosis Date  . Anxiety   . Arthritis   . Asthma    as a child  . Brain tumor ([name])   . Cancer [facility])    brain tumor  . Chronic kidney disease   . Constipation   . Depression   . GERD (gastroesophageal reflux disease)   . Head injury due to trauma    dropped chain saw on head- had staples  . Headache   . Hyperlipidemia   . Peptic ulcer   . Seizure ([place])   . Shortness of breath dyspnea    with exertion   . Sickle cell trait ([place])   . Sleep apnea   . Stroke ([place])   . Substance abuse [facility])     Past Surgical History:  Procedure Laterality Date  . CLOSED REDUCTION NASAL FRACTURE N/A 06/04/2014   Procedure: CLOSED REDUCTION NASAL FRACTURE;  Surgeon: Ascencion Dike, MD;  Location: [place];  Service: ENT;  Laterality: N/A;  . CRANIOTOMY Right 01/27/2016   Procedure: RIGHT FRONTAL CRANIOTOMY FOR MENINGIOMA;  Surgeon: Kristeen Miss, MD;  Location: [place] NEUR[facility]S;  Service: Neurosurgery;  Laterality: Right;  RIGHT FRONTAL CRANIOTOMY FOR MENINGIOMA  . DIAGNOSTIC LAPAROSCOPY     - stomach  . ESOPHAGOGASTRODUODENOSCOPY ENDOSCOPY      There were no  vitals filed for this visit.  Subjective Assessment - 11/29/19 1330    Subjective  7/10 LBP today.    Pain Score  7     Pain Location  Back    Pain Orientation  Right;Left;Lower    Pain Descriptors / Indicators  Pressure    Pain Type  Chronic pain    Pain Onset  More than a month ago    Pain Frequency  Constant    Aggravating Factors   bending    Pain Relieving Factors  ret meds                       [facility] Adult PT Treatment/Exercise - 11/29/19 0001      Lumbar Exercises: Stretches   Single Knee to Chest Stretch  Right;Left;2 reps;30 seconds    Lower Trunk Rotation Limitations  15 reps RT/LT      Pelvic Tilt  15 reps;5 seconds      Lumbar Exercises: Aerobic   Nustep  L7 UE/LE 6  min      Lumbar Exercises: Standing   Row  Both;15 reps    Theraband Level (Row)  Level 4 (Blue)    Shoulder Extension  Both;15 reps    Theraband Level (Shoulder Extension)  Level 4 (Blue)    Other Standing Lumbar Exercises  rotation with blue band x 15 RT and Lt       Lumbar Exercises: Supine   Bent Knee Raise  15 reps    Bent Knee Raise Limitations  RT/LT blue band around knees    Bridge  20 reps      Lumbar Exercises: Sidelying   Clam  Right;Left;20 reps    Clam Limitations  blue band    Hip Abduction  Right;Left;10 reps    Hip Abduction Limitations  blue band      Lumbar Exercises: Quadruped   Opposite Arm/Leg Raise  Right arm/Left leg;Left arm/Right leg;10 reps      Moist Heat Therapy   Number Minutes Moist Heat  15 Minutes    Moist Heat Location  Lumbar Spine      Electrical Stimulation   Electrical Stimulation Location  lumbar    Electrical Stimulation Action  IFC    Electrical Stimulation Parameters  to tolerance    Electrical Stimulation Goals  Pain               PT Short Term Goals - 11/01/19 1212      PT SHORT TERM GOAL #1   Title  He will be indpendent with initial hEP.    Status  Achieved      PT SHORT TERM GOAL #2   Title  He will report  pain eased 10-20% generally.    Baseline  overall pain 20% better or more Still takes meds    Status  Achieved        PT Long Term Goals - 11/29/19 1357      PT LONG TERM GOAL #1   Title  He will be indpendent with all issued HEP.    Baseline  indpendent with initial HEP    Status  On-going      PT LONG TERM GOAL #2   Title  He will report pain as intermittant and decreased 50% or more    Baseline  Last time without back pain was last week part of one day.    Status  On-going      PT LONG TERM GOAL #3   Title  He will report able to wake with no pain    Baseline  Still problematic    Status  On-going      PT LONG TERM GOAL #4   Title  He will report able to walk for basic community activity with 1-2 max pain    Baseline  Able  but pain varies and can be severe at times 5-9/10 after walk of  a block    Status  On-going            Plan - 11/29/19 1327    Clinical Impression Statement  Continues to do well with all exercises though reported pain levels remain high.   would like to continnue for 4-6 more sesssions to see if pain will decrease and can progress hone exer program.    PT Frequency  2x / week    PT Duration  4 weeks    PT Treatment/Interventions  Electrical Stimulation;Moist Heat;Therapeutic exercise;Therapeutic activities;Patient/family education;Manual techniques;Passive range of motion;Dry needling;Taping    PT Next Visit Plan  Manual and modalities , review HEP and add as needed    PT Home Exercise Plan  PPT  LTR, diapragmanmatic breathing.   car/camel,    childs pose, knee to chest, bridge , clam,m band green row/rotation trunk and  shoulder extension.       Patient will benefit from skilled therapeutic intervention in order to improve the following deficits and impairments:  Decreased range of motion, Difficulty walking, Pain, Increased muscle spasms, Decreased activity tolerance, Decreased strength  Visit Diagnosis: Chronic bilateral low back pain,  unspecified whether sciatica present  Muscle spasm of back  Joint stiffness of spine     Problem List Patient Active Problem List   Diagnosis Date Noted  . Migraine without aura and without status migrainosus, not intractable 08/03/2019  . Current severe episode of major depressive disorder with psychotic features ([place]) 08/03/2019  . Lumbar radiculopathy 06/29/2019  . Right shoulder pain 01/29/2018  . Hyperlipidemia 08/05/2017  . Anxiety and depression 08/05/2017  . Hypertension 08/05/2017  . Diabetes mellitus screening 04/01/2017  . S/P craniotomy 01/27/2016  . Benign meningioma ([place]) 01/27/2016  . CKD (chronic kidney disease) stage 2, GFR 60-89 ml/min 01/18/2016  . Tobacco abuse 01/18/2016  . Seizures ([place])   . GERD (gastroesophageal reflux disease) 04/13/2013    Darrel Hoover   PT 11/29/2019, 2:12 PM  [facility] [address] [place], [place], [address] Phone: [phone]   Fax:  [phone]  Name: Jeremy Soto MRN: [id number] Date of Birth: 10-09-1966

## 2019-12-03 ENCOUNTER — Ambulatory Visit: Payer: Medicaid Other

## 2019-12-06 ENCOUNTER — Ambulatory Visit: Payer: Medicaid Other | Attending: Physical Medicine and Rehabilitation

## 2019-12-06 ENCOUNTER — Other Ambulatory Visit: Payer: Self-pay

## 2019-12-06 DIAGNOSIS — M62830 Muscle spasm of back: Secondary | ICD-10-CM

## 2019-12-06 DIAGNOSIS — G8929 Other chronic pain: Secondary | ICD-10-CM | POA: Insufficient documentation

## 2019-12-06 DIAGNOSIS — M545 Low back pain: Secondary | ICD-10-CM | POA: Diagnosis present

## 2019-12-06 DIAGNOSIS — M2560 Stiffness of unspecified joint, not elsewhere classified: Secondary | ICD-10-CM | POA: Diagnosis present

## 2019-12-06 NOTE — Therapy (Signed)
[place], [place], [address] Phone: [phone]   Fax:  [phone]  Physical Therapy Treatment  Patient Details  Name: Jeremy Soto MRN: [id number] Date of Birth: December 25, 1965 Referring Provider (PT): Almyra Free, MD   Encounter Date: 12/06/2019  PT End of Session - 12/06/19 1336    Visit Number  9    Number of Visits  16    Date for PT Re-Evaluation  12/28/19    Authorization Type  MCD    Authorization Time Period  11/12/19 to 11/25/19    Authorization - Visit Number  1    Authorization - Number of Visits  8    PT Start Time  0125    PT Stop Time  0220    PT Time Calculation (min)  55 min    Activity Tolerance  Patient tolerated treatment well    Behavior During Therapy  [facility] for tasks assessed/performed       Past Medical History:  Diagnosis Date  . Anxiety   . Arthritis   . Asthma    as a child  . Brain tumor ([place])   . Cancer [facility])    brain tumor  . Chronic kidney disease   . Constipation   . Depression   . GERD (gastroesophageal reflux disease)   . Head injury due to trauma    dropped chain saw on head- had staples  . Headache   . Hyperlipidemia   . Peptic ulcer   . Seizure ([place])   . Shortness of breath dyspnea    with exertion   . Sickle cell trait ([place])   . Sleep apnea   . Stroke ([place])   . Substance abuse [facility])     Past Surgical History:  Procedure Laterality Date  . CLOSED REDUCTION NASAL FRACTURE N/A 06/04/2014   Procedure: CLOSED REDUCTION NASAL FRACTURE;  Surgeon: Ascencion Dike, MD;  Location: [place];  Service: ENT;  Laterality: N/A;  . CRANIOTOMY Right 01/27/2016   Procedure: RIGHT FRONTAL CRANIOTOMY FOR MENINGIOMA;  Surgeon: Kristeen Miss, MD;  Location: [place] [facility];  Service: Neurosurgery;  Laterality: Right;  RIGHT FRONTAL CRANIOTOMY FOR MENINGIOMA  . DIAGNOSTIC LAPAROSCOPY     - stomach  . ESOPHAGOGASTRODUODENOSCOPY ENDOSCOPY      There were no  vitals filed for this visit.  Subjective Assessment - 12/06/19 1351    Subjective  6/10 today . Not so bad. Constant    Pain Score  6     Pain Location  Back    Pain Orientation  Right;Left;Lower    Pain Descriptors / Indicators  Pressure    Pain Type  Chronic pain    Pain Onset  More than a month ago    Pain Frequency  Constant    Aggravating Factors   bending    Pain Relieving Factors  rest, meds                       [facility] Adult PT Treatment/Exercise - 12/06/19 0001      Lumbar Exercises: Aerobic   Nustep  L7 UE/LE 6  min      Lumbar Exercises: Standing   Row  Both;20 reps    Row Limitations  black band    Shoulder Extension  Both;20 reps    Shoulder Extension Limitations  black band    Other Standing Lumbar Exercises  rotation with black band x 15 RT and Lt  Lumbar Exercises: Supine   Bent Knee Raise  15 reps    Bent Knee Raise Limitations  RT/LT blackband around knees    Bridge  15 reps      Lumbar Exercises: Sidelying   Clam  Right;Left;20 reps    Clam Limitations  black band    Hip Abduction  Right;Left;10 reps    Hip Abduction Limitations  black band      Lumbar Exercises: Quadruped   Opposite Arm/Leg Raise  Right arm/Left leg;Left arm/Right leg;10 reps      Moist Heat Therapy   Number Minutes Moist Heat  15 Minutes    Moist Heat Location  Lumbar Spine      Electrical Stimulation   Electrical Stimulation Location  lumbar    Electrical Stimulation Action  IFC    Electrical Stimulation Parameters  to tolerance    Electrical Stimulation Goals  Pain               PT Short Term Goals - 11/01/19 1212      PT SHORT TERM GOAL #1   Title  He will be indpendent with initial hEP.    Status  Achieved      PT SHORT TERM GOAL #2   Title  He will report pain eased 10-20% generally.    Baseline  overall pain 20% better or more Still takes meds    Status  Achieved        PT Long Term Goals - 12/06/19 1353      PT LONG TERM GOAL  #1   Title  He will be indpendent with all issued HEP.    Status  On-going      PT LONG TERM GOAL #2   Title  He will report pain as intermittant and decreased 50% or more    Status  On-going      PT LONG TERM GOAL #3   Title  He will report able to wake with no pain    Baseline  Still problematic    Status  On-going      PT LONG TERM GOAL #4   Title  He will report able to walk for basic community activity with 1-2 max pain    Baseline  Able  but pain varies and can be severe at times 5-9/10 after walk of  a block    Status  On-going            Plan - 12/06/19 1336    Clinical Impression Statement  Reports high pain levels but no limits with exercise. Will progress with HEP and modalities and discharge in 4-6 visits    PT Treatment/Interventions  Electrical Stimulation;Moist Heat;Therapeutic exercise;Therapeutic activities;Patient/family education;Manual techniques;Passive range of motion;Dry needling;Taping    PT Next Visit Plan  Manual and modalities , review HEP and add as needed    PT Home Exercise Plan  PPT LTR, diapragmanmatic breathing.   car/camel,    childs pose, knee to chest, bridge , clam,m band green row/rotation trunk and  shoulder extension.    Consulted and Agree with Plan of Care  Patient       Patient will benefit from skilled therapeutic intervention in order to improve the following deficits and impairments:  Decreased range of motion, Difficulty walking, Pain, Increased muscle spasms, Decreased activity tolerance, Decreased strength  Visit Diagnosis: Muscle spasm of back  Chronic bilateral low back pain, unspecified whether sciatica present  Joint stiffness of spine     Problem List Patient  Active Problem List   Diagnosis Date Noted  . Migraine without aura and without status migrainosus, not intractable 08/03/2019  . Current severe episode of major depressive disorder with psychotic features ([place]) 08/03/2019  . Lumbar radiculopathy 06/29/2019   . Right shoulder pain 01/29/2018  . Hyperlipidemia 08/05/2017  . Anxiety and depression 08/05/2017  . Hypertension 08/05/2017  . Diabetes mellitus screening 04/01/2017  . S/P craniotomy 01/27/2016  . Benign meningioma ([place]) 01/27/2016  . CKD (chronic kidney disease) stage 2, GFR 60-89 ml/min 01/18/2016  . Tobacco abuse 01/18/2016  . Seizures ([place])   . GERD (gastroesophageal reflux disease) 04/13/2013    Darrel Hoover PT 12/06/2019, 2:11 PM  [facility] [address] [place], [place], [address] Phone: [phone]   Fax:  [phone]  Name: Jeremy Soto MRN: [id number] Date of Birth: 07/28/1966

## 2019-12-11 ENCOUNTER — Ambulatory Visit: Payer: Medicaid Other

## 2019-12-13 ENCOUNTER — Telehealth: Payer: Self-pay | Admitting: Physical Therapy

## 2019-12-13 ENCOUNTER — Ambulatory Visit: Payer: Medicaid Other

## 2019-12-13 NOTE — Telephone Encounter (Signed)
Message left about missed appointment today and next appointment 12/18/19 at [facility].

## 2019-12-18 ENCOUNTER — Other Ambulatory Visit: Payer: Self-pay

## 2019-12-18 ENCOUNTER — Ambulatory Visit: Payer: Medicaid Other

## 2019-12-18 DIAGNOSIS — M2560 Stiffness of unspecified joint, not elsewhere classified: Secondary | ICD-10-CM

## 2019-12-18 DIAGNOSIS — M62830 Muscle spasm of back: Secondary | ICD-10-CM

## 2019-12-18 DIAGNOSIS — G8929 Other chronic pain: Secondary | ICD-10-CM

## 2019-12-18 NOTE — Therapy (Signed)
[place], [place], [address] Phone: [phone]   Fax:  [phone]  Physical Therapy Treatment  Patient Details  Name: Jeremy Soto MRN: [id number] Date of Birth: [birth year] Referring Provider (PT): Almyra Free, MD   Encounter Date: 12/18/2019  PT End of Session - 12/18/19 1333    Visit Number  10    Number of Visits  16    Date for PT Re-Evaluation  12/28/19    Authorization Type  MCD    Authorization Time Period  2/4 to 01/02/20    Authorization - Visit Number  2    Authorization - Number of Visits  8    PT Start Time  0130    PT Stop Time  0220    PT Time Calculation (min)  50 min    Activity Tolerance  Patient tolerated treatment well    Behavior During Therapy  [facility] for tasks assessed/performed       Past Medical History:  Diagnosis Date  . Anxiety   . Arthritis   . Asthma    as a child  . Brain tumor ([place])   . Cancer [facility])    brain tumor  . Chronic kidney disease   . Constipation   . Depression   . GERD (gastroesophageal reflux disease)   . Head injury due to trauma    dropped chain saw on head- had staples  . Headache   . Hyperlipidemia   . Peptic ulcer   . Seizure ([place])   . Shortness of breath dyspnea    with exertion   . Sickle cell trait ([place])   . Sleep apnea   . Stroke ([place])   . Substance abuse [facility])     Past Surgical History:  Procedure Laterality Date  . CLOSED REDUCTION NASAL FRACTURE N/A 06/04/2014   Procedure: CLOSED REDUCTION NASAL FRACTURE;  Surgeon: Ascencion Dike, MD;  Location: [place];  Service: ENT;  Laterality: N/A;  . CRANIOTOMY Right 01/27/2016   Procedure: RIGHT FRONTAL CRANIOTOMY FOR MENINGIOMA;  Surgeon: Kristeen Miss, MD;  Location: [place] NEURO ORS;  Service: Neurosurgery;  Laterality: Right;  RIGHT FRONTAL CRANIOTOMY FOR MENINGIOMA  . DIAGNOSTIC LAPAROSCOPY     - stomach  . ESOPHAGOGASTRODUODENOSCOPY ENDOSCOPY      There were no  vitals filed for this visit.  Subjective Assessment - 12/18/19 1339    Subjective  6/10 today . Not so bad. Constant    Pain Score  6     Pain Location  Back    Pain Descriptors / Indicators  Pressure    Pain Type  Chronic pain    Pain Radiating Towards  lateral thighs    Pain Onset  More than a month ago    Pain Frequency  Constant    Aggravating Factors   bending    Pain Relieving Factors  rest , meds                       [facility] Adult PT Treatment/Exercise - 12/18/19 0001      Lumbar Exercises: Aerobic   Nustep  L7 UE/LE 6  min      Lumbar Exercises: Standing   Row  Both;20 reps    Row Limitations  black band    Shoulder Extension  Both;20 reps    Shoulder Extension Limitations  black band    Other Standing Lumbar Exercises  rotation with black band x 15 RT  and Lt       Lumbar Exercises: Supine   Bent Knee Raise  15 reps    Bent Knee Raise Limitations  RT/LT blackband around knees    Bridge  15 reps      Lumbar Exercises: Sidelying   Clam  Right;Left;20 reps    Clam Limitations  black band    Hip Abduction  Right;Left;10 reps    Hip Abduction Limitations  black band      Lumbar Exercises: Quadruped   Opposite Arm/Leg Raise  Right arm/Left leg;Left arm/Right leg;10 reps      Modalities   Modalities  Traction      Moist Heat Therapy   Number Minutes Moist Heat  15 Minutes    Moist Heat Location  Lumbar Spine      Traction   Type of Traction  Lumbar    Min (lbs)  25    Max (lbs)  80    Hold Time  90    Rest Time  20    Time  15               PT Short Term Goals - 11/01/19 1212      PT SHORT TERM GOAL #1   Title  He will be indpendent with initial hEP.    Status  Achieved      PT SHORT TERM GOAL #2   Title  He will report pain eased 10-20% generally.    Baseline  overall pain 20% better or more Still takes meds    Status  Achieved        PT Long Term Goals - 12/06/19 1353      PT LONG TERM GOAL #1   Title  He will be  indpendent with all issued HEP.    Status  On-going      PT LONG TERM GOAL #2   Title  He will report pain as intermittant and decreased 50% or more    Status  On-going      PT LONG TERM GOAL #3   Title  He will report able to wake with no pain    Baseline  Still problematic    Status  On-going      PT LONG TERM GOAL #4   Title  He will report able to walk for basic community activity with 1-2 max pain    Baseline  Able  but pain varies and can be severe at times 5-9/10 after walk of  a block    Status  On-going            Plan - 12/18/19 1334    Clinical Impression Statement  Continues with moderate to severe reports of pain but continues to exercise at a high level without increased pain.   Trial of traction today as he contiues to report pressure as a symptom og=f his back pain. He reported traction felt good but will assess next session.    PT Treatment/Interventions  Electrical Stimulation;Moist Heat;Therapeutic exercise;Therapeutic activities;Patient/family education;Manual techniques;Passive range of motion;Dry needling;Taping    PT Next Visit Plan  Manual and modalities , review HEP and add as needed    PT Home Exercise Plan  PPT LTR, diapragmanmatic breathing.   car/camel,    childs pose, knee to chest, bridge , clam,m band green row/rotation trunk and  shoulder extension.    Consulted and Agree with Plan of Care  Patient       Patient will benefit from skilled therapeutic  intervention in order to improve the following deficits and impairments:  Decreased range of motion, Difficulty walking, Pain, Increased muscle spasms, Decreased activity tolerance, Decreased strength  Visit Diagnosis: Muscle spasm of back  Chronic bilateral low back pain, unspecified whether sciatica present  Joint stiffness of spine     Problem List Patient Active Problem List   Diagnosis Date Noted  . Migraine without aura and without status migrainosus, not intractable 08/03/2019  .  Current severe episode of major depressive disorder with psychotic features ([place]) 08/03/2019  . Lumbar radiculopathy 06/29/2019  . Right shoulder pain 01/29/2018  . Hyperlipidemia 08/05/2017  . Anxiety and depression 08/05/2017  . Hypertension 08/05/2017  . Diabetes mellitus screening 04/01/2017  . S/P craniotomy 01/27/2016  . Benign meningioma ([place]) 01/27/2016  . CKD (chronic kidney disease) stage 2, GFR 60-89 ml/min 01/18/2016  . Tobacco abuse 01/18/2016  . Seizures ([place])   . GERD (gastroesophageal reflux disease) 04/13/2013    Darrel Hoover PT 12/18/2019, 2:11 PM  [facility] [address] [place], [place], [address] Phone: [phone]   Fax:  [phone]  Name: Jeremy Soto MRN: [id number] Date of Birth: 04/20/1966

## 2019-12-20 ENCOUNTER — Ambulatory Visit: Payer: Medicaid Other

## 2019-12-25 ENCOUNTER — Other Ambulatory Visit: Payer: Self-pay

## 2019-12-25 ENCOUNTER — Ambulatory Visit: Payer: Medicaid Other

## 2019-12-25 DIAGNOSIS — M62830 Muscle spasm of back: Secondary | ICD-10-CM

## 2019-12-25 DIAGNOSIS — G8929 Other chronic pain: Secondary | ICD-10-CM

## 2019-12-25 DIAGNOSIS — M2560 Stiffness of unspecified joint, not elsewhere classified: Secondary | ICD-10-CM

## 2019-12-25 NOTE — Patient Instructions (Signed)
quadra ped arm /leg lifts RT and LT x 10 hold 3-5 sec  daily

## 2019-12-25 NOTE — Therapy (Addendum)
[place], [place], [address] Phone: [phone]   Fax:  [phone]  Physical Therapy Treatment/Discharge  Patient Details  Name: Jeremy Soto MRN: [id number] Date of Birth: August 30, 1966 Referring Provider (PT): Almyra Free, MD   Encounter Date: 12/25/2019  PT End of Session - 12/25/19 1331    Visit Number  11    Number of Visits  16    Date for PT Re-Evaluation  12/28/19    Authorization Type  MCD    Authorization Time Period  2/4 to 01/02/20    Authorization - Visit Number  3    Authorization - Number of Visits  8    PT Start Time  0127    PT Stop Time  0210    PT Time Calculation (min)  43 min    Activity Tolerance  Patient tolerated treatment well;No increased pain    Behavior During Therapy  WFL for tasks assessed/performed       Past Medical History:  Diagnosis Date  . Anxiety   . Arthritis   . Asthma    as a child  . Brain tumor ([place])   . Cancer [facility])    brain tumor  . Chronic kidney disease   . Constipation   . Depression   . GERD (gastroesophageal reflux disease)   . Head injury due to trauma    dropped chain saw on head- had staples  . Headache   . Hyperlipidemia   . Peptic ulcer   . Seizure ([place])   . Shortness of breath dyspnea    with exertion   . Sickle cell trait ([place])   . Sleep apnea   . Stroke ([place])   . Substance abuse [facility])     Past Surgical History:  Procedure Laterality Date  . CLOSED REDUCTION NASAL FRACTURE N/A 06/04/2014   Procedure: CLOSED REDUCTION NASAL FRACTURE;  Surgeon: Ascencion Dike, MD;  Location: [facility];  Service: ENT;  Laterality: N/A;  . CRANIOTOMY Right 01/27/2016   Procedure: RIGHT FRONTAL CRANIOTOMY FOR MENINGIOMA;  Surgeon: Kristeen Miss, MD;  Location: [place] [facility];  Service: Neurosurgery;  Laterality: Right;  RIGHT FRONTAL CRANIOTOMY FOR MENINGIOMA  . DIAGNOSTIC LAPAROSCOPY     - stomach  . ESOPHAGOGASTRODUODENOSCOPY  ENDOSCOPY      There were no vitals filed for this visit.  Subjective Assessment - 12/25/19 1416    Subjective  7/10 today    Pain Score  7     Pain Location  Back    Pain Orientation  Right;Left;Lower    Pain Descriptors / Indicators  Pressure    Pain Type  Chronic pain    Pain Onset  More than a month ago    Pain Frequency  Constant                       [facility] Adult PT Treatment/Exercise - 12/25/19 0001      Lumbar Exercises: Aerobic   Nustep  L7 UE/LE 6  min      Lumbar Exercises: Standing   Row  Both;20 reps    Row Limitations  Black band    Shoulder Extension  Both;20 reps    Shoulder Extension Limitations  black band    Other Standing Lumbar Exercises  rotation x 20 RT/LT  black band      Lumbar Exercises: Supine   Bent Knee Raise  15 reps    Bent Knee Raise Limitations  RT/LT blackband around knees    Bridge  15 reps    Bridge Limitations  black band ove rhips      Lumbar Exercises: Sidelying   Clam  Right;Left;20 reps    Clam Limitations  black band    Hip Abduction  Right;Left;10 reps    Hip Abduction Limitations  black band      Lumbar Exercises: Quadruped   Opposite Arm/Leg Raise  Right arm/Left leg;Left arm/Right leg;10 reps      Moist Heat Therapy   Number Minutes Moist Heat  15 Minutes    Moist Heat Location  Lumbar Spine   with traction     Traction   Type of Traction  Lumbar    Min (lbs)  25    Max (lbs)  80    Hold Time  90    Rest Time  20    Time  15             PT Education - 12/25/19 1405    Education Details  quadraped exercises    Person(s) Educated  Patient    Methods  Explanation;Tactile cues;Verbal cues;Handout    Comprehension  Returned demonstration;Verbalized understanding       PT Short Term Goals - 11/01/19 1212      PT SHORT TERM GOAL #1   Title  He will be indpendent with initial hEP.    Status  Achieved      PT SHORT TERM GOAL #2   Title  He will report pain eased 10-20% generally.     Baseline  overall pain 20% better or more Still takes meds    Status  Achieved        PT Long Term Goals - 12/25/19 1404      PT LONG TERM GOAL #1   Title  He will be indpendent with all issued HEP.    Baseline  indpendent with initial HEP    Status  Achieved      PT LONG TERM GOAL #2   Title  He will report pain as intermittant and decreased 50% or more    Baseline  Last time without back pain was last week part of one day.    Status  On-going      PT LONG TERM GOAL #3   Title  He will report able to wake with no pain    Baseline  Still problematic    Status  On-going      PT LONG TERM GOAL #4   Title  He will report able to walk for basic community activity with 1-2 max pain    Baseline  Able  but pain varies and can be severe at times 5-9/10 after walk of  a block    Status  On-going            Plan - 12/25/19 1332    Clinical Impression Statement  Continues to report high pain levels but works hard during session.  Will finish this week then discharge    PT Treatment/Interventions  Electrical Stimulation;Moist Heat;Therapeutic exercise;Therapeutic activities;Patient/family education;Manual techniques;Passive range of motion;Dry needling;Taping    PT Next Visit Plan  Manual and modalities , review HEP and add as needed    PT Home Exercise Plan  PPT LTR, diapragmanmatic breathing.   car/camel,    childs pose, knee to chest, bridge , clam,m band green row/rotation trunk and  shoulder extension., Quadraped arm /leg lift    Consulted and Agree with Plan of  Care  Patient       Patient will benefit from skilled therapeutic intervention in order to improve the following deficits and impairments:  Decreased range of motion, Difficulty walking, Pain, Increased muscle spasms, Decreased activity tolerance, Decreased strength  Visit Diagnosis: Muscle spasm of back  Chronic bilateral low back pain, unspecified whether sciatica present  Joint stiffness of  spine     Problem List Patient Active Problem List   Diagnosis Date Noted  . Migraine without aura and without status migrainosus, not intractable 08/03/2019  . 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 Lumbar radiculopathy 06/29/2019  . Right shoulder pain 01/29/2018  . Hyperlipidemia 08/05/2017  . Anxiety and depression 08/05/2017  . Hypertension 08/05/2017  . Diabetes mellitus screening 04/01/2017  . S/P craniotomy 01/27/2016  . Benign meningioma ([place]) 01/27/2016  . CKD (chronic kidney disease) stage 2, GFR 60-89 ml/min 01/18/2016  . Tobacco abuse 01/18/2016  . Seizures ([place])   . GERD (gastroesophageal reflux disease) 04/13/2013    Darrel Hoover  PT 12/25/2019, 2:18 PM  [facility] [address] [place], [place], [address] Phone: [phone]   Fax:  [phone]  Name: Jeremy Soto MRN: [id number] Date of Birth: 27-Jan-1966  PHYSICAL THERAPY DISCHARGE SUMMARY  Visits from Start of Care: 11  Current functional level related to goals / functional outcomes: He has no showed 2 reassessments and called today to cancel today's appointment stating he was not able to make it and he had a foot problem with a torn ligament he may have to have surgery for the tear. I asked him to return to his PCP for assessment before return to PT . His pain levels are always high but he is able to do a vigorous workout without increased pain.    Remaining deficits  He reports doing bette r  but does not repot any improvement in pain with levels at 7/10.    Education / Equipment: He has a extensive HEP and he should continue them at home . I don't think we have anything more to offer him. Plan: Patient agrees to discharge.  Patient goals were partially met. Patient is being discharged due to lack of progress.  ?????  Pearson Forster PT   01/17/20

## 2019-12-27 ENCOUNTER — Ambulatory Visit: Payer: Medicaid Other

## 2020-01-07 ENCOUNTER — Ambulatory Visit: Payer: Medicaid Other

## 2020-01-09 ENCOUNTER — Other Ambulatory Visit: Payer: Self-pay

## 2020-01-09 ENCOUNTER — Emergency Department (HOSPITAL_COMMUNITY)
Admission: EM | Admit: 2020-01-09 | Discharge: 2020-01-09 | Disposition: A | Discharge status: Home / Self Care | Payer: Medicaid Other | Attending: Emergency Medicine | Admitting: Emergency Medicine

## 2020-01-09 ENCOUNTER — Encounter (HOSPITAL_COMMUNITY): Payer: Self-pay | Admitting: Emergency Medicine

## 2020-01-09 DIAGNOSIS — Z85841 Personal history of malignant neoplasm of brain: Secondary | ICD-10-CM | POA: Diagnosis not present

## 2020-01-09 DIAGNOSIS — I129 Hypertensive chronic kidney disease with stage 1 through stage 4 chronic kidney disease, or unspecified chronic kidney disease: Secondary | ICD-10-CM | POA: Insufficient documentation

## 2020-01-09 DIAGNOSIS — M722 Plantar fascial fibromatosis: Secondary | ICD-10-CM | POA: Insufficient documentation

## 2020-01-09 DIAGNOSIS — Z79899 Other long term (current) drug therapy: Secondary | ICD-10-CM | POA: Insufficient documentation

## 2020-01-09 DIAGNOSIS — N182 Chronic kidney disease, stage 2 (mild): Secondary | ICD-10-CM | POA: Diagnosis not present

## 2020-01-09 DIAGNOSIS — Z8669 Personal history of other diseases of the nervous system and sense organs: Secondary | ICD-10-CM | POA: Insufficient documentation

## 2020-01-09 DIAGNOSIS — F17210 Nicotine dependence, cigarettes, uncomplicated: Secondary | ICD-10-CM | POA: Diagnosis not present

## 2020-01-09 DIAGNOSIS — M79671 Pain in right foot: Secondary | ICD-10-CM | POA: Diagnosis present

## 2020-01-09 MED ORDER — DICLOFENAC SODIUM 1 % EX GEL: 2.0000 g | Freq: Four times a day (QID) | CUTANEOUS | 0 refills | Status: DC

## 2020-01-09 MED ORDER — OXYCODONE-ACETAMINOPHEN 5-325 MG PO TABS: 1.0000 | ORAL_TABLET | Freq: Once | ORAL | Status: DC

## 2020-01-09 NOTE — Discharge Instructions (Signed)
Use the Voltaren gel as directed. You can use a frozen tennis ball to roll under your feet to help with pain as well. Follow-up with the foot specialist listed below. Return to the ED if you start to experience worsening pain, swelling of your joints, numbness in your foot.

## 2020-01-09 NOTE — ED Triage Notes (Signed)
C/o R foot pain from arch to heel x 4 months.  No known injury.

## 2020-01-09 NOTE — ED Provider Notes (Signed)
[facility] EMERGENCY DEPARTMENT Provider Note   CSN: [id number] Arrival date & time: 01/09/20  1212     History Chief Complaint  Patient presents with  . Foot Pain    Jeremy Soto is a 54 y.o. male with a past medical history of GERD, anxiety, arthritis presenting to the ED with a chief complaint of right foot pain.  Reports intermittent sharp pain in his right foot from the arch of his foot to the heel for the past 4 months.  States that pain is worse when he ambulates.  He has not tried any medications to help with his pain.  He denies any injury or falls, swelling, trouble moving the foot, numbness.  HPI     Past Medical History:  Diagnosis Date  . Anxiety   . Arthritis   . Asthma    as a child  . Brain tumor ([place])   . Cancer [facility])    brain tumor  . Chronic kidney disease   . Constipation   . Depression   . GERD (gastroesophageal reflux disease)   . Head injury due to trauma    dropped chain saw on head- had staples  . Headache   . Hyperlipidemia   . Peptic ulcer   . Seizure ([place])   . Shortness of breath dyspnea    with exertion   . Sickle cell trait ([place])   . Sleep apnea   . Stroke ([place])   . Substance abuse [facility])     Patient Active Problem List   Diagnosis Date Noted  . Migraine without aura and without status migrainosus, not intractable 08/03/2019  . Current severe episode of major depressive disorder with psychotic features ([place]) 08/03/2019  . Lumbar radiculopathy 06/29/2019  . Right shoulder pain 01/29/2018  . Hyperlipidemia 08/05/2017  . Anxiety and depression 08/05/2017  . Hypertension 08/05/2017  . Diabetes mellitus screening 04/01/2017  . S/P craniotomy 01/27/2016  . Benign meningioma ([place]) 01/27/2016  . CKD (chronic kidney disease) stage 2, GFR 60-89 ml/min 01/18/2016  . Tobacco abuse 01/18/2016  . Seizures ([place])   . GERD (gastroesophageal reflux disease) 04/13/2013    Past Surgical History:  Procedure Laterality  Date  . CLOSED REDUCTION NASAL FRACTURE N/A 06/04/2014   Procedure: CLOSED REDUCTION NASAL FRACTURE;  Surgeon: Ascencion Dike, MD;  Location: [facility];  Service: ENT;  Laterality: N/A;  . CRANIOTOMY Right 01/27/2016   Procedure: RIGHT FRONTAL CRANIOTOMY FOR MENINGIOMA;  Surgeon: Kristeen Miss, MD;  Location: [place] [facility];  Service: Neurosurgery;  Laterality: Right;  RIGHT FRONTAL CRANIOTOMY FOR MENINGIOMA  . DIAGNOSTIC LAPAROSCOPY     - stomach  . ESOPHAGOGASTRODUODENOSCOPY ENDOSCOPY         Family History  Problem Relation Age of Onset  . Diabetes Maternal Grandmother   . Depression Maternal Grandmother   . Diabetes Paternal Grandfather   . Hypertension Paternal Grandfather   . Hypertension Father   . Diabetes Father   . Sickle cell trait Father   . Hypertension Mother   . Diabetes Mother   . Sickle cell trait Mother   . Depression Mother   . Depression Maternal Grandfather   . Psychosis Nephew   . Colon cancer Neg Hx   . Rectal cancer Neg Hx   . Stomach cancer Neg Hx   . Esophageal cancer Neg Hx     Social History   Tobacco Use  . Smoking status: Current Every Day Smoker    Packs/day: 0.25  Years: 20.00    Pack years: 5.00    Types: Cigarettes  . Smokeless tobacco: Never Used  Substance Use Topics  . Alcohol use: Yes    Alcohol/week: 1.0 standard drinks    Types: 1 Cans of beer per week  . Drug use: Yes    Types: Cocaine    Home Medications Prior to Admission medications   Medication Sig Start Date End Date Taking? Authorizing Provider  atorvastatin (LIPITOR) 20 MG tablet Take 1 tablet (20 mg total) by mouth daily. 07/04/19   Ladell Pier, MD  cyclobenzaprine (FLEXERIL) 5 MG tablet TAKE 1 TABLET BY MOUTH THREE TIMES A DAY AS NEEDED FOR MUSCLE SPASMS 09/07/19   Ladell Pier, MD  diclofenac Sodium (VOLTAREN) 1 % GEL Apply 2 g topically 4 (four) times daily. 01/09/20   Ladajah Soltys, PA-C  HYDROcodone-acetaminophen (NORCO/VICODIN) 5-325 MG  tablet Take 1 tablet by mouth every 6 (six) hours as needed for moderate pain.    [provider]  levETIRAcetam (KEPPRA) 1000 MG tablet Take 1 tablet (1,000 mg total) by mouth 2 (two) times daily. 07/04/19   Ladell Pier, MD  traMADol (ULTRAM) 50 MG tablet Take 1 tablet (50 mg total) by mouth every 8 (eight) hours as needed. Patient not taking: Reported on 10/24/2019 08/03/19   Ladell Pier, MD    Allergies    Patient has no known allergies.  Review of Systems   Review of Systems  Constitutional: Negative for chills and fever.  Musculoskeletal: Positive for myalgias. Negative for arthralgias and joint swelling.  Skin: Negative for wound.  Neurological: Negative for weakness and numbness.    Physical Exam Updated Vital Signs BP (!) 131/95 (BP Location: Right Arm)   Pulse 91   Temp 98.3 F (36.8 C) (Oral)   Resp 18   SpO2 95%   Physical Exam Vitals and nursing note reviewed.  Constitutional:      General: He is not in acute distress.    Appearance: He is well-developed. He is not diaphoretic.  HENT:     Head: Normocephalic and atraumatic.  Eyes:     General: No scleral icterus.    Conjunctiva/sclera: Conjunctivae normal.  Pulmonary:     Effort: Pulmonary effort is normal. No respiratory distress.  Musculoskeletal:     Cervical back: Normal range of motion.     Comments: Minimal tenderness to palpation of the bottom of the right foot without overlying skin changes.  Sensation intact to light touch of bilateral lower extremities.  2+ DP pulses palpated bilaterally.  Full active and passive range of digits, ankle of right foot.  Compartments are soft.  Skin:    Findings: No rash.  Neurological:     Mental Status: He is alert.     ED Results / Procedures / Treatments   Labs (all labs ordered are listed, but only abnormal results are displayed) Labs Reviewed - No data to display  EKG None  Radiology No results found.  Procedures Procedures  (including critical care time)  Medications Ordered in ED Medications  oxyCODONE-acetaminophen (PERCOCET/ROXICET) 5-325 MG per tablet 1 tablet (has no administration in time range)    ED Course  I have reviewed the triage vital signs and the nursing notes.  Pertinent labs & imaging results that were available during my care of the patient were reviewed by me and considered in my medical decision making (see chart for details).    MDM Rules/Calculators/A&P  54 year old male presents to ED with a chief complaint of right foot pain.  Reports pain to the bottom of his right foot that has been intermittent for the past 4 months, worse with ambulation.  On exam there is mild tenderness palpation of the bottom of the right foot but areas neurovascularly intact without overlying skin changes.  Good pulses noted.  Normal range of motion of joints.  Suspect that his symptoms are due to plantar fasciitis.  Doubt infectious or vascular cause of symptoms.  Will prescribe anti-inflammatories, advise cryotherapy and orthopedic follow-up.  Patient is hemodynamically stable, in NAD, and able to ambulate in the ED. Evaluation does not show pathology that would require ongoing emergent intervention or inpatient treatment. I have personally reviewed and interpreted all lab work and imaging at today's ED visit. I explained the diagnosis to the patient. Pain has been managed and has no complaints prior to discharge. Patient is comfortable with above plan and is stable for discharge at this time. All questions were answered prior to disposition. Strict return precautions for returning to the ED were discussed. Encouraged follow up with PCP.   An After Visit Summary was printed and given to the patient.   Portions of this note were generated with [employer]. Dictation errors may occur despite best attempts at proofreading.  Final Clinical Impression(s) / ED Diagnoses Final  diagnoses:  Plantar fasciitis of right foot    Rx / DC Orders ED Discharge Orders         Ordered    diclofenac Sodium (VOLTAREN) 1 % GEL  4 times daily     01/09/20 1321           Delia Heady, PA-C 01/09/20 1322    Lajean Saver, MD 01/10/20 0830

## 2020-01-17 ENCOUNTER — Ambulatory Visit: Payer: Medicaid Other

## 2020-03-06 ENCOUNTER — Encounter (HOSPITAL_COMMUNITY): Payer: Self-pay | Admitting: Psychology

## 2020-03-12 ENCOUNTER — Ambulatory Visit: Payer: Medicaid Other | Admitting: Family Medicine

## 2020-03-12 NOTE — Progress Notes (Deleted)
PATIENT: Jeremy Soto DOB: 10/28/1966  REASON FOR VISIT: follow up HISTORY FROM: patient  No chief complaint on file.    HISTORY OF PRESENT ILLNESS: Today 03/12/20 Jeremy Soto is a 54 y.o. male here today for follow up for seizures. He continues levetiracetam 1000mg  twice daily. He was started on divalproex at last follow up due to concerns of headaches and breakthrough seizures.   HISTORY: (copied from Dr Jannifer Franklin' note on 09/13/2019)  Jeremy Soto is a 54 year old right-handed black male with a history of seizures following a resection of a meningioma in the right frontal area.  The patient has recently had MRI of the brain that shows minimal anterior frontal encephalomalacia.  The patient is on Keppra taking 1000 mg twice daily, he tolerates the drug fairly well.  The patient indicates that he is having 3-4 small seizures a year associated with a sensation coming over the right face, the patient will get confused and feel weak all over and tend to veer to the right when he has an event.  He believes that he may have had 1 generalized seizure over the last year, he cannot member when this was.  The patient also reports daily headache events that usually start in the right occipital area and go forward associated with photophobia and phonophobia and some nausea.  The patient also reports some low back pain, MRI of the lumbar spine done recently has not shown a significant issue structurally with the back.  The patient claims that his balance is off, he may stagger a bit.  He reports numbness on the right side of the body.  He returns to this office for an evaluation.  The patient does not operate a motor vehicle.    REVIEW OF SYSTEMS: Out of a complete 14 system review of symptoms, the patient complains only of the following symptoms, and all other reviewed systems are negative.  ALLERGIES: No Known Allergies  HOME MEDICATIONS: Outpatient Medications Prior to Visit  Medication Sig  Dispense Refill  . atorvastatin (LIPITOR) 20 MG tablet Take 1 tablet (20 mg total) by mouth daily. 30 tablet 6  . cyclobenzaprine (FLEXERIL) 5 MG tablet TAKE 1 TABLET BY MOUTH THREE TIMES A DAY AS NEEDED FOR MUSCLE SPASMS 30 tablet 1  . diclofenac Sodium (VOLTAREN) 1 % GEL Apply 2 g topically 4 (four) times daily. 100 g 0  . HYDROcodone-acetaminophen (NORCO/VICODIN) 5-325 MG tablet Take 1 tablet by mouth every 6 (six) hours as needed for moderate pain.    Marland Kitchen levETIRAcetam (KEPPRA) 1000 MG tablet Take 1 tablet (1,000 mg total) by mouth 2 (two) times daily. 60 tablet 6  . traMADol (ULTRAM) 50 MG tablet Take 1 tablet (50 mg total) by mouth every 8 (eight) hours as needed. (Patient not taking: Reported on 10/24/2019) 60 tablet 0   No facility-administered medications prior to visit.    PAST MEDICAL HISTORY: Past Medical History:  Diagnosis Date  . Anxiety   . Arthritis   . Asthma    as a child  . Brain tumor ([place])   . Cancer [facility])    brain tumor  . Chronic kidney disease   . Constipation   . Depression   . GERD (gastroesophageal reflux disease)   . Head injury due to trauma    dropped chain saw on head- had staples  . Headache   . Hyperlipidemia   . Peptic ulcer   . Seizure ([place])   . Shortness of breath dyspnea  with exertion   . Sickle cell trait ([place])   . Sleep apnea   . Stroke ([place])   . Substance abuse ([place])     PAST SURGICAL HISTORY: Past Surgical History:  Procedure Laterality Date  . CLOSED REDUCTION NASAL FRACTURE N/A 06/04/2014   Procedure: CLOSED REDUCTION NASAL FRACTURE;  Surgeon: Ascencion Dike, MD;  Location: [place];  Service: ENT;  Laterality: N/A;  . CRANIOTOMY Right 01/27/2016   Procedure: RIGHT FRONTAL CRANIOTOMY FOR MENINGIOMA;  Surgeon: Kristeen Miss, MD;  Location: [place] [facility];  Service: Neurosurgery;  Laterality: Right;  RIGHT FRONTAL CRANIOTOMY FOR MENINGIOMA  . DIAGNOSTIC LAPAROSCOPY     - stomach  . ESOPHAGOGASTRODUODENOSCOPY ENDOSCOPY       FAMILY HISTORY: Family History  Problem Relation Age of Onset  . Diabetes Maternal Grandmother   . Depression Maternal Grandmother   . Diabetes Paternal Grandfather   . Hypertension Paternal Grandfather   . Hypertension Father   . Diabetes Father   . Sickle cell trait Father   . Hypertension Mother   . Diabetes Mother   . Sickle cell trait Mother   . Depression Mother   . Depression Maternal Grandfather   . Psychosis Nephew   . Colon cancer Neg Hx   . Rectal cancer Neg Hx   . Stomach cancer Neg Hx   . Esophageal cancer Neg Hx     SOCIAL HISTORY: Social History   Socioeconomic History  . Marital status: Single    Spouse name: Not on file  . Number of children: 2  . Years of education: 80  . Highest education level: Not on file  Occupational History  . Occupation: Disability    Employer: [employer]  Tobacco Use  . Smoking status: Current Every Day Smoker    Packs/day: 0.25    Years: 20.00    Pack years: 5.00    Types: Cigarettes  . Smokeless tobacco: Never Used  Substance and Sexual Activity  . Alcohol use: Yes    Alcohol/week: 1.0 standard drinks    Types: 1 Cans of beer per week  . Drug use: Yes    Types: Cocaine  . Sexual activity: Yes  Other Topics Concern  . Not on file  Social History Narrative   Lives with girlfriend   Caffeine use: daily   Right handed    Social Determinants of Health   Financial Resource Strain:   . Difficulty of Paying Living Expenses:   Food Insecurity:   . Worried About Charity fundraiser in the Last Year:   . Arboriculturist in the Last Year:   Transportation Needs:   . Film/video editor (Medical):   Marland Kitchen Lack of Transportation (Non-Medical):   Physical Activity:   . Days of Exercise per Week:   . Minutes of Exercise per Session:   Stress:   . Feeling of Stress :   Social Connections:   . Frequency of Communication with Friends and Family:   . Frequency of Social Gatherings with Friends and Family:   . Attends  Religious Services:   . Active Member of Clubs or Organizations:   . Attends Archivist Meetings:   Marland Kitchen Marital Status:   Intimate Partner Violence:   . Fear of Current or Ex-Partner:   . Emotionally Abused:   Marland Kitchen Physically Abused:   . Sexually Abused:       PHYSICAL EXAM  There were no vitals filed for this visit. There is no  height or weight on file to calculate BMI.  Generalized: Well developed, in no acute distress  Cardiology: normal rate and rhythm, no murmur noted Respiratory: clear to auscultation bilaterally  Neurological examination  Mentation: Alert oriented to time, place, history taking. Follows all commands speech and language fluent Cranial nerve II-XII: Pupils were equal round reactive to light. Extraocular movements were full, visual field were full on confrontational test. Facial sensation and strength were normal. Uvula tongue midline. Head turning and shoulder shrug  were normal and symmetric. Motor: The motor testing reveals 5 over 5 strength of all 4 extremities. Good symmetric motor tone is noted throughout.  Sensory: Sensory testing is intact to soft touch on all 4 extremities. No evidence of extinction is noted.  Coordination: Cerebellar testing reveals good finger-nose-finger and heel-to-shin bilaterally.  Gait and station: Gait is normal. Tandem gait is normal. Romberg is negative. No drift is seen.  Reflexes: Deep tendon reflexes are symmetric and normal bilaterally.   DIAGNOSTIC DATA (LABS, IMAGING, TESTING) - I reviewed patient records, labs, notes, testing and imaging myself where available.  No flowsheet data found.   Lab Results  Component Value Date   WBC 5.3 06/29/2019   HGB 12.5 (L) 06/29/2019   HCT 36.3 (L) 06/29/2019   MCV 93 06/29/2019   PLT 203 06/29/2019      Component Value Date/Time   NA 138 06/29/2019 1201   K 4.3 06/29/2019 1201   CL 99 06/29/2019 1201   CO2 25 06/29/2019 1201   GLUCOSE 97 06/29/2019 1201   GLUCOSE  90 11/17/2016 0435   BUN 17 06/29/2019 1201   CREATININE 1.41 (H) 06/29/2019 1201   CREATININE 1.50 (H) 04/13/2013 1231   CALCIUM 9.5 06/29/2019 1201   PROT 7.3 06/29/2019 1201   ALBUMIN 4.9 06/29/2019 1201   AST 31 06/29/2019 1201   ALT 44 06/29/2019 1201   ALKPHOS 77 06/29/2019 1201   BILITOT 0.5 06/29/2019 1201   GFRNONAA 56 (L) 06/29/2019 1201   GFRAA 65 06/29/2019 1201   Lab Results  Component Value Date   CHOL 317 (H) 06/29/2019   HDL 36 (L) 06/29/2019   LDLCALC 219 (H) 06/29/2019   TRIG 312 (H) 06/29/2019   CHOLHDL 8.8 (H) 06/29/2019   Lab Results  Component Value Date   HGBA1C 5.0 04/01/2017   No results found for: VITAMINB12 No results found for: TSH     ASSESSMENT AND PLAN 54 y.o. year old male  has a past medical history of Anxiety, Arthritis, Asthma, Brain tumor ([place]), Cancer ([place]), Chronic kidney disease, Constipation, Depression, GERD (gastroesophageal reflux disease), Head injury due to trauma, Headache, Hyperlipidemia, Peptic ulcer, Seizure ([place]), Shortness of breath dyspnea, Sickle cell trait ([place]), Sleep apnea, Stroke ([place]), and Substance abuse ([place]). here with ***    ICD-10-CM   1. Seizures ([place])  R56.9   2. Migraine without aura and without status migrainosus, not intractable  G43.009        No orders of the defined types were placed in this encounter.    No orders of the defined types were placed in this encounter.     I spent 15 minutes with the patient. 50% of this time was spent counseling and educating patient on plan of care and medications.    Debbora Presto, FNP-C 03/12/2020, 1:05 PM [facility] [address], [place], [place] [address] [phone]

## 2020-03-12 NOTE — Progress Notes (Signed)
Jeremy Soto is a 55 y.o. male patient discharged from counseling as last seen for tx over 90 days ago.        [name], [facility]

## 2020-03-13 ENCOUNTER — Encounter: Payer: Self-pay | Admitting: Family Medicine

## 2020-03-18 IMAGING — MR MR HEAD WO/W CM
10 of 13 series · 30 of 48 positions shown · IV contrast (gadavist)
Comparison: Head CT 01/18/2016.  MRI 01/19/2016 and 02/17/2017

CLINICAL DATA: Chronic headache. History of meningioma with
craniotomy.

EXAM:
MRI HEAD WITHOUT AND WITH CONTRAST
TECHNIQUE: Multiplanar, multiecho pulse sequences of the brain and surrounding
structures were obtained without and with intravenous contrast.
CONTRAST:  10mL GADAVIST GADOBUTROL 1 MMOL/ML IV SOLN

[Series 2: DWI · axial · 3.0mm · 0.94mm/px · z∈[-34,+128]mm · 6 of 110 slices shown (1 of 2)]
[im 1/110]
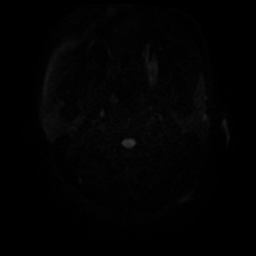
[im 22/110]
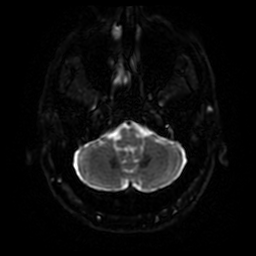
[im 44/110]
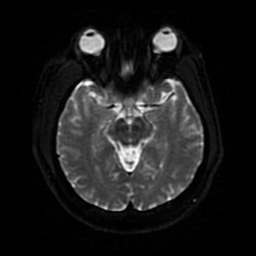
[im 66/110]
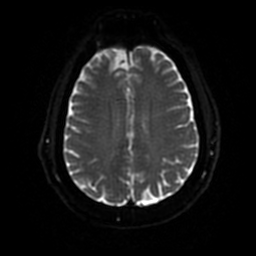
[im 88/110]
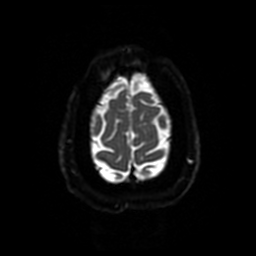
[im 110/110]
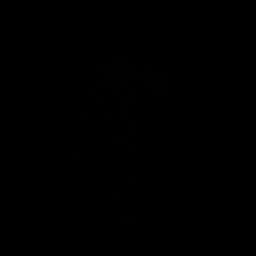

[Series 3: DWI · coronal · 4.0mm · 0.94mm/px · 5 of 78 slices shown (2 of 2)]
[im 1/78]
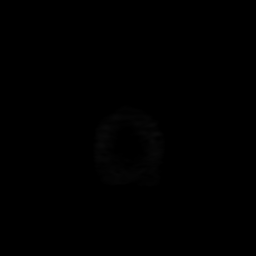
[im 20/78]
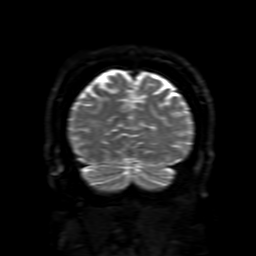
[im 39/78]
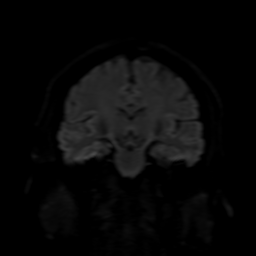
[im 58/78]
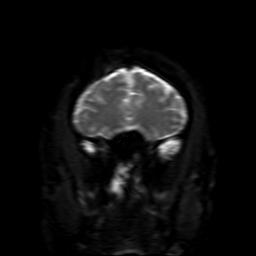
[im 78/78]
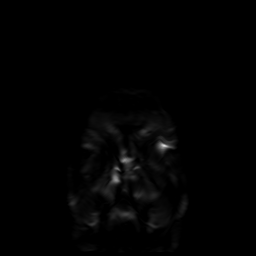

[Series 4: FLAIR · sagittal · 5.0mm · 0.47mm/px · 2 of 26 slices shown (1 of 2)]
[im 1/26]
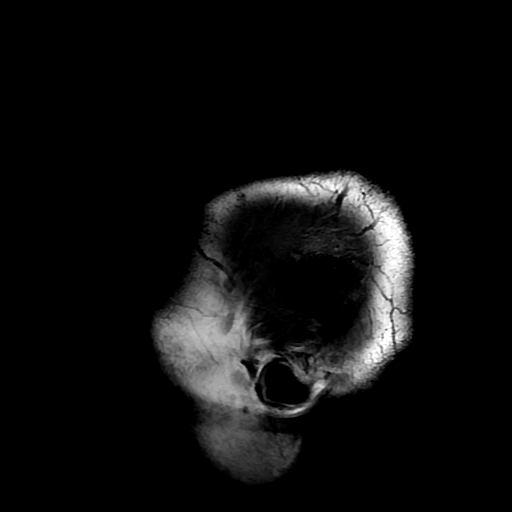
[im 26/26]
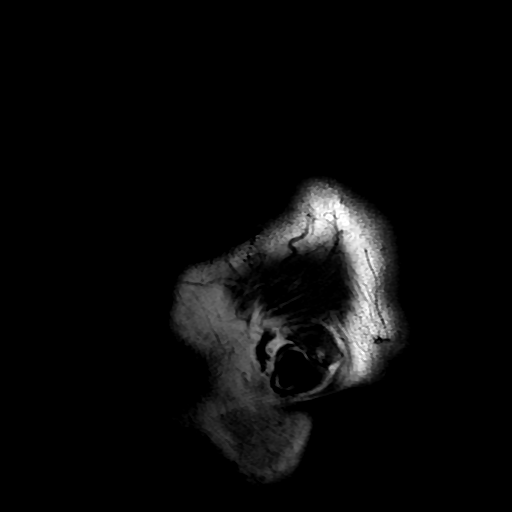

[Series 5: T2 · axial · 5.0mm · 0.47mm/px · z∈[-37,+130]mm · 2 of 29 slices shown]
[im 1/29]
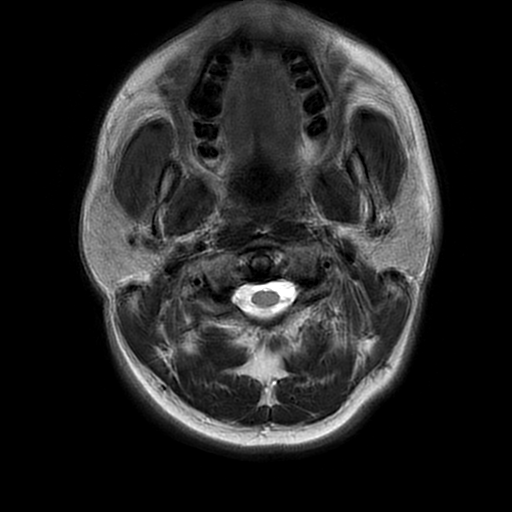
[im 29/29]
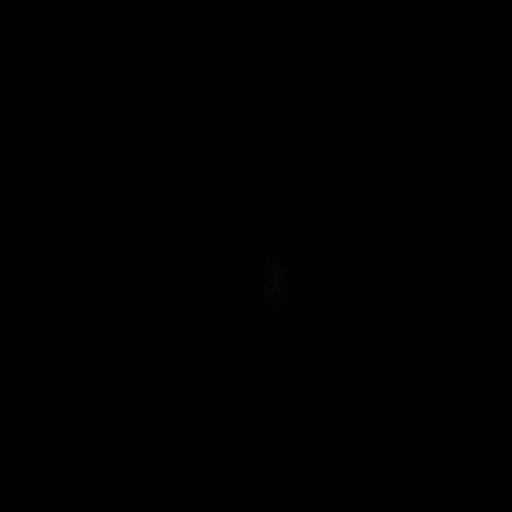

[Series 6: FLAIR · axial · 3.0mm · 0.45mm/px · z∈[-27,+129]mm · 2 of 27 slices shown (2 of 2)]
[im 1/27]
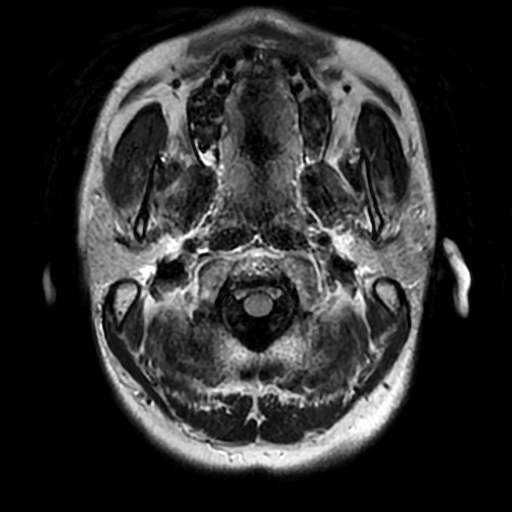
[im 27/27]
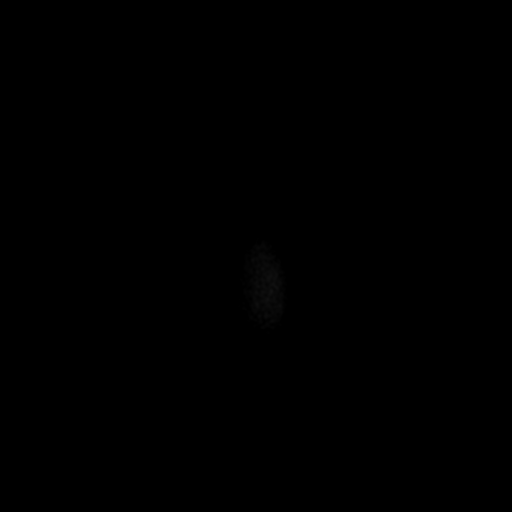

[Series 7: SWI · axial · 3.0mm · 0.47mm/px · z∈[-28,+51]mm · 4 of 108 slices shown]
[im 1/108]
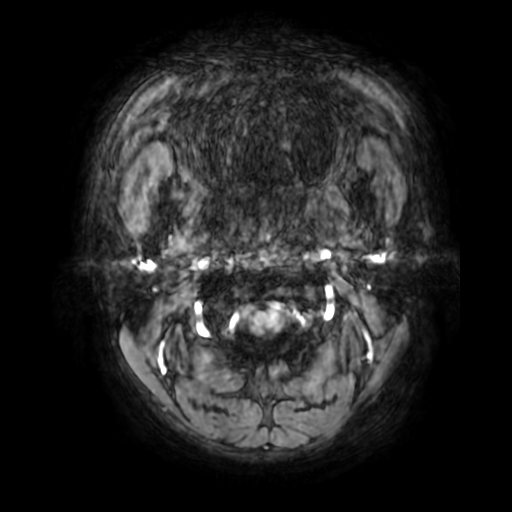
[im 18/108]
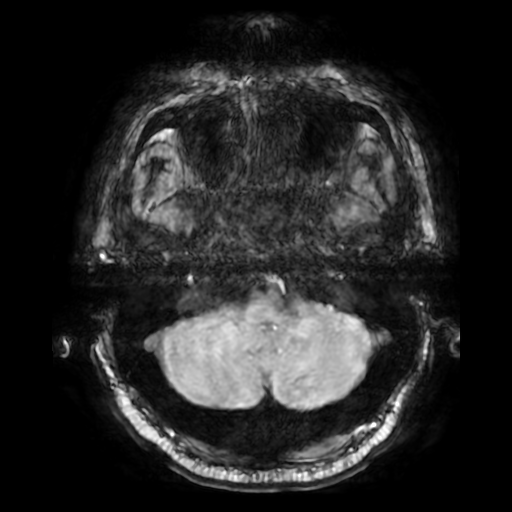
[im 36/108]
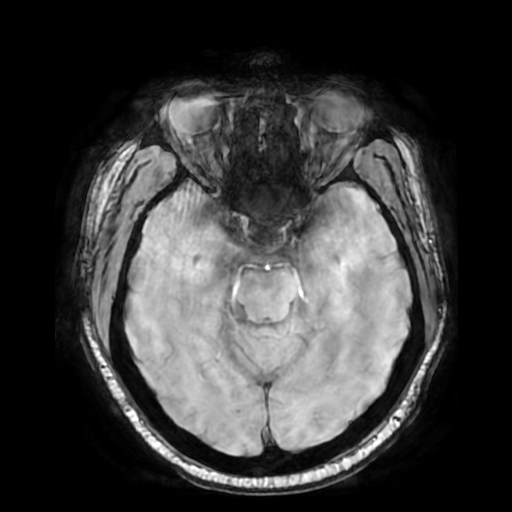
[im 54/108]
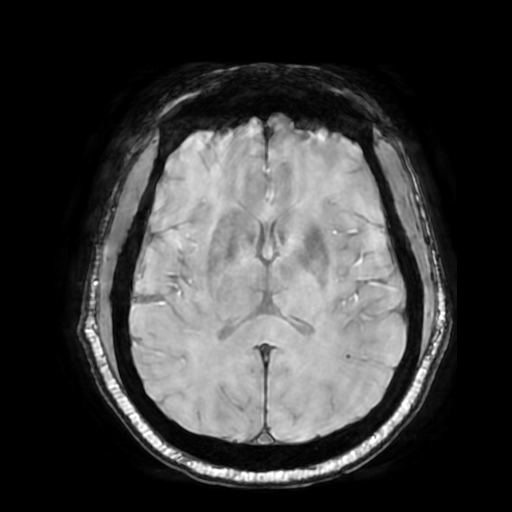

[Series 9: T2 post-contrast · coronal · 5.0mm · 0.39mm/px · 2 of 31 slices shown]
[im 1/31]
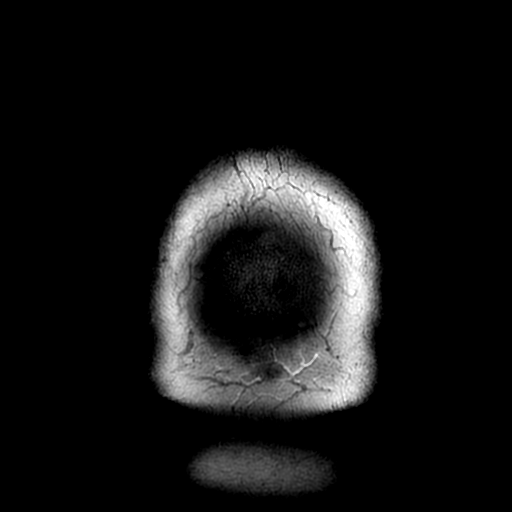
[im 31/31]
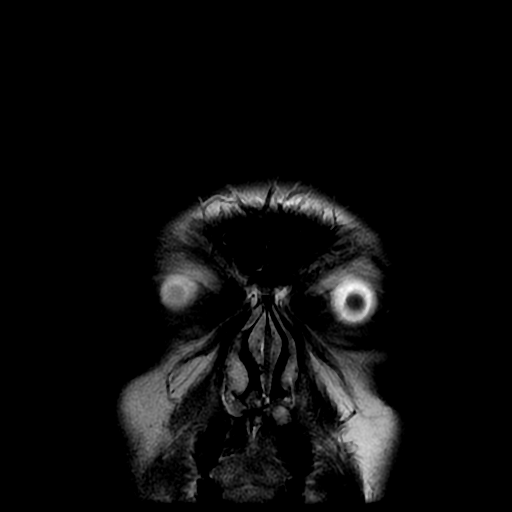

[Series 11: T1 post-contrast · coronal · 5.0mm · 0.43mm/px · 2 of 31 slices shown]
[im 1/31]
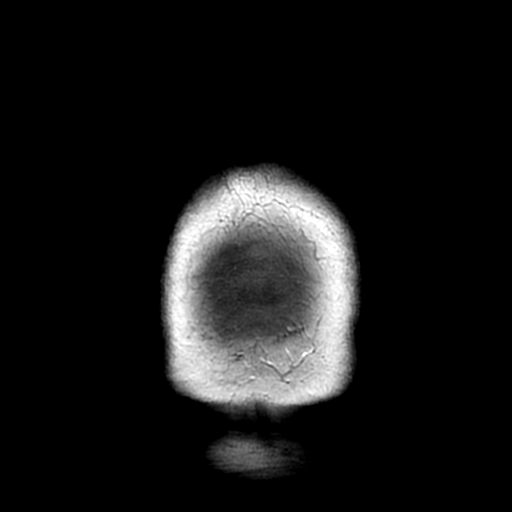
[im 31/31]
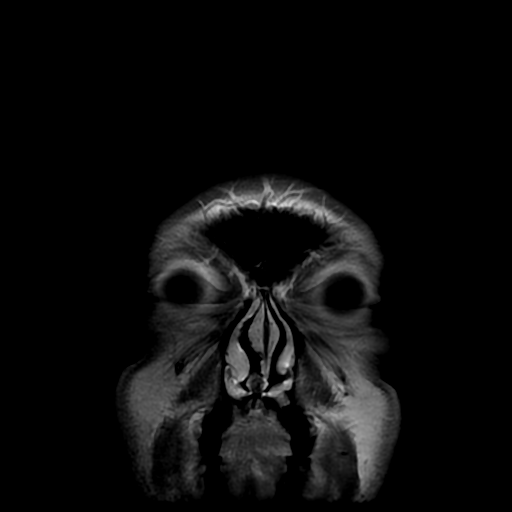

[Series 250: ADC · axial · 3.0mm · 0.94mm/px · z∈[-34,+125]mm · 3 of 54 slices shown (1 of 2)]
[im 1/54]
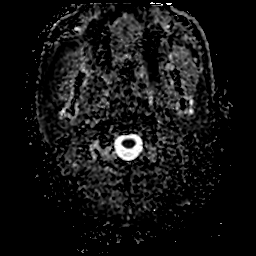
[im 27/54]
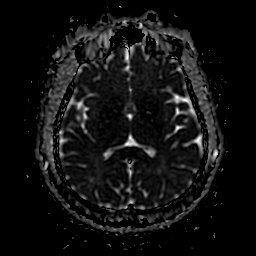
[im 54/54]
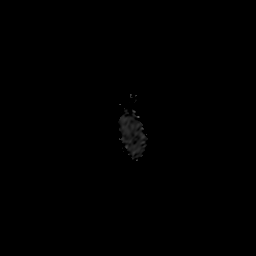

[Series 350: ADC · coronal · 4.0mm · 0.94mm/px · 2 of 39 slices shown (2 of 2)]
[im 1/39]
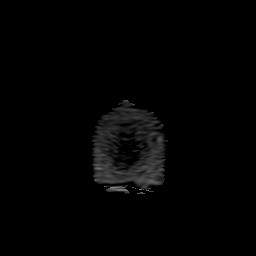
[im 39/39]
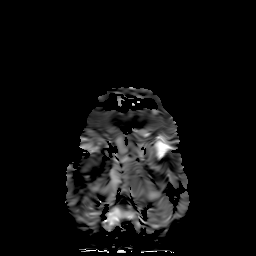

[30 of 48 positions shown; findings below may reference images not displayed]

FINDINGS: Brain: Diffusion imaging does not show any subacute or acute
infarction. Brainstem and cerebellum are normal. Cerebral
hemispheres show focal encephalomalacia at the medial right frontal
lobe secondary to previous tumor resection, unchanged since 2437.
There are a few scattered punctate foci of T2 and FLAIR signal in
the hemispheric white matter, not significant. No evidence of
residual or recurrent mass lesion, hemorrhage, hydrocephalus or
extra-axial collection. After contrast administration, no abnormal
enhancement occurs.

Vascular: Major vessels at the base of the brain show flow.

Skull and upper cervical spine: Right frontal craniotomy. Otherwise
negative.

Sinuses/Orbits: Clear/normal

Other: None
IMPRESSION: No acute or significant finding. Old right frontal craniotomy for
resection of meningioma. No residual or recurrent disease. Small
region of encephalomalacia and gliosis in the medial right frontal
lobe, unchanged since 2437.

Few scattered punctate foci of T2 and FLAIR signal within the
cerebral hemispheric white matter, not significantly changed. These
could be migraine related foci or indicate an early manifestation of
small vessel change.

## 2020-11-20 ENCOUNTER — Ambulatory Visit: Payer: Medicaid Other | Attending: Internal Medicine | Admitting: Internal Medicine

## 2020-11-20 ENCOUNTER — Other Ambulatory Visit: Payer: Self-pay

## 2020-11-20 DIAGNOSIS — G40909 Epilepsy, unspecified, not intractable, without status epilepticus: Secondary | ICD-10-CM

## 2020-11-20 DIAGNOSIS — F1910 Other psychoactive substance abuse, uncomplicated: Secondary | ICD-10-CM | POA: Diagnosis not present

## 2020-11-20 NOTE — Progress Notes (Signed)
Virtual Visit via Telephone Note  I connected with Jeremy Soto on 11/20/20 at 4:48 p.m by telephone and verified that I am speaking with the correct person using two identifiers.  Location: Patient: at [facility] Provider: office  The pt, my CMA MS. Sallyanne Havers and myself participated in this encounter I discussed the limitations, risks, security and privacy concerns of performing an evaluation and management service by telephone and the availability of in person appointments. I also discussed with the patient that there may be a patient responsible charge related to this service. The patient expressed understanding and agreed to proceed.   History of Present Illness: Pt with hx oftob dep, HL,migraines, mild anemia, depression/anxiety,meningioma status post craniotomy3/2017(initial pt was thought to have CVA but brain lesion discovered on MRI),generalized seizure diagnosed at the time of meningioma.  Last seen 08/2019  Pt currently in [place]  (2 days so far) for treatment of substance abuse (cocaine). Prior to this he was at a facility in [place] for 7 days.  SZ disorder: off Keppra for mths.  He states that he takes the medication only when he feels he may have a seizure coming on because he was told by some doctor in the past that it was causing damage to his liver.  He takes the medicine sometimes only once a month.  He has been off the medicine for over 2 months now.  He does not recall who told him that.  When he was following up with me, his liver enzymes were fine.  He thinks his last seizure episode was 2 months prior to Christmas.  He is wanting me to send refills on the Keppra but with instructions to take it only if needed.    Outpatient Encounter Medications as of 11/20/2020  Medication Sig  . atorvastatin (LIPITOR) 20 MG tablet Take 1 tablet (20 mg total) by mouth daily.  . cyclobenzaprine (FLEXERIL) 5 MG tablet TAKE 1 TABLET BY MOUTH THREE TIMES A DAY AS NEEDED FOR MUSCLE  SPASMS  . levETIRAcetam (KEPPRA) 1000 MG tablet Take 1 tablet (1,000 mg total) by mouth 2 (two) times daily.  . diclofenac Sodium (VOLTAREN) 1 % GEL Apply 2 g topically 4 (four) times daily. (Patient not taking: Reported on 11/20/2020)  . HYDROcodone-acetaminophen (NORCO/VICODIN) 5-325 MG tablet Take 1 tablet by mouth every 6 (six) hours as needed for moderate pain. (Patient not taking: Reported on 11/20/2020)  . traMADol (ULTRAM) 50 MG tablet Take 1 tablet (50 mg total) by mouth every 8 (eight) hours as needed. (Patient not taking: No sig reported)   No facility-administered encounter medications on file as of 11/20/2020.      Observations/Objective: No direct observation done as this was a telephone encounter.  Assessment and Plan: 1. Seizure disorder [facility]) I told patient that I cannot write for Keppra with instructions to take as needed.  Seizure medicines should be taken every day.  I told him that I will send the prescription to his pharmacy with instructions to take twice a day.  Patient then states that he did not want me to send that prescription anymore if he is not allowed to take it as needed because he does not and will not take it every day.  He knows that this puts him at risk for injury if he has seizure but patient still declined. -Advised to follow-up with me once discharged.  He states he will do so.  2. Substance abuse ([place]) Currently in a treatment program.   Follow  Up Instructions: 1 month   I discussed the assessment and treatment plan with the patient. The patient was provided an opportunity to ask questions and all were answered. The patient agreed with the plan and demonstrated an understanding of the instructions.   The patient was advised to call back or seek an in-person evaluation if the symptoms worsen or if the condition fails to improve as anticipated.  I provided 12 minutes of non-face-to-face time during this encounter.   Karle Plumber, MD

## 2020-11-21 ENCOUNTER — Telehealth: Payer: Self-pay

## 2020-11-21 ENCOUNTER — Other Ambulatory Visit: Payer: Self-pay | Admitting: Internal Medicine

## 2020-11-21 DIAGNOSIS — R569 Unspecified convulsions: Secondary | ICD-10-CM

## 2020-11-21 DIAGNOSIS — E785 Hyperlipidemia, unspecified: Secondary | ICD-10-CM

## 2020-11-21 NOTE — Telephone Encounter (Signed)
Medication:levETIRAcetam (KEPPRA) 1000 MG tablet [[id number]] , atorvastatin (LIPITOR) 20 MG tablet [[id number]] ,   Has the patient contacted their pharmacy? YES  (Agent: If no, request that the patient contact the pharmacy for the refill.) (Agent: If yes, when and what did the pharmacy advise?)  Preferred Pharmacy (with phone number or street name): [facility] [address] [place], [address] [phone]  Agent: Please be advised that RX refills may take up to 3 business days. We ask that you follow-up with your pharmacy.

## 2020-11-21 NOTE — Telephone Encounter (Signed)
Michelle with [facility] called again to speak with Jay'A again.  She stated she still needed some information regarding patient's BP medication.  Please call before 5pm if possible at [place] call and went over Dr. Wynetta Emery note in regards to seizure medication.   Per Sharyn Lull they will have to talk to the director and speak with the pt in regards to this because if he is not agreeable to taking the medication as prescribed they will have to discharge him

## 2020-11-21 NOTE — Telephone Encounter (Signed)
Summary: Called to inform the doctor where to send patient's meds  Reason for CRM: Called to inform the doctor that if patient needs any BP meds., please send to the [facility] in [place].

## 2020-11-22 NOTE — Telephone Encounter (Signed)
   Requested medications are on the active medication list yes   Last visit this week   Notes to clinic Kepra (pt wants ordered as needed per office note) Not Delegated Cholersterol med: Failed protocol due to no valid chol labs within 360 days.

## 2020-11-24 ENCOUNTER — Encounter (HOSPITAL_BASED_OUTPATIENT_CLINIC_OR_DEPARTMENT_OTHER): Payer: Self-pay | Admitting: *Deleted

## 2020-11-24 ENCOUNTER — Ambulatory Visit: Payer: Self-pay | Admitting: *Deleted

## 2020-11-24 ENCOUNTER — Other Ambulatory Visit: Payer: Self-pay

## 2020-11-24 ENCOUNTER — Telehealth: Payer: Self-pay | Admitting: Internal Medicine

## 2020-11-24 ENCOUNTER — Emergency Department (HOSPITAL_BASED_OUTPATIENT_CLINIC_OR_DEPARTMENT_OTHER)
Admission: EM | Admit: 2020-11-24 | Discharge: 2020-11-24 | Disposition: A | Discharge status: Home / Self Care | Payer: Medicaid Other | Attending: Emergency Medicine | Admitting: Emergency Medicine

## 2020-11-24 DIAGNOSIS — R509 Fever, unspecified: Secondary | ICD-10-CM | POA: Diagnosis present

## 2020-11-24 DIAGNOSIS — F17210 Nicotine dependence, cigarettes, uncomplicated: Secondary | ICD-10-CM | POA: Insufficient documentation

## 2020-11-24 DIAGNOSIS — N182 Chronic kidney disease, stage 2 (mild): Secondary | ICD-10-CM | POA: Insufficient documentation

## 2020-11-24 DIAGNOSIS — Z85841 Personal history of malignant neoplasm of brain: Secondary | ICD-10-CM | POA: Insufficient documentation

## 2020-11-24 DIAGNOSIS — U071 COVID-19: Secondary | ICD-10-CM | POA: Insufficient documentation

## 2020-11-24 DIAGNOSIS — J069 Acute upper respiratory infection, unspecified: Secondary | ICD-10-CM | POA: Diagnosis not present

## 2020-11-24 DIAGNOSIS — J45909 Unspecified asthma, uncomplicated: Secondary | ICD-10-CM | POA: Insufficient documentation

## 2020-11-24 DIAGNOSIS — Z79899 Other long term (current) drug therapy: Secondary | ICD-10-CM | POA: Diagnosis not present

## 2020-11-24 DIAGNOSIS — I129 Hypertensive chronic kidney disease with stage 1 through stage 4 chronic kidney disease, or unspecified chronic kidney disease: Secondary | ICD-10-CM | POA: Diagnosis not present

## 2020-11-24 LAB — SARS CORONAVIRUS 2 (TAT 6-24 HRS): SARS Coronavirus 2: POSITIVE — AB

## 2020-11-24 MED ORDER — ATORVASTATIN CALCIUM 20 MG PO TABS: 20.0000 mg | ORAL_TABLET | Freq: Every day | ORAL | 6 refills | Status: AC

## 2020-11-24 MED ORDER — IBUPROFEN 400 MG PO TABS
400.0000 mg | ORAL_TABLET | Freq: Once | ORAL | Status: AC
  Administered 2020-11-24: 400 mg via ORAL
  Filled 2020-11-24: qty 1

## 2020-11-24 MED ORDER — ACETAMINOPHEN 325 MG PO TABS
650.0000 mg | ORAL_TABLET | Freq: Once | ORAL | Status: AC
  Administered 2020-11-24: 650 mg via ORAL
  Filled 2020-11-24: qty 2

## 2020-11-24 NOTE — ED Triage Notes (Signed)
Fever, chills, cough, headache x 3 days. He was tested for Covid today and his results were negative. He is an inpatient at [facility]. He had a Covid exposure.

## 2020-11-24 NOTE — Telephone Encounter (Signed)
Suanne Marker, RN  -  Reporting high BP- 180/104, T 99.5, chills, body shakes. Rapid test COVID- negative. Nurse thinks patient may be having withdrawal from [place]. Advised that patient was offered Rx to be taken bid- and he refused/declined- he states he only wants to take PRN for his symptoms- she states they may send him to ED for evaluation of symptoms.  Visit Note:  I told patient that I cannot write for Keppra with instructions to take as needed.  Seizure medicines should be taken every day.  I told him that I will send the prescription to his pharmacy with instructions to take twice a day.  Patient then states that he did not want me to send that prescription anymore if he is not allowed to take it as needed because he does not and will not take it every day.  He knows that this puts him at risk for injury if he has seizure but patient still declined. -Advised to follow-up with me once discharged.  He states he will do so.     Reason for Disposition . [1] Prescription refill request for ESSENTIAL medicine (i.e., likelihood of harm to patient if not taken) AND [2] triager unable to refill per department policy  Answer Assessment - Initial Assessment Questions 1. NAME of MEDICATION: "What medicine are you calling about?"     Keppra 2. QUESTION: "What is your question?" (e.g., medication refill, side effect)     Request Rx for patient 3. PRESCRIBING HCP: "Who prescribed it?" Reason: if prescribed by specialist, call should be referred to that group.     unknown 4. SYMPTOMS: "Do you have any symptoms?"     Chills, body shakes, low grade temp  Protocols used: MEDICATION QUESTION CALL-A-AH

## 2020-11-24 NOTE — Discharge Instructions (Addendum)
Follow-up Covid test on your MyChart.  Please return if you have any worsening symptoms as discussed.

## 2020-11-24 NOTE — ED Triage Notes (Signed)
He was here this am with fever and headache. He was seen and sent back to [facility]. [facility] sent him back to the ER due to fever coming back. They did not give him a fever reducer.

## 2020-11-24 NOTE — Telephone Encounter (Signed)
FYI

## 2020-11-24 NOTE — ED Provider Notes (Signed)
[facility]GENCY DEPARTMENT Provider Note   CSN: [id number] Arrival date & time: 11/24/20  1125     History Chief Complaint  Patient presents with  . Fever  . Cough    Jeremy Soto is a 55 y.o. male.  The history is provided by the patient.  Fever Temp source:  Subjective Severity:  Mild Onset quality:  Gradual Timing:  Intermittent Progression:  Waxing and waning Chronicity:  New Relieved by:  Nothing Worsened by:  Nothing Associated symptoms: chills and congestion   Associated symptoms: no chest pain, no cough, no dysuria, no ear pain, no rash, no sore throat and no vomiting   Risk factors: sick contacts (covid exposure)        Past Medical History:  Diagnosis Date  . Anxiety   . Arthritis   . Asthma    as a child  . Brain tumor ([place])   . Cancer [facility])    brain tumor  . Chronic kidney disease   . Constipation   . Depression   . GERD (gastroesophageal reflux disease)   . Head injury due to trauma    dropped chain saw on head- had staples  . Headache   . Hyperlipidemia   . Peptic ulcer   . Seizure ([place])   . Shortness of breath dyspnea    with exertion   . Sickle cell trait ([place])   . Sleep apnea   . Stroke ([place])   . Substance abuse [facility])     Patient Active Problem List   Diagnosis Date Noted  . Migraine without aura and without status migrainosus, not intractable 08/03/2019  . 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 Lumbar radiculopathy 06/29/2019  . Right shoulder pain 01/29/2018  . Hyperlipidemia 08/05/2017  . Anxiety and depression 08/05/2017  . Hypertension 08/05/2017  . Diabetes mellitus screening 04/01/2017  . S/P craniotomy 01/27/2016  . Benign meningioma ([place]) 01/27/2016  . CKD (chronic kidney disease) stage 2, GFR 60-89 ml/min 01/18/2016  . Tobacco abuse 01/18/2016  . Seizures ([place])   . GERD (gastroesophageal reflux disease) 04/13/2013    Past Surgical  History:  Procedure Laterality Date  . CLOSED REDUCTION NASAL FRACTURE N/A 06/04/2014   Procedure: CLOSED REDUCTION NASAL FRACTURE;  Surgeon: Ascencion Dike, MD;  Location: [place];  Service: ENT;  Laterality: N/A;  . CRANIOTOMY Right 01/27/2016   Procedure: RIGHT FRONTAL CRANIOTOMY FOR MENINGIOMA;  Surgeon: Kristeen Miss, MD;  Location: [place] [facility]UR[facility];  Service: Neurosurgery;  Laterality: Right;  RIGHT FRONTAL CRANIOTOMY FOR MENINGIOMA  . DIAGNOSTIC LAPAROSCOPY     - stomach  . ESOPHAGOGASTRODUODENOSCOPY ENDOSCOPY         Family History  Problem Relation Age of Onset  . Diabetes Maternal Grandmother   . Depression Maternal Grandmother   . Diabetes Paternal Grandfather   . Hypertension Paternal Grandfather   . Hypertension Father   . Diabetes Father   . Sickle cell trait Father   . Hypertension Mother   . Diabetes Mother   . Sickle cell trait Mother   . Depression Mother   . Depression Maternal Grandfather   . Psychosis Nephew   . Colon cancer Neg Hx   . Rectal cancer Neg Hx   . Stomach cancer Neg Hx   . Esophageal cancer Neg Hx     Social History   Tobacco Use  . Smoking status: Current Every Day Smoker    Packs/day: 0.25  Years: 20.00    Pack years: 5.00    Types: Cigarettes  . Smokeless tobacco: Never Used  Vaping Use  . Vaping Use: Never used  Substance Use Topics  . Alcohol use: Yes    Alcohol/week: 1.0 standard drink    Types: 1 Cans of beer per week  . Drug use: Yes    Types: Cocaine    Home Medications Prior to Admission medications   Medication Sig Start Date End Date Taking? Authorizing Provider  atorvastatin (LIPITOR) 20 MG tablet Take 1 tablet (20 mg total) by mouth daily. 07/04/19   Ladell Pier, MD  cyclobenzaprine (FLEXERIL) 5 MG tablet TAKE 1 TABLET BY MOUTH THREE TIMES A DAY AS NEEDED FOR MUSCLE SPASMS 09/07/19   Ladell Pier, MD  levETIRAcetam (KEPPRA) 1000 MG tablet Take 1 tablet (1,000 mg total) by mouth 2 (two) times  daily. 07/04/19   Ladell Pier, MD    Allergies    Patient has no known allergies.  Review of Systems   Review of Systems  Constitutional: Positive for chills and fever.  HENT: Positive for congestion. Negative for ear pain and sore throat.   Eyes: Negative for pain and visual disturbance.  Respiratory: Negative for cough and shortness of breath.   Cardiovascular: Negative for chest pain and palpitations.  Gastrointestinal: Negative for abdominal pain and vomiting.  Genitourinary: Negative for dysuria and hematuria.  Musculoskeletal: Negative for arthralgias and back pain.  Skin: Negative for color change and rash.  Neurological: Negative for seizures and syncope.  All other systems reviewed and are negative.   Physical Exam Updated Vital Signs BP (!) 142/97   Pulse 93   Temp 99.9 F (37.7 C) (Oral)   Resp 14   Ht 6\' 3"  (1.905 m)   Wt 114.8 kg   SpO2 98%   BMI 31.62 kg/m   Physical Exam Vitals and nursing note reviewed.  Constitutional:      General: He is not in acute distress.    Appearance: He is well-developed and well-nourished. He is not ill-appearing.  HENT:     Head: Normocephalic and atraumatic.     Nose: Nose normal.     Mouth/Throat:     Mouth: Mucous membranes are moist.  Eyes:     Extraocular Movements: Extraocular movements intact.     Conjunctiva/sclera: Conjunctivae normal.     Pupils: Pupils are equal, round, and reactive to light.  Cardiovascular:     Rate and Rhythm: Normal rate and regular rhythm.     Pulses: Normal pulses.     Heart sounds: No murmur heard.   Pulmonary:     Effort: Pulmonary effort is normal. No respiratory distress.     Breath sounds: Normal breath sounds.  Abdominal:     Palpations: Abdomen is soft.     Tenderness: There is no abdominal tenderness.  Musculoskeletal:        General: No edema.     Cervical back: Neck supple.  Skin:    General: Skin is warm and dry.  Neurological:     Mental Status: He is  alert. Mental status is at baseline.  Psychiatric:        Mood and Affect: Mood and affect normal.     ED Results / Procedures / Treatments   Labs (all labs ordered are listed, but only abnormal results are displayed) Labs Reviewed  SARS CORONAVIRUS 2 (TAT 6-24 HRS)    EKG None  Radiology No results found.  Procedures  Procedures   Medications Ordered in ED Medications  acetaminophen (TYLENOL) tablet 650 mg (has no administration in time range)    ED Course  I have reviewed the triage vital signs and the nursing notes.  Pertinent labs & imaging results that were available during my care of the patient were reviewed by me and considered in my medical decision making (see chart for details).    MDM Rules/Calculators/A&P                          Jeremy Soto is a 55 year old male with history of seizure syndrome who presents to the ED with body aches, cough, fever.  Close exposure to someone with Covid.  Had a rapid Covid test that was negative.  Her here.  Otherwise normal vitals.  No signs of respiratory distress.  No GI symptoms.  Overall well-appearing.  Will give Tylenol and will do PCR test for Covid.  Suspect that he does have Covid.  Normal room air oxygenation and no signs of respiratory distress.  Understands return precautions and discharged in ED in good condition.  This chart was dictated using voice recognition software.  Despite best efforts to proofread,  errors can occur which can change the documentation meaning.   Final Clinical Impression(s) / ED Diagnoses Final diagnoses:  Upper respiratory tract infection, unspecified type    Rx / DC Orders ED Discharge Orders    None       Lennice Sites, DO 11/24/20 1309

## 2020-11-25 ENCOUNTER — Emergency Department (HOSPITAL_BASED_OUTPATIENT_CLINIC_OR_DEPARTMENT_OTHER)
Admission: EM | Admit: 2020-11-25 | Discharge: 2020-11-25 | Disposition: A | Discharge status: Home / Self Care | Payer: Medicaid Other | Source: Home / Self Care | Attending: Emergency Medicine | Admitting: Emergency Medicine

## 2020-11-25 ENCOUNTER — Telehealth: Payer: Self-pay

## 2020-11-25 DIAGNOSIS — U071 COVID-19: Secondary | ICD-10-CM

## 2020-11-25 NOTE — ED Provider Notes (Addendum)
[facility] Provider Note   CSN: [id number] Arrival date & time: 11/24/20  2102     History Chief Complaint  Patient presents with  . Fever    Jeremy Soto is a 55 y.o. male.  Patient is a 55 year old male with past medical history of polysubstance abuse, brain tumor, seizure disorder, hyperlipidemia.  Patient currently undergoing addiction treatment at [facility].  He was seen here earlier this morning and underwent a Covid test.  [facility], then sent back here again after he became febrile.  Patient's Covid test from this morning has returned positive.  Patient denies having any difficulty breathing or severe chest pain.  He does describe body aches, headache, and feeling generally unwell.  The history is provided by the patient.  Fever Max temp prior to arrival:  101 Temp source:  Oral Severity:  Moderate Timing:  Constant Progression:  Unchanged Chronicity:  New Relieved by:  Nothing Worsened by:  Nothing Ineffective treatments:  None tried Associated symptoms: cough, headaches and myalgias   Associated symptoms: no diarrhea and no sore throat        Past Medical History:  Diagnosis Date  . Anxiety   . Arthritis   . Asthma    as a child  . Brain tumor ([place])   . Cancer [facility])    brain tumor  . Chronic kidney disease   . Constipation   . Depression   . GERD (gastroesophageal reflux disease)   . Head injury due to trauma    dropped chain saw on head- had staples  . Headache   . Hyperlipidemia   . Peptic ulcer   . Seizure ([place])   . Shortness of breath dyspnea    with exertion   . Sickle cell trait ([place])   . Sleep apnea   . Stroke ([place])   . Substance abuse [facility])     Patient Active Problem List   Diagnosis Date Noted  . Migraine without aura and without status migrainosus, not intractable 08/03/2019  . 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  Lumbar radiculopathy 06/29/2019  . Right shoulder pain 01/29/2018  . Hyperlipidemia 08/05/2017  . Anxiety and depression 08/05/2017  . Hypertension 08/05/2017  . Diabetes mellitus screening 04/01/2017  . S/P craniotomy 01/27/2016  . Benign meningioma ([place]) 01/27/2016  . CKD (chronic kidney disease) stage 2, GFR 60-89 ml/min 01/18/2016  . Tobacco abuse 01/18/2016  . Seizures ([place])   . GERD (gastroesophageal reflux disease) 04/13/2013    Past Surgical History:  Procedure Laterality Date  . CLOSED REDUCTION NASAL FRACTURE N/A 06/04/2014   Procedure: CLOSED REDUCTION NASAL FRACTURE;  Surgeon: Ascencion Dike, MD;  Location: [place];  Service: ENT;  Laterality: N/A;  . CRANIOTOMY Right 01/27/2016   Procedure: RIGHT FRONTAL CRANIOTOMY FOR MENINGIOMA;  Surgeon: Kristeen Miss, MD;  Location: [place] NEUR[facility]S;  Service: Neurosurgery;  Laterality: Right;  RIGHT FRONTAL CRANIOTOMY FOR MENINGIOMA  . DIAGNOSTIC LAPAROSCOPY     - stomach  . ESOPHAGOGASTRODUODENOSCOPY ENDOSCOPY         Family History  Problem Relation Age of Onset  . Diabetes Maternal Grandmother   . Depression Maternal Grandmother   . Diabetes Paternal Grandfather   . Hypertension Paternal Grandfather   . Hypertension Father   . Diabetes Father   . Sickle cell trait Father   . Hypertension Mother   . Diabetes Mother   . Sickle  cell trait Mother   . Depression Mother   . Depression Maternal Grandfather   . Psychosis Nephew   . Colon cancer Neg Hx   . Rectal cancer Neg Hx   . Stomach cancer Neg Hx   . Esophageal cancer Neg Hx     Social History   Tobacco Use  . Smoking status: Current Every Day Smoker    Packs/day: 0.25    Years: 20.00    Pack years: 5.00    Types: Cigarettes  . Smokeless tobacco: Never Used  Vaping Use  . Vaping Use: Never used  Substance Use Topics  . Alcohol use: Yes    Alcohol/week: 1.0 standard drink    Types: 1 Cans of beer per week  . Drug use: Yes    Types: Cocaine     Home Medications Prior to Admission medications   Medication Sig Start Date End Date Taking? Authorizing Provider  atorvastatin (LIPITOR) 20 MG tablet Take 1 tablet (20 mg total) by mouth daily. 11/24/20   Ladell Pier, MD  cyclobenzaprine (FLEXERIL) 5 MG tablet TAKE 1 TABLET BY MOUTH THREE TIMES A DAY AS NEEDED FOR MUSCLE SPASMS 09/07/19   Ladell Pier, MD  levETIRAcetam (KEPPRA) 1000 MG tablet Take 1 tablet (1,000 mg total) by mouth 2 (two) times daily. 07/04/19   Ladell Pier, MD    Allergies    Patient has no known allergies.  Review of Systems   Review of Systems  Constitutional: Positive for fever.  HENT: Negative for sore throat.   Respiratory: Positive for cough.   Gastrointestinal: Negative for diarrhea.  Musculoskeletal: Positive for myalgias.  Neurological: Positive for headaches.  All other systems reviewed and are negative.   Physical Exam Updated Vital Signs BP (!) 131/91 (BP Location: Left Arm)   Pulse 82   Temp 99.1 F (37.3 C) (Oral)   Resp 16   Ht 6\' 3"  (1.905 m)   Wt 52.1 kg   SpO2 96%   BMI 14.35 kg/m   Physical Exam Vitals and nursing note reviewed.  Constitutional:      General: He is not in acute distress.    Appearance: He is well-developed and well-nourished. He is not diaphoretic.  HENT:     Head: Normocephalic and atraumatic.     Mouth/Throat:     Mouth: Oropharynx is clear and moist.  Cardiovascular:     Rate and Rhythm: Normal rate and regular rhythm.     Heart sounds: No murmur heard. No friction rub.  Pulmonary:     Effort: Pulmonary effort is normal. No respiratory distress.     Breath sounds: Normal breath sounds. No wheezing or rales.  Abdominal:     General: Bowel sounds are normal. There is no distension.     Palpations: Abdomen is soft.     Tenderness: There is no abdominal tenderness.  Musculoskeletal:        General: No edema. Normal range of motion.     Cervical back: Normal range of motion and neck  supple.  Skin:    General: Skin is warm and dry.  Neurological:     Mental Status: He is alert and oriented to person, place, and time.     Coordination: Coordination normal.     ED Results / Procedures / Treatments   Labs (all labs ordered are listed, but only abnormal results are displayed) Labs Reviewed - No data to display  EKG None  Radiology No results found.  Procedures Procedures  Medications Ordered in ED Medications  ibuprofen (ADVIL) tablet 400 mg (400 mg Oral Given 11/24/20 2119)    ED Course  I have reviewed the triage vital signs and the nursing notes.  Pertinent labs & imaging results that were available during my care of the patient were reviewed by me and considered in my medical decision making (see chart for details).    MDM Rules/Calculators/A&P  Patient sent from [facility] for evaluation of fever.  He has a positive Covid test, but displays normal vital signs and no hypoxia. I see no indication for hospital admission and I suspect [facility] will not allow him back for Covid policy.  At this point, patient will likely have to return home and seek his addiction treatment once his quarantine has expired.  CAVELL CANCEL was evaluated in Emergency Department on 11/25/2020 for the symptoms described in the history of present illness. He was evaluated in the context of the global COVID-19 pandemic, which necessitated consideration that the patient might be at risk for infection with the SARS-CoV-2 virus that causes COVID-19. Institutional protocols and algorithms that pertain to the evaluation of patients at risk for COVID-19 are in a state of rapid change based on information released by regulatory bodies including the CDC and federal and state organizations. These policies and algorithms were followed during the patient's care in the ED.   Final Clinical Impression(s) / ED Diagnoses Final diagnoses:  None    Rx / DC Orders ED Discharge Orders    None        Veryl Speak, MD 11/25/20 [id number]    Veryl Speak, MD 11/25/20 [phone]

## 2020-11-25 NOTE — ED Notes (Signed)
Per [facility], Pt cannot return to facility at this time until pts Quarantine is complete. Pt may not return to pick up personal belongings at this time either (Quarantine must be completed or a representative of the patient can retrieve them). Patient is not currently able to contact any family at this time to provide housing for patient.

## 2020-11-25 NOTE — ED Notes (Signed)
Patient's ride here to pick him up

## 2020-11-25 NOTE — Telephone Encounter (Signed)
Called to discuss with patient about COVID-19 symptoms and the use of one of the available treatments for those with mild to moderate Covid symptoms and at a high risk of hospitalization.  Pt appears to qualify for outpatient treatment due to co-morbid conditions and/or a member of an at-risk group in accordance with the FDA Emergency Use Authorization.    Symptom onset: 11/21/20 Vaccinated: No Booster? No Immunocompromised? No Qualifiers: HTN, CKD, Brain tumor per pt.  Unable to reach pt - Reached pt.  Jeremy Soto

## 2020-11-25 NOTE — ED Notes (Signed)
Patient verbalizes understanding of discharge instructions. Opportunity for questioning and answers were provided. Pt is currently attempting to contact Family as [facility] is not currently accepting COVID Positive individuals.

## 2020-11-25 NOTE — ED Notes (Signed)
Pt made aware of care plan for disposition. Pt agreeable and states he is hopeful that family will be able to provide a safe disposition closer to the later morning.

## 2020-11-25 NOTE — Discharge Instructions (Addendum)
Isolate at home for the next 7 days.  Take Tylenol 1000 mg rotated with ibuprofen 600 mg every 4 hours as needed for pain or fever.  Drink plenty of fluids and get plenty of rest.  Return to the emergency department if you develop severe chest pain, difficulty breathing, or other new and concerning symptoms.

## 2020-11-25 NOTE — ED Notes (Signed)
Pt still currently not able to find a safe disposition if discharged from facility. Pt states he will attempt again in 15-25 minutes. RN will reassess at that

## 2020-11-25 NOTE — ED Notes (Signed)
Pt still had no success in obtaining a safe disposition after discharge. Pt left a voicemail for several parties. Charge RN made aware and recommends pt awaits in Examination Room while patient awaits for return call regarding a safe disposition post-discharge. MD Delo also made aware.

## 2020-11-26 NOTE — Telephone Encounter (Signed)
Pt seen in ER yesterday.  Note reviewed.  Pt dx with COVID. RN tried to reach pt to offer out-pt treatment due to his co-morbid conditions.

## 2020-11-26 NOTE — Telephone Encounter (Signed)
Returned call to [place]. Per Suanne Marker pt left the facility on 11/24/20. Suanne Marker states pt was medically discharged because he had COVID

## 2020-12-19 ENCOUNTER — Inpatient Hospital Stay: Payer: Medicaid Other | Admitting: Internal Medicine

## 2021-06-12 ENCOUNTER — Telehealth: Payer: Self-pay | Admitting: Internal Medicine

## 2021-06-12 NOTE — Telephone Encounter (Signed)
Pt is calling to know what medications that that he is actively taking and dosages. Pt is trying to receive assistance from [facility] and they are wanting this information [phone]

## 2021-06-12 NOTE — Telephone Encounter (Signed)
Called pt made ID confirmed  reviewed meds list w pt

## 2021-06-12 NOTE — Telephone Encounter (Signed)
See encounter

## 2021-11-24 ENCOUNTER — Telehealth: Payer: Self-pay | Admitting: Internal Medicine

## 2021-11-24 NOTE — Telephone Encounter (Signed)
[facility] [address] [place], [place] [address] Phone: [phone]

## 2021-11-24 NOTE — Telephone Encounter (Signed)
Patient called would like to know if there is a GI provider he can see in [facility] because he relocated there.

## 2021-11-24 NOTE — Telephone Encounter (Signed)
Pt called and states he has moved to [facility]. He wants to know if Dr. Hilarie Fredrickson knows of a GI practice to recommend. Please advise.

## 2021-11-25 NOTE — Telephone Encounter (Signed)
Spoke with pt and he is aware of Dr. Vena Rua recommendation.
